# Patient Record
Sex: Male | Born: 1935
Health system: Southern US, Community
[De-identification: ages and names within clinical notes are randomized; demographics above are authoritative.]

## PROBLEM LIST (undated history)

## (undated) DIAGNOSIS — I10 Essential (primary) hypertension: Secondary | ICD-10-CM

## (undated) DIAGNOSIS — C801 Malignant (primary) neoplasm, unspecified: Secondary | ICD-10-CM

## (undated) DIAGNOSIS — I4891 Unspecified atrial fibrillation: Secondary | ICD-10-CM

## (undated) HISTORY — DX: Unspecified atrial fibrillation: I48.91

---

## 2018-03-30 HISTORY — PX: ESOPHAGOGASTRODUODENOSCOPY: SHX1529

## 2019-04-21 HISTORY — PX: TRANSURETHRAL RESECTION OF PROSTATE: SHX73

## 2019-09-01 ENCOUNTER — Other Ambulatory Visit: Payer: Self-pay

## 2019-09-01 ENCOUNTER — Emergency Department (HOSPITAL_COMMUNITY): Payer: Medicare Other

## 2019-09-01 ENCOUNTER — Encounter (HOSPITAL_COMMUNITY): Payer: Self-pay | Admitting: Emergency Medicine

## 2019-09-01 ENCOUNTER — Inpatient Hospital Stay (HOSPITAL_COMMUNITY)
Admission: EM | Admit: 2019-09-01 | Discharge: 2019-09-07 | DRG: 177 | Disposition: A | Discharge status: Home Health | Payer: Medicare Other | Attending: Internal Medicine | Admitting: Internal Medicine

## 2019-09-01 DIAGNOSIS — J189 Pneumonia, unspecified organism: Secondary | ICD-10-CM

## 2019-09-01 DIAGNOSIS — R0600 Dyspnea, unspecified: Secondary | ICD-10-CM

## 2019-09-01 DIAGNOSIS — Z79899 Other long term (current) drug therapy: Secondary | ICD-10-CM | POA: Diagnosis not present

## 2019-09-01 DIAGNOSIS — Z885 Allergy status to narcotic agent status: Secondary | ICD-10-CM | POA: Diagnosis not present

## 2019-09-01 DIAGNOSIS — Z7901 Long term (current) use of anticoagulants: Secondary | ICD-10-CM

## 2019-09-01 DIAGNOSIS — D509 Iron deficiency anemia, unspecified: Secondary | ICD-10-CM | POA: Diagnosis present

## 2019-09-01 DIAGNOSIS — Z85828 Personal history of other malignant neoplasm of skin: Secondary | ICD-10-CM

## 2019-09-01 DIAGNOSIS — R0902 Hypoxemia: Secondary | ICD-10-CM

## 2019-09-01 DIAGNOSIS — E039 Hypothyroidism, unspecified: Secondary | ICD-10-CM | POA: Diagnosis present

## 2019-09-01 DIAGNOSIS — Z87891 Personal history of nicotine dependence: Secondary | ICD-10-CM

## 2019-09-01 DIAGNOSIS — M109 Gout, unspecified: Secondary | ICD-10-CM | POA: Diagnosis present

## 2019-09-01 DIAGNOSIS — J9601 Acute respiratory failure with hypoxia: Secondary | ICD-10-CM | POA: Diagnosis present

## 2019-09-01 DIAGNOSIS — J1282 Pneumonia due to coronavirus disease 2019: Secondary | ICD-10-CM | POA: Diagnosis not present

## 2019-09-01 DIAGNOSIS — I4891 Unspecified atrial fibrillation: Secondary | ICD-10-CM | POA: Diagnosis present

## 2019-09-01 DIAGNOSIS — I10 Essential (primary) hypertension: Secondary | ICD-10-CM

## 2019-09-01 DIAGNOSIS — U071 COVID-19: Secondary | ICD-10-CM | POA: Diagnosis not present

## 2019-09-01 DIAGNOSIS — I480 Paroxysmal atrial fibrillation: Secondary | ICD-10-CM | POA: Diagnosis present

## 2019-09-01 HISTORY — DX: Essential (primary) hypertension: I10

## 2019-09-01 HISTORY — DX: Malignant (primary) neoplasm, unspecified: C80.1

## 2019-09-01 LAB — CBC WITH DIFFERENTIAL/PLATELET
Abs Immature Granulocytes: 0.07 10*3/uL (ref 0.00–0.07)
Basophils Absolute: 0 10*3/uL (ref 0.0–0.1)
Basophils Relative: 0 %
Eosinophils Absolute: 0 10*3/uL (ref 0.0–0.5)
Eosinophils Relative: 0 %
HCT: 31.4 % — ABNORMAL LOW (ref 39.0–52.0)
Hemoglobin: 9.5 g/dL — ABNORMAL LOW (ref 13.0–17.0)
Immature Granulocytes: 1 %
Lymphocytes Relative: 6 %
Lymphs Abs: 0.6 10*3/uL — ABNORMAL LOW (ref 0.7–4.0)
MCH: 24.8 pg — ABNORMAL LOW (ref 26.0–34.0)
MCHC: 30.3 g/dL (ref 30.0–36.0)
MCV: 82 fL (ref 80.0–100.0)
Monocytes Absolute: 0.2 10*3/uL (ref 0.1–1.0)
Monocytes Relative: 2 %
Neutro Abs: 9.7 10*3/uL — ABNORMAL HIGH (ref 1.7–7.7)
Neutrophils Relative %: 91 %
Platelets: 224 10*3/uL (ref 150–400)
RBC: 3.83 MIL/uL — ABNORMAL LOW (ref 4.22–5.81)
RDW: 16.3 % — ABNORMAL HIGH (ref 11.5–15.5)
WBC: 10.7 10*3/uL — ABNORMAL HIGH (ref 4.0–10.5)
nRBC: 0 % (ref 0.0–0.2)

## 2019-09-01 LAB — FIBRINOGEN: Fibrinogen: 722 mg/dL — ABNORMAL HIGH (ref 210–475)

## 2019-09-01 LAB — COMPREHENSIVE METABOLIC PANEL
ALT: 22 U/L (ref 0–44)
AST: 43 U/L — ABNORMAL HIGH (ref 15–41)
Albumin: 3.9 g/dL (ref 3.5–5.0)
Alkaline Phosphatase: 128 U/L — ABNORMAL HIGH (ref 38–126)
Anion gap: 10 (ref 5–15)
BUN: 13 mg/dL (ref 8–23)
CO2: 24 mmol/L (ref 22–32)
Calcium: 8.7 mg/dL — ABNORMAL LOW (ref 8.9–10.3)
Chloride: 97 mmol/L — ABNORMAL LOW (ref 98–111)
Creatinine, Ser: 0.89 mg/dL (ref 0.61–1.24)
GFR calc Af Amer: >60 mL/min (ref >60)
GFR calc non Af Amer: >60 mL/min (ref >60)
Glucose, Bld: 101 mg/dL — ABNORMAL HIGH (ref 70–99)
Potassium: 3.5 mmol/L (ref 3.5–5.1)
Sodium: 131 mmol/L — ABNORMAL LOW (ref 135–145)
Total Bilirubin: 0.8 mg/dL (ref 0.3–1.2)
Total Protein: 7.5 g/dL (ref 6.5–8.1)

## 2019-09-01 LAB — PROCALCITONIN: Procalcitonin: 0.28 ng/mL

## 2019-09-01 LAB — D-DIMER, QUANTITATIVE: D-Dimer, Quant: 2.92 ug/mL-FEU — ABNORMAL HIGH (ref 0.00–0.50)

## 2019-09-01 LAB — C-REACTIVE PROTEIN: CRP: 25.8 mg/dL — ABNORMAL HIGH (ref <1.0)

## 2019-09-01 LAB — RESPIRATORY PANEL BY RT PCR (FLU A&B, COVID)
Influenza A by PCR: NEGATIVE
Influenza B by PCR: NEGATIVE
SARS Coronavirus 2 by RT PCR: POSITIVE — AB

## 2019-09-01 LAB — LACTIC ACID, PLASMA
Lactic Acid, Venous: 1.4 mmol/L (ref 0.5–1.9)
Lactic Acid, Venous: 1.2 mmol/L (ref 0.5–1.9)

## 2019-09-01 LAB — FERRITIN: Ferritin: 59 ng/mL (ref 24–336)

## 2019-09-01 LAB — LACTATE DEHYDROGENASE: LDH: 226 U/L — ABNORMAL HIGH (ref 98–192)

## 2019-09-01 LAB — TRIGLYCERIDES: Triglycerides: 51 mg/dL (ref <150)

## 2019-09-01 MED ORDER — AZITHROMYCIN 250 MG PO TABS
500.0000 mg | ORAL_TABLET | Freq: Once | ORAL | Status: AC
  Administered 2019-09-01: 500 mg via ORAL
  Filled 2019-09-01: qty 2

## 2019-09-01 MED ORDER — VANCOMYCIN HCL 2000 MG/400ML IV SOLN
2000.0000 mg | Freq: Once | INTRAVENOUS | Status: AC
  Administered 2019-09-02: 2000 mg via INTRAVENOUS
  Filled 2019-09-01: qty 400

## 2019-09-01 MED ORDER — POLYETHYLENE GLYCOL 3350 17 G PO PACK: 17.0000 g | PACK | Freq: Every day | ORAL | Status: DC | PRN

## 2019-09-01 MED ORDER — LEVOTHYROXINE SODIUM 50 MCG PO TABS
50.0000 ug | ORAL_TABLET | Freq: Every day | ORAL | Status: DC
  Administered 2019-09-02 – 2019-09-07 (×6): 50 ug via ORAL
  Filled 2019-09-01 (×6): qty 1

## 2019-09-01 MED ORDER — ENSURE ENLIVE PO LIQD
237.0000 mL | Freq: Two times a day (BID) | ORAL | Status: DC
  Administered 2019-09-02 (×2): 237 mL via ORAL

## 2019-09-01 MED ORDER — PANTOPRAZOLE SODIUM 40 MG PO TBEC
40.0000 mg | DELAYED_RELEASE_TABLET | Freq: Every day | ORAL | Status: DC
  Administered 2019-09-02 – 2019-09-07 (×6): 40 mg via ORAL
  Filled 2019-09-01 (×6): qty 1

## 2019-09-01 MED ORDER — ZINC SULFATE 220 (50 ZN) MG PO CAPS
220.0000 mg | ORAL_CAPSULE | Freq: Every day | ORAL | Status: DC
  Administered 2019-09-02 – 2019-09-07 (×6): 220 mg via ORAL
  Filled 2019-09-01 (×6): qty 1

## 2019-09-01 MED ORDER — PROMETHAZINE HCL 25 MG PO TABS
12.5000 mg | ORAL_TABLET | Freq: Four times a day (QID) | ORAL | Status: DC | PRN
  Filled 2019-09-01: qty 1

## 2019-09-01 MED ORDER — RIVAROXABAN 20 MG PO TABS
20.0000 mg | ORAL_TABLET | Freq: Every day | ORAL | Status: DC
  Administered 2019-09-02 – 2019-09-06 (×5): 20 mg via ORAL
  Filled 2019-09-01 (×7): qty 1

## 2019-09-01 MED ORDER — ACETAMINOPHEN 500 MG PO TABS
1000.0000 mg | ORAL_TABLET | Freq: Once | ORAL | Status: AC
  Administered 2019-09-01: 1000 mg via ORAL
  Filled 2019-09-01: qty 2

## 2019-09-01 MED ORDER — DM-GUAIFENESIN ER 30-600 MG PO TB12
1.0000 | ORAL_TABLET | Freq: Two times a day (BID) | ORAL | Status: DC
  Administered 2019-09-01 – 2019-09-07 (×12): 1 via ORAL
  Filled 2019-09-01 (×13): qty 1

## 2019-09-01 MED ORDER — SODIUM CHLORIDE 0.9 % IV SOLN
1.0000 g | Freq: Once | INTRAVENOUS | Status: AC
  Administered 2019-09-01: 17:00:00 1 g via INTRAVENOUS
  Filled 2019-09-01: qty 10

## 2019-09-01 MED ORDER — ALLOPURINOL 100 MG PO TABS
300.0000 mg | ORAL_TABLET | Freq: Every day | ORAL | Status: DC
  Administered 2019-09-02 – 2019-09-07 (×6): 300 mg via ORAL
  Filled 2019-09-01 (×7): qty 3

## 2019-09-01 MED ORDER — SODIUM CHLORIDE 0.9 % IV SOLN
100.0000 mg | Freq: Every day | INTRAVENOUS | Status: AC
  Administered 2019-09-02 – 2019-09-05 (×4): 100 mg via INTRAVENOUS
  Filled 2019-09-01 (×5): qty 20

## 2019-09-01 MED ORDER — ASCORBIC ACID 500 MG PO TABS
500.0000 mg | ORAL_TABLET | Freq: Every day | ORAL | Status: DC
  Administered 2019-09-02 – 2019-09-07 (×6): 500 mg via ORAL
  Filled 2019-09-01 (×6): qty 1

## 2019-09-01 MED ORDER — DEXAMETHASONE SODIUM PHOSPHATE 10 MG/ML IJ SOLN
10.0000 mg | Freq: Once | INTRAMUSCULAR | Status: AC
  Administered 2019-09-01: 10 mg via INTRAVENOUS
  Filled 2019-09-01: qty 1

## 2019-09-01 MED ORDER — ALBUTEROL SULFATE HFA 108 (90 BASE) MCG/ACT IN AERS
2.0000 | INHALATION_SPRAY | Freq: Four times a day (QID) | RESPIRATORY_TRACT | Status: DC
  Administered 2019-09-02: 2 via RESPIRATORY_TRACT
  Filled 2019-09-01: qty 6.7

## 2019-09-01 MED ORDER — SODIUM CHLORIDE 0.9 % IV SOLN
2.0000 g | Freq: Three times a day (TID) | INTRAVENOUS | Status: DC
  Administered 2019-09-01 – 2019-09-02 (×2): 2 g via INTRAVENOUS
  Filled 2019-09-01 (×2): qty 2

## 2019-09-01 MED ORDER — SODIUM CHLORIDE 0.9 % IV SOLN
200.0000 mg | Freq: Once | INTRAVENOUS | Status: AC
  Administered 2019-09-01: 19:00:00 200 mg via INTRAVENOUS
  Filled 2019-09-01: qty 40

## 2019-09-01 MED ORDER — DEXAMETHASONE SODIUM PHOSPHATE 10 MG/ML IJ SOLN
6.0000 mg | INTRAMUSCULAR | Status: DC
  Administered 2019-09-02 – 2019-09-06 (×5): 6 mg via INTRAVENOUS
  Filled 2019-09-01 (×6): qty 1

## 2019-09-01 MED ORDER — TOCILIZUMAB 400 MG/20ML IV SOLN
700.0000 mg | Freq: Once | INTRAVENOUS | Status: AC
  Administered 2019-09-01: 22:00:00 700 mg via INTRAVENOUS
  Filled 2019-09-01: qty 35

## 2019-09-01 MED ORDER — AMLODIPINE BESYLATE 10 MG PO TABS
10.0000 mg | ORAL_TABLET | Freq: Every day | ORAL | Status: DC
  Administered 2019-09-02 – 2019-09-07 (×6): 10 mg via ORAL
  Filled 2019-09-01 (×6): qty 1
  Filled 2019-09-01: qty 2

## 2019-09-01 NOTE — ED Notes (Signed)
Patient O2 at 84 to 86% on 4 L via [place].  Dr. Tomi Bamberger informed.

## 2019-09-01 NOTE — Progress Notes (Signed)
Mr. Lucchese was transferred to [facility] room 167 this evening at 2140.  No acute changes en route per [facility].  Upon arrival, [name] was on NRB.  O2 titrated down to 5 L [place] and resting comfortably in bed.

## 2019-09-01 NOTE — ED Notes (Signed)
Have paged respiratory due to low O2 sats. 84-86% on 4L [place].

## 2019-09-01 NOTE — ED Provider Notes (Signed)
[facility] EMERGENCY DEPARTMENT Provider Note   CSN: [id number] Arrival date & time: 09/01/19  1356     History Chief Complaint  Patient presents with  . Fever  . Shortness of Breath    Steven Gutierrez is a 86 y.o. male.  HPI   Pt presents with worsening shortness of breath, fever, cough.  Pt states he started having symptom over a week ago.  Initially it was cough, nasal congestion.  His sx increased so he was seen at a hospital in [place].  He was diagnosed with pna and tested negative for covid.  Pt states today he felt worse.  He could only walk a few steps without getting short of breath.  He also has pain with coughing.  No vomiting or diarrhea.  No use of oxygen at home.  No hx of lung problems.  Past Medical History:  Diagnosis Date  . Cancer ([place])    skin cancer  . Hypertension     Patient Active Problem List   Diagnosis Date Noted  . Pneumonia due to COVID-19 virus 09/01/2019    History reviewed. No pertinent surgical history.     No family history on file.  Social History   Tobacco Use  . Smoking status: Former Research scientist (life sciences)  . Smokeless tobacco: Never Used  Substance Use Topics  . Alcohol use: Not Currently  . Drug use: Never    Home Medications Prior to Admission medications   Not on File    Allergies    Oxycodone  Review of Systems   Review of Systems  All other systems reviewed and are negative.   Physical Exam Updated Vital Signs BP 136/68   Pulse 88   Temp (!) 100.8 F (38.2 C) (Oral)   Resp (!) 32   Ht 1.829 m (6')   Wt 88.5 kg   SpO2 93%   BMI 26.45 kg/m   Physical Exam Vitals and nursing note reviewed.  Constitutional:      Appearance: He is well-developed. He is not toxic-appearing or diaphoretic.  HENT:     Head: Normocephalic and atraumatic.     Right Ear: External ear normal.     Left Ear: External ear normal.  Eyes:     General: No scleral icterus.       Right eye: No discharge.        Left eye: No discharge.   Conjunctiva/sclera: Conjunctivae normal.  Neck:     Trachea: No tracheal deviation.  Cardiovascular:     Rate and Rhythm: Normal rate and regular rhythm.  Pulmonary:     Effort: Pulmonary effort is normal. No respiratory distress.     Breath sounds: Normal breath sounds. No stridor. No wheezing or rales.  Abdominal:     General: Bowel sounds are normal. There is no distension.     Palpations: Abdomen is soft.     Tenderness: There is no abdominal tenderness. There is no guarding or rebound.  Musculoskeletal:        General: No tenderness.     Cervical back: Neck supple.  Skin:    General: Skin is warm and dry.     Findings: No rash.  Neurological:     Mental Status: He is alert.     Cranial Nerves: No cranial nerve deficit (no facial droop, extraocular movements intact, no slurred speech).     Sensory: No sensory deficit.     Motor: No abnormal muscle tone or seizure activity.     Coordination: Coordination  normal.     ED Results / Procedures / Treatments   Labs (all labs ordered are listed, but only abnormal results are displayed) Labs Reviewed  RESPIRATORY PANEL BY RT PCR (FLU A&B, COVID) - Abnormal; Notable for the following components:      Result Value   SARS Coronavirus 2 by RT PCR POSITIVE (*)    All other components within normal limits  CBC WITH DIFFERENTIAL/PLATELET - Abnormal; Notable for the following components:   WBC 10.7 (*)    RBC 3.83 (*)    Hemoglobin 9.5 (*)    HCT 31.4 (*)    MCH 24.8 (*)    RDW 16.3 (*)    Neutro Abs 9.7 (*)    Lymphs Abs 0.6 (*)    All other components within normal limits  COMPREHENSIVE METABOLIC PANEL - Abnormal; Notable for the following components:   Sodium 131 (*)    Chloride 97 (*)    Glucose, Bld 101 (*)    Calcium 8.7 (*)    AST 43 (*)    Alkaline Phosphatase 128 (*)    All other components within normal limits  D-DIMER, QUANTITATIVE (NOT AT [facility]) - Abnormal; Notable for the following components:   D-Dimer, Quant  2.92 (*)    All other components within normal limits  LACTATE DEHYDROGENASE - Abnormal; Notable for the following components:   LDH 226 (*)    All other components within normal limits  FIBRINOGEN - Abnormal; Notable for the following components:   Fibrinogen 722 (*)    All other components within normal limits  C-REACTIVE PROTEIN - Abnormal; Notable for the following components:   CRP 25.8 (*)    All other components within normal limits  CULTURE, BLOOD (ROUTINE X 2)  CULTURE, BLOOD (ROUTINE X 2)  LACTIC ACID, PLASMA  LACTIC ACID, PLASMA  PROCALCITONIN  FERRITIN  TRIGLYCERIDES    EKG EKG Interpretation  Date/Time:  Tuesday September 01 2019 14:11:13 EST Ventricular Rate:  79 PR Interval:    QRS Duration: 103 QT Interval:  420 QTC Calculation: 482 R Axis:   78 Text Interpretation: Undetermined rhythm RSR' in V1 or V2, probably normal variant Borderline prolonged QT interval No old tracing to compare Confirmed by Dorie Rank [phone]) on 09/01/2019 5:01:34 PM   Radiology DG Chest Port 1 View  Result Date: 09/01/2019 CLINICAL DATA:  Dyspnea. EXAM: PORTABLE CHEST 1 VIEW COMPARISON:  None. FINDINGS: Moderate severity patchy infiltrate is seen within the mid left lung with mild infiltrates seen within the mid to lower right lung and left lung base. There is no evidence of a pleural effusion or pneumothorax. The cardiac silhouette is borderline in size. There is moderate severity calcification of the aortic arch. The visualized skeletal structures are unremarkable. IMPRESSION: 1. Moderate severity bilateral infiltrates, left greater than right. Electronically Signed   By: Virgina Norfolk M.D.   On: 09/01/2019 15:56    Procedures .Critical Care Performed by: Dorie Rank, MD Authorized by: Dorie Rank, MD   Critical care provider statement:    Critical care time (minutes):  45   Critical care was time spent personally by me on the following activities:  Discussions with consultants,  evaluation of patient's response to treatment, examination of patient, ordering and performing treatments and interventions, ordering and review of laboratory studies, ordering and review of radiographic studies, pulse oximetry, re-evaluation of patient's condition, obtaining history from patient or surrogate and review of old charts   (including critical care time)  Medications Ordered  in ED Medications  dexamethasone (DECADRON) injection 10 mg (has no administration in time range)  cefTRIAXone (ROCEPHIN) 1 g in sodium chloride 0.9 % 100 mL IVPB (0 g Intravenous Stopped 09/01/19 1705)  azithromycin (ZITHROMAX) tablet 500 mg (500 mg Oral Given 09/01/19 1636)  acetaminophen (TYLENOL) tablet 1,000 mg (1,000 mg Oral Given 09/01/19 1826)    ED Course  I have reviewed the triage vital signs and the nursing notes.  Pertinent labs & imaging results that were available during my care of the patient were reviewed by me and considered in my medical decision making (see chart for details).  Clinical Course as of Aug 31 1825  Tue Sep 01, 2019  1523 Vitals reviewed.  O2 sat decreased to 88%.  Improved with [place] oxygen   [JK]  1700 Chest x-ray shows bilateral infiltrates.   [JK]  1700 Labs notable for leukocytosis and anemia.  Inflammatory markers fibrinogen, D-dimer and LDH elevated   [JK]  1826 Covid test positive.  Decadron and remdesivir ordered.  Case discussed with Dr Denton Brick   [JK]    Clinical Course User Index [JK] Dorie Rank, MD   MDM Rules/Calculators/A&P                     Patient presented to the ED for evaluation of pneumonia and persistent shortness of breath.  Patient was tested for Covid and was told he was negative.  He has bilateral pneumonia in a pattern that is concerning for Covid pneumonia.  I have repeated his test here.  Inflammatory markers are elevated consistent with his infection.  I doubt pulmonary embolism based on his presentation and his abnormal chest x-ray finding would  account for his hypoxia.  Patient was started on empiric antibiotics.  He does continue to have an oxygen requirement and is in the high 80s on 4 L of nasal cannula oxygen.  We will continue to monitor.  I will consult with the medical service for admission and further treatment. Final Clinical Impression(s) / ED Diagnoses Final diagnoses:  Pneumonia of both lungs due to infectious organism, unspecified part of lung  Hypoxia  Pneumonia due to COVID-19 virus      Dorie Rank, MD 09/01/19 1827

## 2019-09-01 NOTE — ED Triage Notes (Signed)
Patient states shortness of breath and fever x 4 days. States diagnosed with pneumonia a week ago and finished antibiotics this morning. States he can only take a few steps with becoming winded. States fever and shortness of breath have increased over the past 2 days.

## 2019-09-01 NOTE — ED Notes (Signed)
Patient give permission to share all information with his son Harbert Demichele [id number].  Family updated.

## 2019-09-01 NOTE — Progress Notes (Signed)
Pharmacy Antibiotic Note  Steven Gutierrez is a 86 y.o. male admitted on 09/01/2019 withCoVid-19 pneumonia.  Pharmacy has been consulted for vancomycin and cefepime dosing.  Plan: Start cefepime 2g IV q8h Loading dose:  vancomycin 2g  IV x1 dose Maintenance dose: vancomycin  1g IV  q12 h Calculated AUC: 457.6 mcg*h/mL Pharmacy will continue to monitor renal function, vancomycin levels as clinically appropriate,  cultures and patient progress.  Height: 6' (182.9 cm) Weight: 195 lb (88.5 kg) IBW/kg (Calculated) : 77.6  Temp (24hrs), Avg:100.8 F (38.2 C), Min:100.8 F (38.2 C), Max:100.8 F (38.2 C)  Recent Labs  Lab 09/01/19 1522 09/01/19 1523 09/01/19 1709  WBC  --  10.7*  --   CREATININE  --  0.89  --   LATICACIDVEN 1.4  --  1.2    Estimated Creatinine Clearance: 67.8 mL/min (by C-G formula based on SCr of 0.89 mg/dL).    Allergies  Allergen Reactions  . Oxycodone Nausea And Vomiting    Antimicrobials this admission: Vancomycin 2/2 >>   Cefepime 2/2>>   Ceftriaxone 2/2>>2/2 Azithromycin 2/2>>2/2 remdesivir 2/2>>2/6 Actemra 2/2>>   Microbiology results: 2/2 BC x2:  2/2 Resp PCR: SARS CoV-2 positive; Flu A/B negative 2/2 MRSA PCR:    Thank you for allowing pharmacy to be a part of this patient's care.  Despina Pole 09/01/2019 10:49 PM

## 2019-09-01 NOTE — ED Notes (Signed)
Date and time results received: 09/01/19 1811 (use smartphrase ".now" to insert current time)  Test: COVID Critical Value:  POSITIVE  Name of Provider Notified: Tomi Bamberger  Orders Received? Or Actions Taken?: Orders Received - See Orders for details

## 2019-09-01 NOTE — ED Notes (Signed)
Pt changed from HF [place] to 15L non rebreather per MD order for transport to [facility].

## 2019-09-01 NOTE — H&P (Signed)
History and Physical    Steven Gutierrez [id number] DOB: Dec 31, 1932 DOA: 09/01/2019  PCP: [name], MD   Patient coming from: Home  I have personally briefly reviewed patient's old medical records in [place]  Chief Complaint: SOB, Cough  HPI: Steven Gutierrez is a 86 y.o. male with medical history significant for atrial fibrillation, hypertension, skin cancer.  Presented to the ED with complaints of difficulty breathing, fevers with cough and nasal congestion, progressive over the past week.  He presented to a [place], was diagnosed with pneumonia and tested negative for Covid.  He was given some antibiotics he does not know the name. Patient lives alone.  I talked to patient's son Chan Boggs, patient went to [facility] ED in [place] on Monday- Jan 25th, for left side pain was diagnosed with pleurisy and told he had fluid in both lungs.  Covid test was negative. He was started on Augmentin, which patient's took twice a day last dose was today.  On Sunday- 1/31, he was back in the same ED with complaints of fevers, difficulty breathing and cough productive of yellowish phlegm.  Was told he had pneumonia and to complete his antibiotics.  ED Course: Temperature 100.8.  Tachycardic to 34, O2 sats initially 88% on room air, but with increasing hypoxia patient was placed on high flow nasal cannula 11 L.  Procalcitonin elevated at 0.28.  Elevation in inflammatory markers LDH, CRP 25.8, fibrinogen and D-dimer.  Respiratory panel positive for COVID-19 infection negative for influenza.  Chest x-ray showed moderate severity bilateral infiltrates L  > R.  IV ceftriaxone and azithromycin started in ED.  Hospitalist to admit for COVID-19 pneumonia.  Review of Systems: As per HPI all other systems reviewed and negative.  Past Medical History:  Diagnosis Date  . Cancer ([place])    skin cancer  . Hypertension     History reviewed. No pertinent surgical history.   reports that he has quit  smoking. He has never used smokeless tobacco. He reports previous alcohol use. He reports that he does not use drugs.  Allergies  Allergen Reactions  . Oxycodone Nausea And Vomiting   Family history of hypertension.  Prior to Admission medications   Not on File    Physical Exam: Vitals:   09/01/19 1915 09/01/19 1930 09/01/19 1945 09/01/19 2000  BP:  130/81  118/74  Pulse: 96 95 95   Resp: (!) 31 (!) 22 19 (!) 23  Temp:      TempSrc:      SpO2: 96% 93% 92%   Weight:      Height:        Constitutional: NAD, calm, comfortable Vitals:   09/01/19 1915 09/01/19 1930 09/01/19 1945 09/01/19 2000  BP:  130/81  118/74  Pulse: 96 95 95   Resp: (!) 31 (!) 22 19 (!) 23  Temp:      TempSrc:      SpO2: 96% 93% 92%   Weight:      Height:       Eyes: PERRL, lids and conjunctivae normal ENMT: Mucous membranes are moist. Posterior pharynx clear of any exudate or lesions. Neck: normal, supple, no masses, no thyromegaly Respiratory: Normal respiratory effort. No accessory muscle use, Considering the degree of hypoxia Cardiovascular: Regular rate and rhythm, No extremity edema. 2+ pedal pulses.  Abdomen: no tenderness, no masses palpated. No hepatosplenomegaly. Bowel sounds positive.  Musculoskeletal: no clubbing / cyanosis. No joint deformity upper and lower extremities.  Skin: no  rashes, lesions, ulcers. No induration Neurologic: No gross cranial nerve abnormality, moving all extremities spontaneously. Psychiatric: Normal judgment and insight. Alert and oriented x 3. Normal mood.   Labs on Admission: I have personally reviewed following labs and imaging studies  CBC: Recent Labs  Lab 09/01/19 1523  WBC 10.7*  NEUTROABS 9.7*  HGB 9.5*  HCT 31.4*  MCV 82.0  PLT [id number]   Basic Metabolic Panel: Recent Labs  Lab 09/01/19 1523  NA 131*  K 3.5  CL 97*  CO2 24  GLUCOSE 101*  BUN 13  CREATININE 0.89  CALCIUM 8.7*   Liver Function Tests: Recent Labs  Lab 09/01/19 1523    AST 43*  ALT 22  ALKPHOS 128*  BILITOT 0.8  PROT 7.5  ALBUMIN 3.9   Lipid Profile: Recent Labs    09/01/19 1523  TRIG 51   Anemia Panel: Recent Labs    09/01/19 1523  FERRITIN 59    Radiological Exams on Admission: DG Chest Port 1 View  Result Date: 09/01/2019 CLINICAL DATA:  Dyspnea. EXAM: PORTABLE CHEST 1 VIEW COMPARISON:  None. FINDINGS: Moderate severity patchy infiltrate is seen within the mid left lung with mild infiltrates seen within the mid to lower right lung and left lung base. There is no evidence of a pleural effusion or pneumothorax. The cardiac silhouette is borderline in size. There is moderate severity calcification of the aortic arch. The visualized skeletal structures are unremarkable. IMPRESSION: 1. Moderate severity bilateral infiltrates, left greater than right. Electronically Signed   By: Virgina Norfolk M.D.   On: 09/01/2019 15:56    EKG: Independently reviewed.  No discernible P waves, rhythm irregular.  No prior EKG to compare.  Care everywhere history of atrial fibrillation.  Assessment/Plan Active Problems:   Pneumonia due to COVID-19 virus    Acute respiratory failure 2/2 COVID-19 pneumonia- O2 sats initially  84 - 86% on 4 L nasal cannula, currently on high flow nasal cannula 11 L.  Chest x-ray showing bilateral infiltrates L > R.  Elevation in inflammatory markers-CRP 25.8.  Ferritin, LDH, D-dimer.  Procalcitonin 0.28.  Completed course of Augmentin. -IV dexamethasone 10 mg x 1, continue 6 mg daily -Remdesivir pharmacy to dose -Daily inflammatory markers, CBC, CMP -Can order rule out persistent or new bacterial coinfection, will start IV vancomycin and cefepime -Follow-up blood cultures -As needed albuterol inhaler, mucolytics -COVID-19 admission protocol -Respiratory therapy consult -Considering age, chest x-ray findings and degree of hypoxia, prognosis is poor. -Consents received from patient and from patient's son, to give  Actemra.  Atrial fibrillation- rate controlled and on anticoagulation with Xarelto.  Not on rate limiting drugs. -Resume home Xarelto  Hypertension-stable. -Resume home Norvasc 10 mg daily  Hx of Gout -Resume home allopurinol 300 mg daily  Hypothyroidism -Resume home Synthroid 38mcg daily  DVT prophylaxis: Xarelto Code Status: Full code-confirmed with patient at bedside and also with patient's son- Bark[place], Cl[place] on the phone. Family Communication: Talked to patient's older son Derico, Mccrea who is primary decision-maker, unsure if he has signed documents indicating HCPOA.  Contact Number [phone].  I have explained severity of illness, likelihood of further decompensation, poor prognosis. Disposition Plan: Pending improvement in respiratory status. Consults called: None Admission status: InPatient, stepdown I certify that at the point of admission it is my clinical judgment that the patient will require inpatient hospital care spanning beyond 2 midnights from the point of admission due to high intensity of service, high risk for further deterioration and high  frequency of surveillance required. The following factors support the patient status of inpatient: Acute respiratory failure requiring high amounts of supplemental O2.   Bethena Roys MD [facility]  09/01/2019, 8:43 PM

## 2019-09-01 NOTE — Progress Notes (Signed)
Called patient's son [name] to let him now that the patient has arrived and has been settled.  Currently sats are 94% on 5L [place] and he is comfortable in the bed.

## 2019-09-02 LAB — COMPREHENSIVE METABOLIC PANEL
ALT: 21 U/L (ref 0–44)
AST: 40 U/L (ref 15–41)
Albumin: 3.5 g/dL (ref 3.5–5.0)
Alkaline Phosphatase: 113 U/L (ref 38–126)
Anion gap: 12 (ref 5–15)
BUN: 16 mg/dL (ref 8–23)
CO2: 23 mmol/L (ref 22–32)
Calcium: 8.7 mg/dL — ABNORMAL LOW (ref 8.9–10.3)
Chloride: 102 mmol/L (ref 98–111)
Creatinine, Ser: 1.12 mg/dL (ref 0.61–1.24)
GFR calc Af Amer: >60 mL/min (ref >60)
GFR calc non Af Amer: 60 mL/min — ABNORMAL LOW (ref >60)
Glucose, Bld: 172 mg/dL — ABNORMAL HIGH (ref 70–99)
Potassium: 3.8 mmol/L (ref 3.5–5.1)
Sodium: 137 mmol/L (ref 135–145)
Total Bilirubin: 0.7 mg/dL (ref 0.3–1.2)
Total Protein: 7.3 g/dL (ref 6.5–8.1)

## 2019-09-02 LAB — CBC WITH DIFFERENTIAL/PLATELET
Abs Immature Granulocytes: 0.09 K/uL — ABNORMAL HIGH (ref 0.00–0.07)
Basophils Absolute: 0 K/uL (ref 0.0–0.1)
Basophils Relative: 0 %
Eosinophils Absolute: 0 K/uL (ref 0.0–0.5)
Eosinophils Relative: 0 %
HCT: 31.1 % — ABNORMAL LOW (ref 39.0–52.0)
Hemoglobin: 9.4 g/dL — ABNORMAL LOW (ref 13.0–17.0)
Immature Granulocytes: 1 %
Lymphocytes Relative: 4 %
Lymphs Abs: 0.4 K/uL — ABNORMAL LOW (ref 0.7–4.0)
MCH: 24.7 pg — ABNORMAL LOW (ref 26.0–34.0)
MCHC: 30.2 g/dL (ref 30.0–36.0)
MCV: 81.6 fL (ref 80.0–100.0)
Monocytes Absolute: 0.1 K/uL (ref 0.1–1.0)
Monocytes Relative: 1 %
Neutro Abs: 9 K/uL — ABNORMAL HIGH (ref 1.7–7.7)
Neutrophils Relative %: 94 %
Platelets: 234 K/uL (ref 150–400)
RBC: 3.81 MIL/uL — ABNORMAL LOW (ref 4.22–5.81)
RDW: 16.3 % — ABNORMAL HIGH (ref 11.5–15.5)
WBC: 9.7 K/uL (ref 4.0–10.5)
nRBC: 0 % (ref 0.0–0.2)

## 2019-09-02 LAB — D-DIMER, QUANTITATIVE: D-Dimer, Quant: 2.37 ug{FEU}/mL — ABNORMAL HIGH (ref 0.00–0.50)

## 2019-09-02 LAB — C-REACTIVE PROTEIN: CRP: 24.6 mg/dL — ABNORMAL HIGH

## 2019-09-02 LAB — MRSA PCR SCREENING: MRSA by PCR: NEGATIVE

## 2019-09-02 LAB — FERRITIN: Ferritin: 64 ng/mL (ref 24–336)

## 2019-09-02 LAB — ABO/RH: ABO/RH(D): A NEG

## 2019-09-02 MED ORDER — SENNOSIDES-DOCUSATE SODIUM 8.6-50 MG PO TABS: 2.0000 | ORAL_TABLET | Freq: Every evening | ORAL | Status: DC | PRN

## 2019-09-02 MED ORDER — ENSURE ENLIVE PO LIQD
237.0000 mL | Freq: Three times a day (TID) | ORAL | Status: DC
  Administered 2019-09-02 – 2019-09-07 (×12): 237 mL via ORAL

## 2019-09-02 MED ORDER — VANCOMYCIN HCL 1500 MG/300ML IV SOLN: 1500.0000 mg | INTRAVENOUS | Status: DC

## 2019-09-02 MED ORDER — NYSTATIN 100000 UNIT/ML MT SUSP
5.0000 mL | Freq: Four times a day (QID) | OROMUCOSAL | Status: DC
  Administered 2019-09-02 – 2019-09-07 (×22): 500000 [IU] via OROMUCOSAL
  Filled 2019-09-02 (×26): qty 5

## 2019-09-02 MED ORDER — VANCOMYCIN HCL IN DEXTROSE 1-5 GM/200ML-% IV SOLN
1000.0000 mg | Freq: Two times a day (BID) | INTRAVENOUS | Status: DC
  Filled 2019-09-02: qty 200

## 2019-09-02 MED ORDER — SODIUM CHLORIDE 0.9 % IV SOLN
2.0000 g | Freq: Two times a day (BID) | INTRAVENOUS | Status: DC
  Administered 2019-09-02 – 2019-09-03 (×2): 2 g via INTRAVENOUS
  Filled 2019-09-02 (×2): qty 2

## 2019-09-02 MED ORDER — MELATONIN 3 MG PO TABS
6.0000 mg | ORAL_TABLET | Freq: Every evening | ORAL | Status: DC | PRN
  Administered 2019-09-02 – 2019-09-06 (×4): 6 mg via ORAL
  Filled 2019-09-02 (×6): qty 2

## 2019-09-02 MED ORDER — POLYETHYLENE GLYCOL 3350 17 G PO PACK: 17.0000 g | PACK | Freq: Every day | ORAL | Status: DC | PRN

## 2019-09-02 MED ORDER — TIMOLOL MALEATE 0.25 % OP SOLN
1.0000 [drp] | Freq: Every day | OPHTHALMIC | Status: DC
  Administered 2019-09-02 – 2019-09-07 (×6): 1 [drp] via OPHTHALMIC
  Filled 2019-09-02: qty 5

## 2019-09-02 MED ORDER — IPRATROPIUM-ALBUTEROL 20-100 MCG/ACT IN AERS
1.0000 | INHALATION_SPRAY | Freq: Four times a day (QID) | RESPIRATORY_TRACT | Status: DC
  Administered 2019-09-02 – 2019-09-07 (×19): 1 via RESPIRATORY_TRACT
  Filled 2019-09-02: qty 4

## 2019-09-02 NOTE — Progress Notes (Signed)
1433: Patient walked from room 169 to room 154 with RN and CNA. Patient on 3L [place]. Patient dropped his saturations during the walk to 80-82%. No s/s of distress. Patient quickly recovered with rest.  1620: Son [name] called and updated. Notified patient transferred from 169 to 154. All questions answered at this time.

## 2019-09-02 NOTE — Progress Notes (Signed)
Initial Nutrition Assessment  DOCUMENTATION CODES:   Not applicable  INTERVENTION:   Increase Ensure Enlive to TID, each supplement provides 350 kcal and 20 grams of protein.  Pt receiving Hormel Shake daily with Breakfast which provides 520 kcals and 22 g of protein and Magic cup BID with lunch and dinner, each supplement provides 290 kcal and 9 grams of protein, automatically on meal trays to optimize nutritional intake.   NUTRITION DIAGNOSIS:   Increased nutrient needs related to acute illness, catabolic illness(COVID) as evidenced by estimated needs.  GOAL:   Patient will meet greater than or equal to 90% of their needs  MONITOR:   PO intake, Supplement acceptance  REASON FOR ASSESSMENT:   Malnutrition Screening Tool    ASSESSMENT:   86 yo male admitted with progressive SOB x 1 week r/t COVID PNA. PMH includes A fib, HTN, skin cancer.   Patient is currently requiring 5 L oxygen via nasal cannula. He is on a heart healthy diet and consumed 100% of his meal/snack last night. No other meal intakes recorded. He has had 2 Ensure Enlive supplements so far today.   Labs reviewed.   Medications reviewed and include vitamin C, decadron, zinc sulfate, remdesivir.  No recent weights available for review.  On admission, patient reported weight loss and poor intake r/t poor appetite PTA.  Suspect PO intake will be suboptimal, given acute illness with difficulty breathing and increased nutrition needs for COVID.  NUTRITION - FOCUSED PHYSICAL EXAM:  unable to complete  Diet Order:   Diet Order            Diet Heart Room service appropriate? Yes; Fluid consistency: Thin  Diet effective now              EDUCATION NEEDS:   No education needs have been identified at this time  Skin:  Skin Assessment: Reviewed RN Assessment  Last BM:  2/2  Height:   Ht Readings from Last 1 Encounters:  09/01/19 6' (1.829 m)    Weight:   Wt Readings from Last 1 Encounters:   09/01/19 88.5 kg    Ideal Body Weight:  80.9 kg  BMI:  Body mass index is 26.45 kg/m.  Estimated Nutritional Needs:   Kcal:  2100-2400  Protein:  110-130 gm  Fluid:  >/= 2 L    Molli Barrows, RD, LDN, [place] Pager [phone] After Hours Pager [phone]

## 2019-09-02 NOTE — Progress Notes (Signed)
Pharmacy Antibiotic Note  Steven Gutierrez is a 86 y.o. male admitted on 09/01/2019 with COVID-19 pneumonia.  Pharmacy has been consulted for vancomycin and cefepime dosing due to concern for superimposed PNA.  The patient's SCr has trended up today to 1.12, estimated CrCl~50-55 ml/min. Will adjust antibiotic doses accordingly today.   Plan: - Adjust Cefepime to 2g IV every 12 hours - Adjust Vancomycin to 1500 mg IV every 24 hours (est AUC 479, Vd 0.72, SCr 1.12) - Consider d/cing Vancomycin with MRSA PCR negative - Will continue to follow renal function, culture results, LOT, and antibiotic de-escalation plans   Height: 6' (182.9 cm) Weight: 195 lb (88.5 kg) IBW/kg (Calculated) : 77.6  Temp (24hrs), Avg:98.5 F (36.9 C), Min:97.6 F (36.4 C), Max:100.8 F (38.2 C)  Recent Labs  Lab 09/01/19 1522 09/01/19 1523 09/01/19 1709 09/02/19 0225  WBC  --  10.7*  --  9.7  CREATININE  --  0.89  --  1.12  LATICACIDVEN 1.4  --  1.2  --     Estimated Creatinine Clearance: 53.9 mL/min (by C-G formula based on SCr of 1.12 mg/dL).    Allergies  Allergen Reactions  . Oxycodone Nausea And Vomiting    Antimicrobials this admission: Vancomycin 2/2 >>   Cefepime 2/2>>   Ceftriaxone 2/2>>2/2 Azithromycin 2/2>>2/2 remdesivir 2/2>>2/6 Actemra 2/2>>   Microbiology results: 2/2 BC x2:  2/2 Resp PCR: SARS CoV-2 positive; Flu A/B negative 2/2 MRSA PCR:    Thank you for allowing pharmacy to be a part of this patient's care.  Alycia Rossetti, PharmD, BCPS Clinical Pharmacist 09/02/2019 9:27 AM   **Pharmacist phone directory can now be found on [email or web address] (PW [email or web address]).  Listed under [place].

## 2019-09-02 NOTE — Plan of Care (Signed)

## 2019-09-02 NOTE — Progress Notes (Signed)
PROGRESS NOTE    Steven Gutierrez  [id number] DOB: 1933-03-30 DOA: 09/01/2019 PCP: [name], MD   Brief Narrative:  86 year old with history of atrial fibrillation, HTN, skin cancer came to the hospital with complaints of shortness of breath which had been ongoing for a week.  Tested negative for COVID-19 in the outpatient and was treated with Augmentin but due to progression of his symptoms he came to the hospital.  He was noted to be hypoxic in the ER requiring nonrebreather.  Chest x-ray showed bilateral infiltrates.  He was started on IV Rocephin and azithromycin and admitted to the hospital.  Upon admission he was placed on vancomycin and cefepime, Decadron and remdesivir.   Assessment & Plan:   Principal Problem:   Pneumonia due to COVID-19 virus Active Problems:   Unspecified atrial fibrillation (HCC)   Essential hypertension  Acute hypoxic respiratory failure secondary to COVID-19 pneumonia -Oxygen levels-5 L nasal cannula -Remdesivir-day 2 -Decadrone-day 2 -Routine: Labs have been reviewed including ferritin, LDH, CRP, d-dimer, fibrinogen.  Will need to trend this lab daily. -Vitamin C & Zinc. Prone >16hrs/day.  -procalcitonin-0.28 -MRSA screen negative, discontinue vancomycin -Chest x-ray-bilateral infiltrates -Supportive care-antitussive, inhalers, I-S/flutter -CODE STATUS confirmed -Received Actemra  Had extensive discussion with the patient regarding  emergent use of convalescent plasma given worsening of shortness of breath, oxygen requirement from hypoxia and elevation in inflammatory markers.  Denies any active/latent tuberculosis, hepatitis infection, active immunosuppressive state including chemotherapy.  No severe sepsis.  Patient and family understood and all the questions answered.  Agreed to proceed with it.  Atrial fibrillation -Rate controlled.  On Xarelto  Essential hypertension -Norvasc daily  History of  gout -Allopurinol  Hypothyroidism -Synthroid   DVT prophylaxis: Xarelto Code Status: Full code Family Communication: Spoke with the patient's son. Disposition Plan:   Patient From= home  Patient Anticipated D/C place= Home  Barriers= maintain hospital stay until he is on oxygen.  Currently on 5 L nasal cannula.  Not on any home oxygen.  Once weaned off will be discharged   Subjective: Feels better than yesterday but still has dyspnea on exertion  Review of Systems Otherwise negative except as per HPI, including: General: Denies fever, chills, night sweats or unintended weight loss. Resp: Denies hemoptysis Cardiac: Denies chest pain, palpitations, orthopnea, paroxysmal nocturnal dyspnea. GI: Denies abdominal pain, nausea, vomiting, diarrhea or constipation GU: Denies dysuria, frequency, hesitancy or incontinence MS: Denies muscle aches, joint pain or swelling Neuro: Denies headache, neurologic deficits (focal weakness, numbness, tingling), abnormal gait Psych: Denies anxiety, depression, SI/HI/AVH Skin: Denies new rashes or lesions ID: Denies sick contacts, exotic exposures, travel  Examination:  General exam: Appears calm and comfortable, 4 L nasal cannula Respiratory system: Bilateral rhonchi Cardiovascular system: S1 & S2 heard, RRR. No JVD, murmurs, rubs, gallops or clicks. No pedal edema. Gastrointestinal system: Abdomen is nondistended, soft and nontender. No organomegaly or masses felt. Normal bowel sounds heard. Central nervous system: Alert and oriented. No focal neurological deficits. Extremities: Symmetric 5 x 5 power. Skin: No rashes, lesions or ulcers Psychiatry: Judgement and insight appear normal. Mood & affect appropriate.     Objective: Vitals:   09/02/19 0800 09/02/19 0900 09/02/19 1000 09/02/19 1135  BP: 133/64   (!) 151/77  Pulse: 85 81 88 80  Resp: (!) 27 17 (!) 25 20  Temp:    98.1 F (36.7 C)  TempSrc:    Oral  SpO2: 91% 92% 94% 90%   Weight:      Height:  Intake/Output Summary (Last 24 hours) at 09/02/2019 1142 Last data filed at 09/02/2019 0726 Gross per 24 hour  Intake 940 ml  Output 1350 ml  Net -410 ml   Filed Weights   09/01/19 1414 09/01/19 1428  Weight: 86.2 kg 88.5 kg     Data Reviewed:   CBC: Recent Labs  Lab 09/01/19 1523 09/02/19 0225  WBC 10.7* 9.7  NEUTROABS 9.7* 9.0*  HGB 9.5* 9.4*  HCT 31.4* 31.1*  MCV 82.0 81.6  PLT 224 Q000111Q   Basic Metabolic Panel: Recent Labs  Lab 09/01/19 1523 09/02/19 0225  NA 131* 137  K 3.5 3.8  CL 97* 102  CO2 24 23  GLUCOSE 101* 172*  BUN 13 16  CREATININE 0.89 1.12  CALCIUM 8.7* 8.7*   GFR: Estimated Creatinine Clearance: 53.9 mL/min (by C-G formula based on SCr of 1.12 mg/dL). Liver Function Tests: Recent Labs  Lab 09/01/19 1523 09/02/19 0225  AST 43* 40  ALT 22 21  ALKPHOS 128* 113  BILITOT 0.8 0.7  PROT 7.5 7.3  ALBUMIN 3.9 3.5   No results for input(s): LIPASE, AMYLASE in the last 168 hours. No results for input(s): AMMONIA in the last 168 hours. Coagulation Profile: No results for input(s): INR, PROTIME in the last 168 hours. Cardiac Enzymes: No results for input(s): CKTOTAL, CKMB, CKMBINDEX, TROPONINI in the last 168 hours. BNP (last 3 results) No results for input(s): PROBNP in the last 8760 hours. HbA1C: No results for input(s): HGBA1C in the last 72 hours. CBG: No results for input(s): GLUCAP in the last 168 hours. Lipid Profile: Recent Labs    09/01/19 1523  TRIG 51   Thyroid Function Tests: No results for input(s): TSH, T4TOTAL, FREET4, T3FREE, THYROIDAB in the last 72 hours. Anemia Panel: Recent Labs    09/01/19 1523 09/02/19 0225  FERRITIN 59 64   Sepsis Labs: Recent Labs  Lab 09/01/19 1522 09/01/19 1523 09/01/19 1709  PROCALCITON  --  0.28  --   LATICACIDVEN 1.4  --  1.2    Recent Results (from the past 240 hour(s))  Respiratory Panel by RT PCR (Flu A&B, Covid) - Nasopharyngeal Swab      Status: Abnormal   Collection Time: 09/01/19  3:21 PM   Specimen: Nasopharyngeal Swab  Result Value Ref Range Status   SARS Coronavirus 2 by RT PCR POSITIVE (A) NEGATIVE Final    Comment: RESULT CALLED TO, READ BACK BY AND VERIFIED WITH: PATRAW,B@1810  BY MATTHEWS, B 2.2.2021 (NOTE) SARS-CoV-2 target nucleic acids are DETECTED. SARS-CoV-2 RNA is generally detectable in upper respiratory specimens  during the acute phase of infection. Positive results are indicative of the presence of the identified virus, but do not rule out bacterial infection or co-infection with other pathogens not detected by the test. Clinical correlation with patient history and other diagnostic information is necessary to determine patient infection status. The expected result is Negative. Fact Sheet for Patients:  PinkCheek.be Fact Sheet for Healthcare Providers: GravelBags.it This test is not yet approved or cleared by the [place] FDA and  has been authorized for detection and/or diagnosis of SARS-CoV-2 by FDA under an Emergency Use Authorization (EUA).  This EUA will remain in effect (meaning this test can be used)  for the duration of  the COVID-19 declaration under Section 564(b)(1) of the Act, 21 U.S.C. section 360bbb-3(b)(1), unless the authorization is terminated or revoked sooner.    Influenza A by PCR NEGATIVE NEGATIVE Final   Influenza B by PCR NEGATIVE NEGATIVE Final  Comment: (NOTE) The Xpert Xpress SARS-CoV-2/FLU/RSV assay is intended as an aid in  the diagnosis of influenza from Nasopharyngeal swab specimens and  should not be used as a sole basis for treatment. Nasal washings and  aspirates are unacceptable for Xpert Xpress SARS-CoV-2/FLU/RSV  testing. Fact Sheet for Patients: PinkCheek.be Fact Sheet for Healthcare Providers: GravelBags.it This test is not yet approved  or cleared by the [place] FDA and  has been authorized for detection and/or diagnosis of SARS-CoV-2 by  FDA under an Emergency Use Authorization (EUA). This EUA will remain  in effect (meaning this test can be used) for the duration of the  Covid-19 declaration under Section 564(b)(1) of the Act, 21  U.S.C. section 360bbb-3(b)(1), unless the authorization is  terminated or revoked. Performed at [facility], [address] [place], [place] [address]   Blood Culture (routine x 2)     Status: None (Preliminary result)   Collection Time: 09/01/19  3:22 PM   Specimen: BLOOD RIGHT FOREARM  Result Value Ref Range Status   Specimen Description BLOOD RIGHT FOREARM  Final   Special Requests   Final    BOTTLES DRAWN AEROBIC AND ANAEROBIC Blood Culture adequate volume   Culture   Final    NO GROWTH < 24 HOURS Performed at [facility], [address] [place], [place] [address]    Report Status PENDING  Incomplete  Blood Culture (routine x 2)     Status: None (Preliminary result)   Collection Time: 09/01/19  5:09 PM   Specimen: Left Antecubital; Blood  Result Value Ref Range Status   Specimen Description LEFT ANTECUBITAL  Final   Special Requests   Final    BOTTLES DRAWN AEROBIC AND ANAEROBIC Blood Culture adequate volume   Culture   Final    NO GROWTH < 24 HOURS Performed at [facility], [address], [place], [place] [address]    Report Status PENDING  Incomplete  MRSA PCR Screening     Status: None   Collection Time: 09/02/19  1:41 AM   Specimen: Nasal Mucosa; Nasopharyngeal  Result Value Ref Range Status   MRSA by PCR NEGATIVE NEGATIVE Final    Comment:        The GeneXpert MRSA Assay (FDA approved for NASAL specimens only), is one component of a comprehensive MRSA colonization surveillance program. It is not intended to diagnose MRSA infection nor to guide or monitor treatment for MRSA infections. Performed at [facility], [place] [address]  [place], [place] [address]          Radiology Studies: [facility] Chest Port 1 View  Result Date: 09/01/2019 CLINICAL DATA:  Dyspnea. EXAM: PORTABLE CHEST 1 VIEW COMPARISON:  None. FINDINGS: Moderate severity patchy infiltrate is seen within the mid left lung with mild infiltrates seen within the mid to lower right lung and left lung base. There is no evidence of a pleural effusion or pneumothorax. The cardiac silhouette is borderline in size. There is moderate severity calcification of the aortic arch. The visualized skeletal structures are unremarkable. IMPRESSION: 1. Moderate severity bilateral infiltrates, left greater than right. Electronically Signed   By: Virgina Norfolk M.D.   On: 09/01/2019 15:56        Scheduled Meds: . allopurinol  300 mg Oral Daily  . amLODipine  10 mg Oral Daily  . vitamin C  500 mg Oral Daily  . dexamethasone (DECADRON) injection  6 mg Intravenous Q24H  . dextromethorphan-guaiFENesin  1 tablet Oral BID  .  feeding supplement (ENSURE ENLIVE)  237 mL Oral BID BM  . Ipratropium-Albuterol  1 puff Inhalation Q6H  . levothyroxine  50 mcg Oral Q0600  . nystatin  5 mL Mouth/Throat QID  . pantoprazole  40 mg Oral Daily  . rivaroxaban  20 mg Oral Q supper  . zinc sulfate  220 mg Oral Daily   Continuous Infusions: . ceFEPime (MAXIPIME) IV    . remdesivir 100 mg in NS 100 mL 100 mg (09/02/19 0959)     LOS: 1 day   Time spent= 35 mins    Kunta Hilleary Arsenio Loader, MD [facility]  If 7PM-7AM, please contact night-coverage  09/02/2019, 11:42 AM

## 2019-09-03 ENCOUNTER — Encounter (HOSPITAL_COMMUNITY): Payer: Self-pay | Admitting: Internal Medicine

## 2019-09-03 LAB — COMPREHENSIVE METABOLIC PANEL
ALT: 26 U/L (ref 0–44)
AST: 46 U/L — ABNORMAL HIGH (ref 15–41)
Albumin: 3.5 g/dL (ref 3.5–5.0)
Alkaline Phosphatase: 125 U/L (ref 38–126)
Anion gap: 11 (ref 5–15)
BUN: 30 mg/dL — ABNORMAL HIGH (ref 8–23)
CO2: 23 mmol/L (ref 22–32)
Calcium: 9 mg/dL (ref 8.9–10.3)
Chloride: 104 mmol/L (ref 98–111)
Creatinine, Ser: 0.94 mg/dL (ref 0.61–1.24)
GFR calc Af Amer: >60 mL/min (ref >60)
GFR calc non Af Amer: >60 mL/min (ref >60)
Glucose, Bld: 122 mg/dL — ABNORMAL HIGH (ref 70–99)
Potassium: 4 mmol/L (ref 3.5–5.1)
Sodium: 138 mmol/L (ref 135–145)
Total Bilirubin: 0.3 mg/dL (ref 0.3–1.2)
Total Protein: 7.3 g/dL (ref 6.5–8.1)

## 2019-09-03 LAB — CBC WITH DIFFERENTIAL/PLATELET
Abs Immature Granulocytes: 0.18 10*3/uL — ABNORMAL HIGH (ref 0.00–0.07)
Basophils Absolute: 0 10*3/uL (ref 0.0–0.1)
Basophils Relative: 0 %
Eosinophils Absolute: 0 10*3/uL (ref 0.0–0.5)
Eosinophils Relative: 0 %
HCT: 32.3 % — ABNORMAL LOW (ref 39.0–52.0)
Hemoglobin: 9.8 g/dL — ABNORMAL LOW (ref 13.0–17.0)
Immature Granulocytes: 1 %
Lymphocytes Relative: 4 %
Lymphs Abs: 0.6 10*3/uL — ABNORMAL LOW (ref 0.7–4.0)
MCH: 24.6 pg — ABNORMAL LOW (ref 26.0–34.0)
MCHC: 30.3 g/dL (ref 30.0–36.0)
MCV: 81 fL (ref 80.0–100.0)
Monocytes Absolute: 0.3 10*3/uL (ref 0.1–1.0)
Monocytes Relative: 2 %
Neutro Abs: 14.4 10*3/uL — ABNORMAL HIGH (ref 1.7–7.7)
Neutrophils Relative %: 93 %
Platelets: 272 10*3/uL (ref 150–400)
RBC: 3.99 MIL/uL — ABNORMAL LOW (ref 4.22–5.81)
RDW: 16.4 % — ABNORMAL HIGH (ref 11.5–15.5)
WBC: 15.5 10*3/uL — ABNORMAL HIGH (ref 4.0–10.5)
nRBC: 0 % (ref 0.0–0.2)

## 2019-09-03 LAB — FERRITIN: Ferritin: 103 ng/mL (ref 24–336)

## 2019-09-03 LAB — BRAIN NATRIURETIC PEPTIDE: B Natriuretic Peptide: 135.4 pg/mL — ABNORMAL HIGH (ref 0.0–100.0)

## 2019-09-03 LAB — TYPE AND SCREEN
ABO/RH(D): A NEG
Antibody Screen: NEGATIVE

## 2019-09-03 LAB — C-REACTIVE PROTEIN: CRP: 19.5 mg/dL — ABNORMAL HIGH (ref <1.0)

## 2019-09-03 LAB — MAGNESIUM: Magnesium: 2.2 mg/dL (ref 1.7–2.4)

## 2019-09-03 LAB — LACTATE DEHYDROGENASE: LDH: 288 U/L — ABNORMAL HIGH (ref 98–192)

## 2019-09-03 LAB — D-DIMER, QUANTITATIVE: D-Dimer, Quant: 1.63 ug/mL-FEU — ABNORMAL HIGH (ref 0.00–0.50)

## 2019-09-03 MED ORDER — SALINE SPRAY 0.65 % NA SOLN
1.0000 | NASAL | Status: DC | PRN
  Filled 2019-09-03: qty 44

## 2019-09-03 MED ORDER — SODIUM CHLORIDE 0.9% IV SOLUTION: Freq: Once | INTRAVENOUS | Status: AC

## 2019-09-03 MED ORDER — FUROSEMIDE 10 MG/ML IJ SOLN
40.0000 mg | Freq: Three times a day (TID) | INTRAMUSCULAR | Status: AC
  Administered 2019-09-03 (×2): 40 mg via INTRAVENOUS
  Filled 2019-09-03 (×2): qty 4

## 2019-09-03 MED ORDER — SODIUM CHLORIDE 0.9 % IV SOLN
2.0000 g | Freq: Three times a day (TID) | INTRAVENOUS | Status: DC
  Administered 2019-09-03 – 2019-09-07 (×11): 2 g via INTRAVENOUS
  Filled 2019-09-03 (×15): qty 2

## 2019-09-03 NOTE — Progress Notes (Addendum)
PROGRESS NOTE    Steven Gutierrez  [id number] DOB: Nov 29, 1932 DOA: 09/01/2019 PCP: [name], MD   Brief Narrative:  86 year old with history of atrial fibrillation, HTN, skin cancer came to the hospital with complaints of shortness of breath which had been ongoing for a week.  Tested negative for COVID-19 in the outpatient and was treated with Augmentin but due to progression of his symptoms he came to the hospital.  He was noted to be hypoxic in the ER requiring nonrebreather.  Chest x-ray showed bilateral infiltrates.  He was started on IV Rocephin and azithromycin and admitted to the hospital.  Upon admission he was placed on vancomycin and cefepime, Decadron and remdesivir. Received Actemra and Ordered Plasma    Assessment & Plan:   Principal Problem:   Pneumonia due to COVID-19 virus Active Problems:   Unspecified atrial fibrillation (HCC)   Essential hypertension  Acute hypoxic respiratory failure secondary to COVID-19 pneumonia -Oxygen levels-4 L nasal cannula. Still remains hypoxic, no on O2 at home.  -Remdesivir-day 3 -Decadrone-day 3 -Routine: Labs have been reviewed including ferritin, LDH, CRP, d-dimer, fibrinogen.  Will need to trend this lab daily. -Vitamin C & Zinc. Prone >16hrs/day.  -procalcitonin-0.28 -MRSA screen negative, discontinue vancomycin -Chest x-ray-bilateral infiltrates -Supportive care-antitussive, inhalers, I-S/flutter -CODE STATUS confirmed -Received Actemra 2/2. Order convalescent plasma  Atrial fibrillation -Rate controlled.  On Xarelto  Essential hypertension -Norvasc daily  History of gout -Allopurinol  Hypothyroidism -Synthroid   DVT prophylaxis: Xarelto Code Status: Full code Family Communication: Spoke with his Son.  Disposition Plan:   Patient From= home  Patient Anticipated D/C place= Home  Barriers= maintain hospital stay until he is on oxygen.  Currently on 4 L nasal cannula.  Not on any home oxygen.  Once weaned off  will be discharged   Subjective: Did not rest much last night, symptomatically feels slightly better but still gets hypoxia with minimal exertion.   Review of Systems Otherwise negative except as per HPI, including: General = no fevers, chills, dizziness,  fatigue HEENT/EYES = negative for loss of vision, double vision, blurred vision,  sore throa Cardiovascular= negative for chest pain, palpitation Respiratory/lungs= negative for shortness of breath, cough, wheezing; hemoptysis,  Gastrointestinal= negative for nausea, vomiting, abdominal pain Genitourinary= negative for Dysuria MSK = Negative for arthralgia, myalgias Neurology= Negative for headache, numbness, tingling  Psychiatry= Negative for suicidal and homocidal ideation Skin= Negative for Rash   Examination: Constitutional: Not in acute distress; on 4L [place] Respiratory: Bibasilar Rhonchi.  Cardiovascular: Normal sinus rhythm, no rubs Abdomen: Nontender nondistended good bowel sounds Musculoskeletal: No edema noted Skin: No rashes seen Neurologic: CN 2-12 grossly intact.  And nonfocal Psychiatric: Normal judgment and insight. Alert and oriented x 3. Normal mood.   Objective: Vitals:   09/02/19 1949 09/03/19 0000 09/03/19 0315 09/03/19 0739  BP: (!) 141/67 (!) 143/63 133/70 119/69  Pulse: 82   84  Resp: 18 18 15 17   Temp: 98.5 F (36.9 C) 97.7 F (36.5 C) 97.8 F (36.6 C) 97.6 F (36.4 C)  TempSrc: Oral Axillary Oral Oral  SpO2: 90% 92% 91% 91%  Weight:      Height:        Intake/Output Summary (Last 24 hours) at 09/03/2019 1024 Last data filed at 09/03/2019 0946 Gross per 24 hour  Intake 1550 ml  Output 1115 ml  Net 435 ml   Filed Weights   09/01/19 1414 09/01/19 1428  Weight: 86.2 kg 88.5 kg     Data Reviewed:  CBC: Recent Labs  Lab 09/01/19 1523 09/02/19 0225 09/03/19 0245  WBC 10.7* 9.7 15.5*  NEUTROABS 9.7* 9.0* 14.4*  HGB 9.5* 9.4* 9.8*  HCT 31.4* 31.1* 32.3*  MCV 82.0 81.6 81.0  PLT 224  234 Q000111Q   Basic Metabolic Panel: Recent Labs  Lab 09/01/19 1523 09/02/19 0225 09/03/19 0245  NA 131* 137 138  K 3.5 3.8 4.0  CL 97* 102 104  CO2 24 23 23   GLUCOSE 101* 172* 122*  BUN 13 16 30*  CREATININE 0.89 1.12 0.94  CALCIUM 8.7* 8.7* 9.0  MG  --   --  2.2   GFR: Estimated Creatinine Clearance: 64.2 mL/min (by C-G formula based on SCr of 0.94 mg/dL). Liver Function Tests: Recent Labs  Lab 09/01/19 1523 09/02/19 0225 09/03/19 0245  AST 43* 40 46*  ALT 22 21 26   ALKPHOS 128* 113 125  BILITOT 0.8 0.7 0.3  PROT 7.5 7.3 7.3  ALBUMIN 3.9 3.5 3.5   No results for input(s): LIPASE, AMYLASE in the last 168 hours. No results for input(s): AMMONIA in the last 168 hours. Coagulation Profile: No results for input(s): INR, PROTIME in the last 168 hours. Cardiac Enzymes: No results for input(s): CKTOTAL, CKMB, CKMBINDEX, TROPONINI in the last 168 hours. BNP (last 3 results) No results for input(s): PROBNP in the last 8760 hours. HbA1C: No results for input(s): HGBA1C in the last 72 hours. CBG: No results for input(s): GLUCAP in the last 168 hours. Lipid Profile: Recent Labs    09/01/19 1523  TRIG 51   Thyroid Function Tests: No results for input(s): TSH, T4TOTAL, FREET4, T3FREE, THYROIDAB in the last 72 hours. Anemia Panel: Recent Labs    09/02/19 0225 09/03/19 0245  FERRITIN 64 103   Sepsis Labs: Recent Labs  Lab 09/01/19 1522 09/01/19 1523 09/01/19 1709  PROCALCITON  --  0.28  --   LATICACIDVEN 1.4  --  1.2    Recent Results (from the past 240 hour(s))  Respiratory Panel by RT PCR (Flu A&B, Covid) - Nasopharyngeal Swab     Status: Abnormal   Collection Time: 09/01/19  3:21 PM   Specimen: Nasopharyngeal Swab  Result Value Ref Range Status   SARS Coronavirus 2 by RT PCR POSITIVE (A) NEGATIVE Final    Comment: RESULT CALLED TO, READ BACK BY AND VERIFIED WITH: PATRAW,B@1810  BY MATTHEWS, B 2.2.2021 (NOTE) SARS-CoV-2 target nucleic acids are  DETECTED. SARS-CoV-2 RNA is generally detectable in upper respiratory specimens  during the acute phase of infection. Positive results are indicative of the presence of the identified virus, but do not rule out bacterial infection or co-infection with other pathogens not detected by the test. Clinical correlation with patient history and other diagnostic information is necessary to determine patient infection status. The expected result is Negative. Fact Sheet for Patients:  PinkCheek.be Fact Sheet for Healthcare Providers: GravelBags.it This test is not yet approved or cleared by the [place] FDA and  has been authorized for detection and/or diagnosis of SARS-CoV-2 by FDA under an Emergency Use Authorization (EUA).  This EUA will remain in effect (meaning this test can be used)  for the duration of  the COVID-19 declaration under Section 564(b)(1) of the Act, 21 U.S.C. section 360bbb-3(b)(1), unless the authorization is terminated or revoked sooner.    Influenza A by PCR NEGATIVE NEGATIVE Final   Influenza B by PCR NEGATIVE NEGATIVE Final    Comment: (NOTE) The Xpert Xpress SARS-CoV-2/FLU/RSV assay is intended as an aid in  the  diagnosis of influenza from Nasopharyngeal swab specimens and  should not be used as a sole basis for treatment. Nasal washings and  aspirates are unacceptable for Xpert Xpress SARS-CoV-2/FLU/RSV  testing. Fact Sheet for Patients: PinkCheek.be Fact Sheet for Healthcare Providers: GravelBags.it This test is not yet approved or cleared by the [place] FDA and  has been authorized for detection and/or diagnosis of SARS-CoV-2 by  FDA under an Emergency Use Authorization (EUA). This EUA will remain  in effect (meaning this test can be used) for the duration of the  Covid-19 declaration under Section 564(b)(1) of the Act, 21  U.S.C.  section 360bbb-3(b)(1), unless the authorization is  terminated or revoked. Performed at [facility], [address] [place], [place] [address]   Blood Culture (routine x 2)     Status: None (Preliminary result)   Collection Time: 09/01/19  3:22 PM   Specimen: BLOOD RIGHT FOREARM  Result Value Ref Range Status   Specimen Description BLOOD RIGHT FOREARM  Final   Special Requests   Final    BOTTLES DRAWN AEROBIC AND ANAEROBIC Blood Culture adequate volume   Culture   Final    NO GROWTH 2 DAYS Performed at [facility], [address], [place], [place] [address]    Report Status PENDING  Incomplete  Blood Culture (routine x 2)     Status: None (Preliminary result)   Collection Time: 09/01/19  5:09 PM   Specimen: Left Antecubital; Blood  Result Value Ref Range Status   Specimen Description LEFT ANTECUBITAL  Final   Special Requests   Final    BOTTLES DRAWN AEROBIC AND ANAEROBIC Blood Culture adequate volume   Culture   Final    NO GROWTH 2 DAYS Performed at [facility], [address], [place], [place] [address]    Report Status PENDING  Incomplete  MRSA PCR Screening     Status: None   Collection Time: 09/02/19  1:41 AM   Specimen: Nasal Mucosa; Nasopharyngeal  Result Value Ref Range Status   MRSA by PCR NEGATIVE NEGATIVE Final    Comment:        The GeneXpert MRSA Assay (FDA approved for NASAL specimens only), is one component of a comprehensive MRSA colonization surveillance program. It is not intended to diagnose MRSA infection nor to guide or monitor treatment for MRSA infections. Performed at [facility], [place] [address] [place], [place] [address]          Radiology Studies: [facility] Chest Port 1 View  Result Date: 09/01/2019 CLINICAL DATA:  Dyspnea. EXAM: PORTABLE CHEST 1 VIEW COMPARISON:  None. FINDINGS: Moderate severity patchy infiltrate is seen within the mid left lung with mild infiltrates seen within the mid to lower right lung and left lung  base. There is no evidence of a pleural effusion or pneumothorax. The cardiac silhouette is borderline in size. There is moderate severity calcification of the aortic arch. The visualized skeletal structures are unremarkable. IMPRESSION: 1. Moderate severity bilateral infiltrates, left greater than right. Electronically Signed   By: Virgina Norfolk M.D.   On: 09/01/2019 15:56        Scheduled Meds: . sodium chloride   Intravenous Once  . allopurinol  300 mg Oral Daily  . amLODipine  10 mg Oral Daily  . vitamin C  500 mg Oral Daily  . dexamethasone (DECADRON) injection  6 mg Intravenous Q24H  . dextromethorphan-guaiFENesin  1 tablet Oral BID  . feeding supplement (ENSURE ENLIVE)  237 mL Oral  TID BM  . furosemide  40 mg Intravenous Q8H  . Ipratropium-Albuterol  1 puff Inhalation Q6H  . levothyroxine  50 mcg Oral Q0600  . nystatin  5 mL Mouth/Throat QID  . pantoprazole  40 mg Oral Daily  . rivaroxaban  20 mg Oral Q supper  . timolol  1 drop Left Eye Daily  . zinc sulfate  220 mg Oral Daily   Continuous Infusions: . ceFEPime (MAXIPIME) IV    . remdesivir 100 mg in NS 100 mL 100 mg (09/03/19 0946)     LOS: 2 days   Time spent= 35 mins    Dewanda Fennema Arsenio Loader, MD [facility]  If 7PM-7AM, please contact night-coverage  09/03/2019, 10:24 AM

## 2019-09-03 NOTE — Progress Notes (Signed)
Patient deferred family update. 

## 2019-09-03 NOTE — Progress Notes (Signed)
Pharmacy Antibiotic Note  Steven Gutierrez is a 86 y.o. male admitted on 09/01/2019 with COVID-19 pneumonia.  Pharmacy has been consulted for cefepime dosing due to concern for superimposed PNA.  Today, 09/03/19 -SCr 0.9, CrCL ~64 mL/min -WBC 15.5 is elevated. Of note, pt on steroids  Plan:  Increase cefepime to 2 g IV q8h based on improvement in renal function  Monitor cultures and renal function  Height: 6' (182.9 cm) Weight: 195 lb (88.5 kg) IBW/kg (Calculated) : 77.6  Temp (24hrs), Avg:98 F (36.7 C), Min:97.6 F (36.4 C), Max:98.5 F (36.9 C)  Recent Labs  Lab 09/01/19 1522 09/01/19 1523 09/01/19 1709 09/02/19 0225 09/03/19 0245  WBC  --  10.7*  --  9.7 15.5*  CREATININE  --  0.89  --  1.12 0.94  LATICACIDVEN 1.4  --  1.2  --   --     Estimated Creatinine Clearance: 64.2 mL/min (by C-G formula based on SCr of 0.94 mg/dL).    Allergies  Allergen Reactions  . Oxycodone Nausea And Vomiting    Antimicrobials this admission: Vancomycin 2/2 >>  2/3 Cefepime 2/2>>   Ceftriaxone 2/2>>2/2 Azithromycin 2/2>>2/2 remdesivir 2/2>>2/6 Actemra 2/2 x1   Microbiology results: 2/2 BC x2: ngtd 2/2 Resp PCR: SARS CoV-2 positive; Flu A/B negative 2/2 MRSA PCR:  negative  Lenis Noon, PharmD 09/03/19 10:25 AM

## 2019-09-03 NOTE — Progress Notes (Signed)
Updated pt contact (son) via phone. Pt family had no further questions.

## 2019-09-03 NOTE — Progress Notes (Signed)
   09/03/19 0800  Family/Significant Other Communication  Family/Significant Other Update Called;Updated  Called and updated son per pt request. All questions asked and all questions answered.

## 2019-09-04 ENCOUNTER — Inpatient Hospital Stay (HOSPITAL_COMMUNITY): Payer: Medicare Other

## 2019-09-04 LAB — PREPARE FRESH FROZEN PLASMA

## 2019-09-04 LAB — BPAM FFP
Blood Product Expiration Date: 202102051138
ISSUE DATE / TIME: 202102041142
Unit Type and Rh: 6200

## 2019-09-04 LAB — COMPREHENSIVE METABOLIC PANEL
ALT: 32 U/L (ref 0–44)
AST: 52 U/L — ABNORMAL HIGH (ref 15–41)
Albumin: 3.6 g/dL (ref 3.5–5.0)
Alkaline Phosphatase: 121 U/L (ref 38–126)
Anion gap: 11 (ref 5–15)
BUN: 36 mg/dL — ABNORMAL HIGH (ref 8–23)
CO2: 25 mmol/L (ref 22–32)
Calcium: 9 mg/dL (ref 8.9–10.3)
Chloride: 102 mmol/L (ref 98–111)
Creatinine, Ser: 1.16 mg/dL (ref 0.61–1.24)
GFR calc Af Amer: >60 mL/min (ref >60)
GFR calc non Af Amer: 58 mL/min — ABNORMAL LOW (ref >60)
Glucose, Bld: 120 mg/dL — ABNORMAL HIGH (ref 70–99)
Potassium: 3.4 mmol/L — ABNORMAL LOW (ref 3.5–5.1)
Sodium: 138 mmol/L (ref 135–145)
Total Bilirubin: 0.4 mg/dL (ref 0.3–1.2)
Total Protein: 7.6 g/dL (ref 6.5–8.1)

## 2019-09-04 LAB — MAGNESIUM: Magnesium: 2.1 mg/dL (ref 1.7–2.4)

## 2019-09-04 LAB — CBC WITH DIFFERENTIAL/PLATELET
Abs Immature Granulocytes: 0.3 10*3/uL — ABNORMAL HIGH (ref 0.00–0.07)
Basophils Absolute: 0 10*3/uL (ref 0.0–0.1)
Basophils Relative: 0 %
Eosinophils Absolute: 0 10*3/uL (ref 0.0–0.5)
Eosinophils Relative: 0 %
HCT: 31 % — ABNORMAL LOW (ref 39.0–52.0)
Hemoglobin: 9.5 g/dL — ABNORMAL LOW (ref 13.0–17.0)
Immature Granulocytes: 2 %
Lymphocytes Relative: 4 %
Lymphs Abs: 0.6 10*3/uL — ABNORMAL LOW (ref 0.7–4.0)
MCH: 24.4 pg — ABNORMAL LOW (ref 26.0–34.0)
MCHC: 30.6 g/dL (ref 30.0–36.0)
MCV: 79.7 fL — ABNORMAL LOW (ref 80.0–100.0)
Monocytes Absolute: 0.5 10*3/uL (ref 0.1–1.0)
Monocytes Relative: 3 %
Neutro Abs: 13.3 10*3/uL — ABNORMAL HIGH (ref 1.7–7.7)
Neutrophils Relative %: 91 %
Platelets: 291 10*3/uL (ref 150–400)
RBC: 3.89 MIL/uL — ABNORMAL LOW (ref 4.22–5.81)
RDW: 16.6 % — ABNORMAL HIGH (ref 11.5–15.5)
WBC: 14.7 10*3/uL — ABNORMAL HIGH (ref 4.0–10.5)
nRBC: 0.1 % (ref 0.0–0.2)

## 2019-09-04 LAB — C-REACTIVE PROTEIN: CRP: 11.6 mg/dL — ABNORMAL HIGH (ref <1.0)

## 2019-09-04 LAB — FERRITIN: Ferritin: 94 ng/mL (ref 24–336)

## 2019-09-04 LAB — D-DIMER, QUANTITATIVE: D-Dimer, Quant: 1.43 ug/mL-FEU — ABNORMAL HIGH (ref 0.00–0.50)

## 2019-09-04 LAB — LACTATE DEHYDROGENASE: LDH: 299 U/L — ABNORMAL HIGH (ref 98–192)

## 2019-09-04 MED ORDER — FERROUS SULFATE 325 (65 FE) MG PO TABS
325.0000 mg | ORAL_TABLET | Freq: Two times a day (BID) | ORAL | Status: DC
  Administered 2019-09-04 – 2019-09-07 (×7): 325 mg via ORAL
  Filled 2019-09-04 (×7): qty 1

## 2019-09-04 MED ORDER — POTASSIUM CHLORIDE CRYS ER 20 MEQ PO TBCR
40.0000 meq | EXTENDED_RELEASE_TABLET | Freq: Once | ORAL | Status: AC
  Administered 2019-09-04: 09:00:00 40 meq via ORAL
  Filled 2019-09-04: qty 2

## 2019-09-04 NOTE — Progress Notes (Signed)
0515 Pt arrived to room. HFNC 15 L 02 SAT 91%.

## 2019-09-04 NOTE — Progress Notes (Signed)
PROGRESS NOTE    Steven Gutierrez  [id number] DOB: 12-14-1932 DOA: 09/01/2019 PCP: [name], MD   Brief Narrative:  86 year old with history of atrial fibrillation, HTN, skin cancer came to the hospital with complaints of shortness of breath which had been ongoing for a week.  Tested negative for COVID-19 in the outpatient and was treated with Augmentin but due to progression of his symptoms he came to the hospital.  He was noted to be hypoxic in the ER requiring nonrebreather.  Chest x-ray showed bilateral infiltrates.  He was started on IV Rocephin and azithromycin and admitted to the hospital.  Upon admission he was placed on vancomycin and cefepime, Decadron and remdesivir.  Also received Actemra and convalescent plasma.   Assessment & Plan:   Principal Problem:   Pneumonia due to COVID-19 virus Active Problems:   Unspecified atrial fibrillation (HCC)   Essential hypertension  Acute hypoxic respiratory failure secondary to COVID-19 pneumonia -Oxygen levels-worsening hypoxia overnight now on 6 L high flow nasal cannula. -Remdesivir-day 3 -Decadrone-day 3 -Routine: Labs have been reviewed including ferritin, LDH, CRP, d-dimer, fibrinogen.  Will need to trend this lab daily. -Vitamin C & Zinc. Prone >16hrs/day.  -procalcitonin-0.28 -MRSA screen negative, discontinue vancomycin -Chest x-ray-diffuse bilateral infiltrates -Supportive care-antitussive, inhalers, I-S/flutter -CODE STATUS confirmed -Status post Actemra and convalescent plasma  Atrial fibrillation -Rate controlled, on Xarelto  Essential hypertension -Norvasc daily  History of gout -Allopurinol  Hypothyroidism -Synthroid   DVT prophylaxis: Xarelto Code Status: Full code Family Communication: Son called, left a voicemail.  Disposition Plan:   Patient From= home  Patient Anticipated D/C place= Home  Barriers= patient is now on high flow oxygen.  Not any home oxygen, unsafe for discharge.  Ongoing  respiratory treatment.   Subjective: Feels slightly better this morning but overnight had episode of hypoxia requiring high flow oxygen and transferred to progressive care unit.  Review of Systems Otherwise negative except as per HPI, including: General = no fevers, chills, dizziness,  fatigue HEENT/EYES = negative for loss of vision, double vision, blurred vision,  sore throa Cardiovascular= negative for chest pain, palpitation Respiratory/lungs= negative wheezing; hemoptysis,  Gastrointestinal= negative for nausea, vomiting, abdominal pain Genitourinary= negative for Dysuria MSK = Negative for arthralgia, myalgias Neurology= Negative for headache, numbness, tingling  Psychiatry= Negative for suicidal and homocidal ideation Skin= Negative for Rash  Examination: Constitutional: Not in acute distress, 6 L high flow nasal cannula Respiratory: Bilateral rhonchi Cardiovascular: Normal sinus rhythm, no rubs Abdomen: Nontender nondistended good bowel sounds Musculoskeletal: No edema noted Skin: No rashes seen Neurologic: CN 2-12 grossly intact.  And nonfocal Psychiatric: Normal judgment and insight. Alert and oriented x 3. Normal mood.     Objective: Vitals:   09/03/19 2100 09/04/19 0400 09/04/19 0445 09/04/19 0905  BP: (!) 116/56 130/67    Pulse: 82     Resp: 20 18    Temp: (!) 97.5 F (36.4 C) 97.7 F (36.5 C)  98.1 F (36.7 C)  TempSrc: Oral Oral  Oral  SpO2: 90% (!) 80% (!) 88% 96%  Weight:      Height:        Intake/Output Summary (Last 24 hours) at 09/04/2019 1100 Last data filed at 09/04/2019 0400 Gross per 24 hour  Intake 233.75 ml  Output 1650 ml  Net -1416.25 ml   Filed Weights   09/01/19 1414 09/01/19 1428  Weight: 86.2 kg 88.5 kg     Data Reviewed:   CBC: Recent Labs  Lab 09/01/19 1523  09/02/19 0225 09/03/19 0245 09/04/19 0412  WBC 10.7* 9.7 15.5* 14.7*  NEUTROABS 9.7* 9.0* 14.4* 13.3*  HGB 9.5* 9.4* 9.8* 9.5*  HCT 31.4* 31.1* 32.3* 31.0*    MCV 82.0 81.6 81.0 79.7*  PLT 224 234 272 [id number]   Basic Metabolic Panel: Recent Labs  Lab 09/01/19 1523 09/02/19 0225 09/03/19 0245 09/04/19 0412  NA 131* 137 138 138  K 3.5 3.8 4.0 3.4*  CL 97* 102 104 102  CO2 24 23 23 25   GLUCOSE 101* 172* 122* 120*  BUN 13 16 30* 36*  CREATININE 0.89 1.12 0.94 1.16  CALCIUM 8.7* 8.7* 9.0 9.0  MG  --   --  2.2 2.1   GFR: Estimated Creatinine Clearance: 52 mL/min (by C-G formula based on SCr of 1.16 mg/dL). Liver Function Tests: Recent Labs  Lab 09/01/19 1523 09/02/19 0225 09/03/19 0245 09/04/19 0412  AST 43* 40 46* 52*  ALT 22 21 26  32  ALKPHOS 128* 113 125 121  BILITOT 0.8 0.7 0.3 0.4  PROT 7.5 7.3 7.3 7.6  ALBUMIN 3.9 3.5 3.5 3.6   No results for input(s): LIPASE, AMYLASE in the last 168 hours. No results for input(s): AMMONIA in the last 168 hours. Coagulation Profile: No results for input(s): INR, PROTIME in the last 168 hours. Cardiac Enzymes: No results for input(s): CKTOTAL, CKMB, CKMBINDEX, TROPONINI in the last 168 hours. BNP (last 3 results) No results for input(s): PROBNP in the last 8760 hours. HbA1C: No results for input(s): HGBA1C in the last 72 hours. CBG: No results for input(s): GLUCAP in the last 168 hours. Lipid Profile: Recent Labs    09/01/19 1523  TRIG 51   Thyroid Function Tests: No results for input(s): TSH, T4TOTAL, FREET4, T3FREE, THYROIDAB in the last 72 hours. Anemia Panel: Recent Labs    09/03/19 0245 09/04/19 0412  FERRITIN 103 94   Sepsis Labs: Recent Labs  Lab 09/01/19 1522 09/01/19 1523 09/01/19 1709  PROCALCITON  --  0.28  --   LATICACIDVEN 1.4  --  1.2    Recent Results (from the past 240 hour(s))  Respiratory Panel by RT PCR (Flu A&B, Covid) - Nasopharyngeal Swab     Status: Abnormal   Collection Time: 09/01/19  3:21 PM   Specimen: Nasopharyngeal Swab  Result Value Ref Range Status   SARS Coronavirus 2 by RT PCR POSITIVE (A) NEGATIVE Final    Comment: RESULT CALLED  TO, READ BACK BY AND VERIFIED WITH: PATRAW,B@1810  BY MATTHEWS, B 2.2.2021 (NOTE) SARS-CoV-2 target nucleic acids are DETECTED. SARS-CoV-2 RNA is generally detectable in upper respiratory specimens  during the acute phase of infection. Positive results are indicative of the presence of the identified virus, but do not rule out bacterial infection or co-infection with other pathogens not detected by the test. Clinical correlation with patient history and other diagnostic information is necessary to determine patient infection status. The expected result is Negative. Fact Sheet for Patients:  PinkCheek.be Fact Sheet for Healthcare Providers: GravelBags.it This test is not yet approved or cleared by the [place] FDA and  has been authorized for detection and/or diagnosis of SARS-CoV-2 by FDA under an Emergency Use Authorization (EUA).  This EUA will remain in effect (meaning this test can be used)  for the duration of  the COVID-19 declaration under Section 564(b)(1) of the Act, 21 U.S.C. section 360bbb-3(b)(1), unless the authorization is terminated or revoked sooner.    Influenza A by PCR NEGATIVE NEGATIVE Final   Influenza B by PCR  NEGATIVE NEGATIVE Final    Comment: (NOTE) The Xpert Xpress SARS-CoV-2/FLU/RSV assay is intended as an aid in  the diagnosis of influenza from Nasopharyngeal swab specimens and  should not be used as a sole basis for treatment. Nasal washings and  aspirates are unacceptable for Xpert Xpress SARS-CoV-2/FLU/RSV  testing. Fact Sheet for Patients: PinkCheek.be Fact Sheet for Healthcare Providers: GravelBags.it This test is not yet approved or cleared by the [place] FDA and  has been authorized for detection and/or diagnosis of SARS-CoV-2 by  FDA under an Emergency Use Authorization (EUA). This EUA will remain  in effect (meaning this  test can be used) for the duration of the  Covid-19 declaration under Section 564(b)(1) of the Act, 21  U.S.C. section 360bbb-3(b)(1), unless the authorization is  terminated or revoked. Performed at [facility], [address], [place], [place] [address]   Blood Culture (routine x 2)     Status: None (Preliminary result)   Collection Time: 09/01/19  3:22 PM   Specimen: BLOOD RIGHT FOREARM  Result Value Ref Range Status   Specimen Description BLOOD RIGHT FOREARM  Final   Special Requests   Final    BOTTLES DRAWN AEROBIC AND ANAEROBIC Blood Culture adequate volume   Culture   Final    NO GROWTH 3 DAYS Performed at [facility], [address], [place], [place] [address]    Report Status PENDING  Incomplete  Blood Culture (routine x 2)     Status: None (Preliminary result)   Collection Time: 09/01/19  5:09 PM   Specimen: Left Antecubital; Blood  Result Value Ref Range Status   Specimen Description LEFT ANTECUBITAL  Final   Special Requests   Final    BOTTLES DRAWN AEROBIC AND ANAEROBIC Blood Culture adequate volume   Culture   Final    NO GROWTH 3 DAYS Performed at [facility], [address], [place], [place] [address]    Report Status PENDING  Incomplete  MRSA PCR Screening     Status: None   Collection Time: 09/02/19  1:41 AM   Specimen: Nasal Mucosa; Nasopharyngeal  Result Value Ref Range Status   MRSA by PCR NEGATIVE NEGATIVE Final    Comment:        The GeneXpert MRSA Assay (FDA approved for NASAL specimens only), is one component of a comprehensive MRSA colonization surveillance program. It is not intended to diagnose MRSA infection nor to guide or monitor treatment for MRSA infections. Performed at [facility], [place] [address], [place], [place] [address]          Radiology Studies: DG Chest Port 1 View  Result Date: 09/04/2019 CLINICAL DATA:  Dyspnea, history of fever EXAM: PORTABLE CHEST 1 VIEW COMPARISON:  09/01/2019 FINDINGS: Single  frontal view of the chest demonstrates a stable cardiac silhouette. The dense areas of consolidation seen within the perihilar regions previously have been replaced by diffuse interstitial and ground-glass opacities, now most pronounced in the left infrahilar region. No effusion or pneumothorax. No acute bony abnormalities. IMPRESSION: 1. Diffuse interstitial and ground-glass opacities as above. Appearance would favor atypical viral pneumonia. Electronically Signed   By: Randa Ngo M.D.   On: 09/04/2019 08:56        Scheduled Meds: . allopurinol  300 mg Oral Daily  . amLODipine  10 mg Oral Daily  . vitamin C  500 mg Oral Daily  . dexamethasone (DECADRON) injection  6 mg Intravenous Q24H  . dextromethorphan-guaiFENesin  1 tablet Oral BID  .  feeding supplement (ENSURE ENLIVE)  237 mL Oral TID BM  . ferrous sulfate  325 mg Oral BID WC  . Ipratropium-Albuterol  1 puff Inhalation Q6H  . levothyroxine  50 mcg Oral Q0600  . nystatin  5 mL Mouth/Throat QID  . pantoprazole  40 mg Oral Daily  . rivaroxaban  20 mg Oral Q supper  . timolol  1 drop Left Eye Daily  . zinc sulfate  220 mg Oral Daily   Continuous Infusions: . ceFEPime (MAXIPIME) IV 2 g (09/03/19 2109)  . remdesivir 100 mg in NS 100 mL 100 mg (09/04/19 0928)     LOS: 3 days   Time spent= 35 mins    Tandra Rosado Arsenio Loader, MD [facility]  If 7PM-7AM, please contact night-coverage  09/04/2019, 11:00 AM

## 2019-09-04 NOTE — Progress Notes (Signed)
Pt A&Ox4 OOB with SB assist to recliner, denies pain or discomfort, Afib with beats of VT, MD aware, EKG ordered, cont on Xarelto for Afib, denies chest pain or palpitations, O2@5HFNC , SOB on exertion, co-op and pleasant with care will cont to monitor.

## 2019-09-04 NOTE — Progress Notes (Signed)
Pt O2 was 78% on 4L [place] at 0400 no S/S of SOB. Pt was placed on 10L HFNC @0430  after other interventions didn't work O2 came up to 86%. Requested to transfer Pt to PCU and notified MD.

## 2019-09-04 NOTE — Progress Notes (Signed)
Occupational Therapy Evaluation Patient Details Name: Steven Gutierrez MRN: [id number] DOB: 01-02-1933 Today's Date: 09/04/2019    History of Present Illness Pt is an 86 y.o. male admitted 09/01/19 with difficulty breathing, fever and cough; (+) COVID-19, CXR with moderate bilateral infiltrates (L>R). PMH includes afib, HTN, gout.   Clinical Impression   PTA Patient was Independent with all self-care tasks, IADLs, functional mobility, caregiver to pet dog, and driving. Patient resides in a multi-level home alone, but able to reside on main level with bedroom and bathroom. Patient was cooperative during OT eval and aware of possible need for part-time caregiver assistance at home upon discharge with IADL tasks. Patient's overall level of assist with self-care tasks was set-up to Minimal A with use of RW to mobilize around room. Patient's O2 stats dropped to 82% on 6L Nasal cannula when mobilizing to bathroom and required 5 minutes to increase to 90% on 5L of HFNC while sitting in recliner. Patient will benefit from continued use of flutter valve and IS, HHOT, and portable O2 due to productive cough and dyspnea upon exertion with ADL tasks.     Follow Up Recommendations  Home health OT    Equipment Recommendations  3 in 1 bedside commode    Recommendations for Other Services       Precautions / Restrictions Precautions Precautions: Fall Precaution Comments: Airborne Restrictions Weight Bearing Restrictions: No      Mobility Bed Mobility               General bed mobility comments: (patient was sitting in recliner upon arrival for OT eval )  Transfers Overall transfer level: Needs assistance Equipment used: Rolling walker (2 wheeled) Transfers: Stand Pivot Transfers;Sit to/from Stand Sit to Stand: Min assist Stand pivot transfers: Min assist            Balance Overall balance assessment: Needs assistance   Sitting balance-Leahy Scale: Good       Standing  balance-Leahy Scale: Fair                             ADL either performed or assessed with clinical judgement   ADL Overall ADL's : Needs assistance/impaired Eating/Feeding: Set up   Grooming: Set up   Upper Body Bathing: Set up   Lower Body Bathing: Minimal assistance   Upper Body Dressing : Set up   Lower Body Dressing: Minimal assistance   Toilet Transfer: Minimal assistance   Toileting- Clothing Manipulation and Hygiene: Minimal assistance       Functional mobility during ADLs: Minimal assistance       Vision Baseline Vision/History: Wears glasses Wears Glasses: Reading only       Perception     Praxis      Pertinent Vitals/Pain Pain Assessment: No/denies pain     Hand Dominance Right   Extremity/Trunk Assessment Upper Extremity Assessment Upper Extremity Assessment: Generalized weakness;RUE deficits/detail;LUE deficits/detail RUE: (WFL for AROM, grossly 4-/5) LUE: (WFL for AROM, grossly 4-/5)   Lower Extremity Assessment Lower Extremity Assessment: Defer to PT evaluation       Communication Communication Communication: No difficulties;HOH   Cognition Arousal/Alertness: Awake/alert Behavior During Therapy: WFL for tasks assessed/performed Overall Cognitive Status: Within Functional Limits for tasks assessed                                     General Comments  Exercises Exercises: Other exercises Other Exercises Other Exercises: (flutter valve for 10 reps- 4-5 seconds) Other Exercises: incentive spirometer- 1730mL   Shoulder Instructions      Home Living Family/patient expects to be discharged to:: Private residence Living Arrangements: Alone(1 dog)   Type of Home: House Home Access: Stairs to enter [employer] of Steps: (3) Entrance Stairs-Rails: Can reach both Home Layout: Multi-level;Able to live on main level with bedroom/bathroom     Bathroom Shower/Tub: Animal nutritionist: Standard Bathroom Accessibility: Yes How Accessible: Accessible via wheelchair;Accessible via walker Home Equipment: Grab bars - tub/shower(has a cane, but was not using at PLOF )   Additional Comments: (Son lives about 1 mile from patient. 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         OT Problem List: Decreased strength;Decreased activity tolerance;Impaired balance (sitting and/or standing);Decreased knowledge of use of DME or AE;Decreased knowledge of precautions;Cardiopulmonary status limiting activity      OT Treatment/Interventions: Self-care/ADL training;Therapeutic exercise;Energy conservation;DME and/or AE instruction;Therapeutic activities;Patient/family education;Balance training    OT Goals(Current goals can be found in the care plan section) Acute Rehab OT Goals Patient Stated Goal: to be able to return home  OT Goal Formulation: With patient Time For Goal Achievement: 09/18/19 Potential to Achieve Goals: Good  OT Frequency: Min 3X/week   Barriers to D/C:            Co-evaluation              AM-PAC OT "6 Clicks" Daily Activity     Outcome Measure Help from another person eating meals?: A Little Help from another person taking care of personal grooming?: A Little Help from another person toileting, which includes using toliet, bedpan, or urinal?: A Little Help from another person bathing (including washing, rinsing, drying)?: A Little Help from another person to put on and taking off regular upper body clothing?: A Little Help from another person to put on and taking off regular lower body clothing?: A Little 6 Click Score: 18   End of Session Equipment Utilized During Treatment: Rolling walker;Gait belt Nurse Communication: Mobility status(O2 stats during mobility )  Activity Tolerance: Patient tolerated treatment well Patient left: in  chair;with call bell/phone within reach  OT Visit Diagnosis: Muscle weakness (generalized) (M62.81);Unsteadiness on feet (R26.81)                Time: [id number] OT Time Calculation (min): 47 min Charges:  OT General Charges $OT Visit: 1 Visit OT Evaluation $OT Eval Moderate Complexity: 1 Mod OT Treatments $Self Care/Home Management : 8-22 mins $Therapeutic Exercise: 8-22 mins  Bette Brienza OTR/L  Syndey Jaskolski 09/04/2019, 2:42 PM

## 2019-09-04 NOTE — Evaluation (Signed)
Physical Therapy Evaluation Patient Details Name: Steven Gutierrez MRN: [id number] DOB: 1933-04-03 Today's Date: 09/04/2019   History of Present Illness  Pt is an 86 y.o. male admitted 09/01/19 with difficulty breathing, fever and cough; (+) COVID-19, CXR with moderate bilateral infiltrates (L>R). PMH includes afib, HTN, gout.    Clinical Impression  Pt presents with an overall decrease in functional mobility secondary to above. PTA, independent, active and lives alone; son lives nearby. Today, pt moving well; supervision-level for lines and to monitor SpO2/HR. Pt limited by decreased activity tolerance; SpO2 down to 80% on 6L O2 with activity, requiring ~2-min seated rest and deep breathing to recover to 88-92%. Educ on activity recommendations, seated/standing therex, precautions, positioning and importance of mobility. Pt motivated to participate and regain PLOF. Will follow acutely to address established goals.    Follow Up Recommendations Home health PT(vs. no PT, pending progression)    Equipment Recommendations  None recommended by PT    Recommendations for Other Services       Precautions / Restrictions Precautions Precautions: Fall Precaution Comments: Watch SpO2 and HR Restrictions Weight Bearing Restrictions: No      Mobility  Bed Mobility               General bed mobility comments: Received sitting in recliner  Transfers Overall transfer level: Independent Equipment used: None Transfers: Sit to/from Stand              Ambulation/Gait Ambulation/Gait assistance: Supervision Gait Distance (Feet): 60 Feet Assistive device: None Gait Pattern/deviations: Step-through pattern;Decreased stride length   Gait velocity interpretation: 1.31 - 2.62 ft/sec, indicative of limited community ambulator General Gait Details: Slow, steady gait without DME; supervision for lines and monitoring O2/HR. SpO2 down to 80% on 6L O2 HFNC, returning to 88-92% with ~75min seated  rest break and cues for pursed lip breathing.  Stairs            Wheelchair Mobility    Modified Rankin (Stroke Patients Only)       Balance Overall balance assessment: Needs assistance   Sitting balance-Leahy Scale: Good       Standing balance-Leahy Scale: Good                               Pertinent Vitals/Pain Pain Assessment: No/denies pain    Home Living Family/patient expects to be discharged to:: Private residence Living Arrangements: Alone Available Help at Discharge: Family;Friend(s);Available PRN/intermittently Type of Home: House Home Access: Stairs to enter Entrance Stairs-Rails: Can reach both Entrance Stairs-Number of Steps: 3 Home Layout: Multi-level;Able to live on main level with bedroom/bathroom Home Equipment: Grab bars - tub/shower;Cane - single point Additional Comments: Son lives ~1 mile from patient.    Prior Function Level of Independence: Independent         Comments: enjoys walking; used to be very active, but now primarily walks in driveway. Has a dog     Hand Dominance        Extremity/Trunk Assessment   Upper Extremity Assessment Upper Extremity Assessment: Overall WFL for tasks assessed    Lower Extremity Assessment Lower Extremity Assessment: Overall WFL for tasks assessed       Communication   Communication: HOH  Cognition Arousal/Alertness: Awake/alert Behavior During Therapy: WFL for tasks assessed/performed Overall Cognitive Status: Within Functional Limits for tasks assessed  General Comments      Exercises Other Exercises Other Exercises: Great technique with IS and flutter valve, no cues Other Exercises: Educ on seated/standing therex within confines of lines - LAQ, seated hip flex, repeated sit<>stands, marching in place   Assessment/Plan    PT Assessment Patient needs continued PT services  PT Problem List Decreased activity  tolerance;Decreased balance;Decreased mobility;Cardiopulmonary status limiting activity       PT Treatment Interventions DME instruction;Gait training;Stair training;Functional mobility training;Therapeutic activities;Therapeutic exercise;Balance training;Patient/family education    PT Goals (Current goals can be found in the Care Plan section)  Acute Rehab PT Goals Patient Stated Goal: Return home, get back to walking PT Goal Formulation: With patient Time For Goal Achievement: 09/18/19 Potential to Achieve Goals: Good    Frequency Min 3X/week   Barriers to discharge        Co-evaluation               AM-PAC PT "6 Clicks" Mobility  Outcome Measure Help needed turning from your back to your side while in a flat bed without using bedrails?: None Help needed moving from lying on your back to sitting on the side of a flat bed without using bedrails?: None Help needed moving to and from a bed to a chair (including a wheelchair)?: None Help needed standing up from a chair using your arms (e.g., wheelchair or bedside chair)?: None Help needed to walk in hospital room?: A Little Help needed climbing 3-5 steps with a railing? : A Little 6 Click Score: 22    End of Session Equipment Utilized During Treatment: Oxygen Activity Tolerance: Patient tolerated treatment well Patient left: in chair;with call bell/phone within reach Nurse Communication: Mobility status PT Visit Diagnosis: Other abnormalities of gait and mobility (R26.89)    Time: [id number] PT Time Calculation (min) (ACUTE ONLY): 28 min   Charges:   PT Evaluation $PT Eval Moderate Complexity: 1 Mod PT Treatments $Therapeutic Exercise: 8-22 mins   Mabeline Caras, PT, DPT Acute Rehabilitation Services  Pager [phone] Office [place] 09/04/2019, 5:23 PM

## 2019-09-05 ENCOUNTER — Encounter (HOSPITAL_COMMUNITY): Payer: Self-pay | Admitting: Internal Medicine

## 2019-09-05 LAB — COMPREHENSIVE METABOLIC PANEL
ALT: 52 U/L — ABNORMAL HIGH (ref 0–44)
AST: 79 U/L — ABNORMAL HIGH (ref 15–41)
Albumin: 3.5 g/dL (ref 3.5–5.0)
Alkaline Phosphatase: 120 U/L (ref 38–126)
Anion gap: 10 (ref 5–15)
BUN: 38 mg/dL — ABNORMAL HIGH (ref 8–23)
CO2: 25 mmol/L (ref 22–32)
Calcium: 8.8 mg/dL — ABNORMAL LOW (ref 8.9–10.3)
Chloride: 102 mmol/L (ref 98–111)
Creatinine, Ser: 1.03 mg/dL (ref 0.61–1.24)
GFR calc Af Amer: >60 mL/min (ref >60)
GFR calc non Af Amer: >60 mL/min (ref >60)
Glucose, Bld: 134 mg/dL — ABNORMAL HIGH (ref 70–99)
Potassium: 4.1 mmol/L (ref 3.5–5.1)
Sodium: 137 mmol/L (ref 135–145)
Total Bilirubin: 0.7 mg/dL (ref 0.3–1.2)
Total Protein: 7.1 g/dL (ref 6.5–8.1)

## 2019-09-05 LAB — CBC WITH DIFFERENTIAL/PLATELET
Abs Immature Granulocytes: 0.36 10*3/uL — ABNORMAL HIGH (ref 0.00–0.07)
Basophils Absolute: 0 10*3/uL (ref 0.0–0.1)
Basophils Relative: 0 %
Eosinophils Absolute: 0.1 10*3/uL (ref 0.0–0.5)
Eosinophils Relative: 1 %
HCT: 29.7 % — ABNORMAL LOW (ref 39.0–52.0)
Hemoglobin: 8.9 g/dL — ABNORMAL LOW (ref 13.0–17.0)
Immature Granulocytes: 3 %
Lymphocytes Relative: 4 %
Lymphs Abs: 0.5 10*3/uL — ABNORMAL LOW (ref 0.7–4.0)
MCH: 24.2 pg — ABNORMAL LOW (ref 26.0–34.0)
MCHC: 30 g/dL (ref 30.0–36.0)
MCV: 80.7 fL (ref 80.0–100.0)
Monocytes Absolute: 0.4 10*3/uL (ref 0.1–1.0)
Monocytes Relative: 3 %
Neutro Abs: 9.7 10*3/uL — ABNORMAL HIGH (ref 1.7–7.7)
Neutrophils Relative %: 89 %
Platelets: 264 10*3/uL (ref 150–400)
RBC: 3.68 MIL/uL — ABNORMAL LOW (ref 4.22–5.81)
RDW: 16.6 % — ABNORMAL HIGH (ref 11.5–15.5)
WBC: 11 10*3/uL — ABNORMAL HIGH (ref 4.0–10.5)
nRBC: 0.4 % — ABNORMAL HIGH (ref 0.0–0.2)

## 2019-09-05 LAB — LACTATE DEHYDROGENASE: LDH: 363 U/L — ABNORMAL HIGH (ref 98–192)

## 2019-09-05 LAB — FERRITIN: Ferritin: 91 ng/mL (ref 24–336)

## 2019-09-05 LAB — D-DIMER, QUANTITATIVE: D-Dimer, Quant: 1.88 ug/mL-FEU — ABNORMAL HIGH (ref 0.00–0.50)

## 2019-09-05 LAB — C-REACTIVE PROTEIN: CRP: 7.3 mg/dL — ABNORMAL HIGH (ref <1.0)

## 2019-09-05 LAB — MAGNESIUM: Magnesium: 2.2 mg/dL (ref 1.7–2.4)

## 2019-09-05 MED ORDER — FUROSEMIDE 10 MG/ML IJ SOLN
40.0000 mg | Freq: Once | INTRAMUSCULAR | Status: AC
  Administered 2019-09-05: 40 mg via INTRAVENOUS
  Filled 2019-09-05: qty 4

## 2019-09-05 NOTE — Progress Notes (Signed)
PROGRESS NOTE    Steven Gutierrez  [id number] DOB: [birth year] DOA: 09/01/2019 PCP: [name], MD   Brief Narrative:  86 year old with history of atrial fibrillation, HTN, skin cancer came to the hospital with complaints of shortness of breath which had been ongoing for a week.  Tested negative for COVID-19 in the outpatient and was treated with Augmentin but due to progression of his symptoms he came to the hospital.  He was noted to be hypoxic in the ER requiring nonrebreather.  Chest x-ray showed bilateral infiltrates.  He was started on IV Rocephin and azithromycin and admitted to the hospital.  Upon admission he was placed on vancomycin and cefepime, Decadron and remdesivir.  Also received Actemra and convalescent plasma.   Assessment & Plan:   Principal Problem:   Pneumonia due to COVID-19 virus Active Problems:   Unspecified atrial fibrillation (HCC)   Essential hypertension  Acute hypoxic respiratory failure secondary to COVID-19 pneumonia; still persist.  -Oxygen levels-Still on 12L HF , Was able to wean it down to 6L HF while I was in the room.  -Remdesivir-day 4 -Decadrone-day 4 -Routine: Labs have been reviewed including ferritin, LDH, CRP, d-dimer, fibrinogen.  Will need to trend this lab daily. -Vitamin C & Zinc. Prone >16hrs/day.  -procalcitonin-0.28 -MRSA screen negative, discontinue vancomycin -Chest x-ray-diffuse bilateral infiltrates -Supportive care-antitussive, inhalers, I-S/flutter -CODE STATUS confirmed -Status post Actemra and convalescent plasma  Atrial fibrillation -Rate controlled, on Xarelto  Essential hypertension -Norvasc daily  History of gout -Allopurinol  Hypothyroidism -Synthroid   DVT prophylaxis: Xarelto  Code Status: Full Code  Family Communication: Spoke with Son.  Disposition Plan:   Patient From= home  Patient Anticipated D/C place= Home  Barriers= Still on high flow nasal cannula with fluctuating oxygen level requiring  as high as 12 L of high flow.  Unsafe for discharge given his increase in oxygen requirement.  Maintain hospital stay.   Subjective: Patient is sitting out in the chair feeling okay.  Saturating greater than 95% on 12 L high flow therefore I have weaned him down to 6 L high flow while I was in the room.  He is oxygen levels were sustaining 90%.  Patient at rest did not have any complaints but does have exertional shortness of breath.  Tells me he has mucus is more clear now.  Review of Systems Otherwise negative except as per HPI, including: General = no fevers, chills, dizziness,  fatigue HEENT/EYES = negative for loss of vision, double vision, blurred vision,  sore throa Cardiovascular= negative for chest pain, palpitation Respiratory/lungs= negative forwheezing; hemoptysis,  Gastrointestinal= negative for nausea, vomiting, abdominal pain Genitourinary= negative for Dysuria MSK = Negative for arthralgia, myalgias Neurology= Negative for headache, numbness, tingling  Psychiatry= Negative for suicidal and homocidal ideation Skin= Negative for Rash  Examination: Constitutional: Not in acute distress, initially on 12 L high flow weaned down to 6 L high flow Respiratory: Diffuse bilateral rhonchi Cardiovascular: Normal sinus rhythm, no rubs Abdomen: Nontender nondistended good bowel sounds Musculoskeletal: No edema noted Skin: No rashes seen Neurologic: CN 2-12 grossly intact.  And nonfocal Psychiatric: Normal judgment and insight. Alert and oriented x 3. Normal mood.   Objective: Vitals:   09/05/19 0552 09/05/19 0554 09/05/19 0600 09/05/19 0724  BP:    (!) 146/71  Pulse:    84  Resp:    (!) 26  Temp:    97.8 F (36.6 C)  TempSrc:    Oral  SpO2: (!) 84% (!) 87% 90% Marland Kitchen)  89%  Weight:      Height:        Intake/Output Summary (Last 24 hours) at 09/05/2019 0959 Last data filed at 09/05/2019 0600 Gross per 24 hour  Intake 677.41 ml  Output --  Net 677.41 ml   Filed Weights    09/01/19 1414 09/01/19 1428  Weight: 86.2 kg 88.5 kg     Data Reviewed:   CBC: Recent Labs  Lab 09/01/19 1523 09/02/19 0225 09/03/19 0245 09/04/19 0412 09/05/19 0133  WBC 10.7* 9.7 15.5* 14.7* 11.0*  NEUTROABS 9.7* 9.0* 14.4* 13.3* 9.7*  HGB 9.5* 9.4* 9.8* 9.5* 8.9*  HCT 31.4* 31.1* 32.3* 31.0* 29.7*  MCV 82.0 81.6 81.0 79.7* 80.7  PLT 224 234 272 291 [id number]   Basic Metabolic Panel: Recent Labs  Lab 09/01/19 1523 09/02/19 0225 09/03/19 0245 09/04/19 0412 09/05/19 0133  NA 131* 137 138 138 137  K 3.5 3.8 4.0 3.4* 4.1  CL 97* 102 104 102 102  CO2 24 23 23 25 25   GLUCOSE 101* 172* 122* 120* 134*  BUN 13 16 30* 36* 38*  CREATININE 0.89 1.12 0.94 1.16 1.03  CALCIUM 8.7* 8.7* 9.0 9.0 8.8*  MG  --   --  2.2 2.1 2.2   GFR: Estimated Creatinine Clearance: 58.6 mL/min (by C-G formula based on SCr of 1.03 mg/dL). Liver Function Tests: Recent Labs  Lab 09/01/19 1523 09/02/19 0225 09/03/19 0245 09/04/19 0412 09/05/19 0133  AST 43* 40 46* 52* 79*  ALT 22 21 26  32 52*  ALKPHOS 128* 113 125 121 120  BILITOT 0.8 0.7 0.3 0.4 0.7  PROT 7.5 7.3 7.3 7.6 7.1  ALBUMIN 3.9 3.5 3.5 3.6 3.5   No results for input(s): LIPASE, AMYLASE in the last 168 hours. No results for input(s): AMMONIA in the last 168 hours. Coagulation Profile: No results for input(s): INR, PROTIME in the last 168 hours. Cardiac Enzymes: No results for input(s): CKTOTAL, CKMB, CKMBINDEX, TROPONINI in the last 168 hours. BNP (last 3 results) No results for input(s): PROBNP in the last 8760 hours. HbA1C: No results for input(s): HGBA1C in the last 72 hours. CBG: No results for input(s): GLUCAP in the last 168 hours. Lipid Profile: No results for input(s): CHOL, HDL, LDLCALC, TRIG, CHOLHDL, LDLDIRECT in the last 72 hours. Thyroid Function Tests: No results for input(s): TSH, T4TOTAL, FREET4, T3FREE, THYROIDAB in the last 72 hours. Anemia Panel: Recent Labs    09/04/19 0412 09/05/19 0133  FERRITIN 94  91   Sepsis Labs: Recent Labs  Lab 09/01/19 1522 09/01/19 1523 09/01/19 1709  PROCALCITON  --  0.28  --   LATICACIDVEN 1.4  --  1.2    Recent Results (from the past 240 hour(s))  Respiratory Panel by RT PCR (Flu A&B, Covid) - Nasopharyngeal Swab     Status: Abnormal   Collection Time: 09/01/19  3:21 PM   Specimen: Nasopharyngeal Swab  Result Value Ref Range Status   SARS Coronavirus 2 by RT PCR POSITIVE (A) NEGATIVE Final    Comment: RESULT CALLED TO, READ BACK BY AND VERIFIED WITH: PATRAW,B@1810  BY MATTHEWS, B 2.2.2021 (NOTE) SARS-CoV-2 target nucleic acids are DETECTED. SARS-CoV-2 RNA is generally detectable in upper respiratory specimens  during the acute phase of infection. Positive results are indicative of the presence of the identified virus, but do not rule out bacterial infection or co-infection with other pathogens not detected by the test. Clinical correlation with patient history and other diagnostic information is necessary to determine patient infection status.  The expected result is Negative. Fact Sheet for Patients:  PinkCheek.be Fact Sheet for Healthcare Providers: GravelBags.it This test is not yet approved or cleared by the [place] FDA and  has been authorized for detection and/or diagnosis of SARS-CoV-2 by FDA under an Emergency Use Authorization (EUA).  This EUA will remain in effect (meaning this test can be used)  for the duration of  the COVID-19 declaration under Section 564(b)(1) of the Act, 21 U.S.C. section 360bbb-3(b)(1), unless the authorization is terminated or revoked sooner.    Influenza A by PCR NEGATIVE NEGATIVE Final   Influenza B by PCR NEGATIVE NEGATIVE Final    Comment: (NOTE) The Xpert Xpress SARS-CoV-2/FLU/RSV assay is intended as an aid in  the diagnosis of influenza from Nasopharyngeal swab specimens and  should not be used as a sole basis for treatment. Nasal  washings and  aspirates are unacceptable for Xpert Xpress SARS-CoV-2/FLU/RSV  testing. Fact Sheet for Patients: PinkCheek.be Fact Sheet for Healthcare Providers: GravelBags.it This test is not yet approved or cleared by the [place] FDA and  has been authorized for detection and/or diagnosis of SARS-CoV-2 by  FDA under an Emergency Use Authorization (EUA). This EUA will remain  in effect (meaning this test can be used) for the duration of the  Covid-19 declaration under Section 564(b)(1) of the Act, 21  U.S.C. section 360bbb-3(b)(1), unless the authorization is  terminated or revoked. Performed at [facility], [address] [place], [place] [address]   Blood Culture (routine x 2)     Status: None (Preliminary result)   Collection Time: 09/01/19  3:22 PM   Specimen: BLOOD RIGHT FOREARM  Result Value Ref Range Status   Specimen Description BLOOD RIGHT FOREARM  Final   Special Requests   Final    BOTTLES DRAWN AEROBIC AND ANAEROBIC Blood Culture adequate volume   Culture   Final    NO GROWTH 4 DAYS Performed at [facility], [address], [place], [place] [address]    Report Status PENDING  Incomplete  Blood Culture (routine x 2)     Status: None (Preliminary result)   Collection Time: 09/01/19  5:09 PM   Specimen: Left Antecubital; Blood  Result Value Ref Range Status   Specimen Description LEFT ANTECUBITAL  Final   Special Requests   Final    BOTTLES DRAWN AEROBIC AND ANAEROBIC Blood Culture adequate volume   Culture   Final    NO GROWTH 4 DAYS Performed at [facility], [address] [place], [place] [address]    Report Status PENDING  Incomplete  MRSA PCR Screening     Status: None   Collection Time: 09/02/19  1:41 AM   Specimen: Nasal Mucosa; Nasopharyngeal  Result Value Ref Range Status   MRSA by PCR NEGATIVE NEGATIVE Final    Comment:        The GeneXpert MRSA Assay (FDA approved for NASAL  specimens only), is one component of a comprehensive MRSA colonization surveillance program. It is not intended to diagnose MRSA infection nor to guide or monitor treatment for MRSA infections. Performed at [facility], [place] [address] [place], [place] [address]          Radiology Studies: DG Chest Port 1 View  Result Date: 09/04/2019 CLINICAL DATA:  Dyspnea, history of fever EXAM: PORTABLE CHEST 1 VIEW COMPARISON:  09/01/2019 FINDINGS: Single frontal view of the chest demonstrates a stable cardiac silhouette. The dense areas of consolidation seen within the perihilar regions previously  have been replaced by diffuse interstitial and ground-glass opacities, now most pronounced in the left infrahilar region. No effusion or pneumothorax. No acute bony abnormalities. IMPRESSION: 1. Diffuse interstitial and ground-glass opacities as above. Appearance would favor atypical viral pneumonia. Electronically Signed   By: Randa Ngo M.D.   On: 09/04/2019 08:56        Scheduled Meds: . allopurinol  300 mg Oral Daily  . amLODipine  10 mg Oral Daily  . vitamin C  500 mg Oral Daily  . dexamethasone (DECADRON) injection  6 mg Intravenous Q24H  . dextromethorphan-guaiFENesin  1 tablet Oral BID  . feeding supplement (ENSURE ENLIVE)  237 mL Oral TID BM  . ferrous sulfate  325 mg Oral BID WC  . Ipratropium-Albuterol  1 puff Inhalation Q6H  . levothyroxine  50 mcg Oral Q0600  . nystatin  5 mL Mouth/Throat QID  . pantoprazole  40 mg Oral Daily  . rivaroxaban  20 mg Oral Q supper  . timolol  1 drop Left Eye Daily  . zinc sulfate  220 mg Oral Daily   Continuous Infusions: . ceFEPime (MAXIPIME) IV 2 g (09/05/19 0624)  . remdesivir 100 mg in NS 100 mL 100 mg (09/05/19 0955)     LOS: 4 days   Time spent= 35 mins    Shyonna Carlin Arsenio Loader, MD [facility]  If 7PM-7AM, please contact night-coverage  09/05/2019, 9:59 AM

## 2019-09-06 LAB — CBC WITH DIFFERENTIAL/PLATELET
Abs Immature Granulocytes: 0.77 10*3/uL — ABNORMAL HIGH (ref 0.00–0.07)
Basophils Absolute: 0.1 10*3/uL (ref 0.0–0.1)
Basophils Relative: 1 %
Eosinophils Absolute: 0 10*3/uL (ref 0.0–0.5)
Eosinophils Relative: 0 %
HCT: 29.5 % — ABNORMAL LOW (ref 39.0–52.0)
Hemoglobin: 9.2 g/dL — ABNORMAL LOW (ref 13.0–17.0)
Immature Granulocytes: 5 %
Lymphocytes Relative: 5 %
Lymphs Abs: 0.7 10*3/uL (ref 0.7–4.0)
MCH: 24.8 pg — ABNORMAL LOW (ref 26.0–34.0)
MCHC: 31.2 g/dL (ref 30.0–36.0)
MCV: 79.5 fL — ABNORMAL LOW (ref 80.0–100.0)
Monocytes Absolute: 0.6 10*3/uL (ref 0.1–1.0)
Monocytes Relative: 4 %
Neutro Abs: 12.2 10*3/uL — ABNORMAL HIGH (ref 1.7–7.7)
Neutrophils Relative %: 85 %
Platelets: 308 10*3/uL (ref 150–400)
RBC: 3.71 MIL/uL — ABNORMAL LOW (ref 4.22–5.81)
RDW: 16.7 % — ABNORMAL HIGH (ref 11.5–15.5)
WBC: 14.4 10*3/uL — ABNORMAL HIGH (ref 4.0–10.5)
nRBC: 0.7 % — ABNORMAL HIGH (ref 0.0–0.2)

## 2019-09-06 LAB — COMPREHENSIVE METABOLIC PANEL
ALT: 50 U/L — ABNORMAL HIGH (ref 0–44)
AST: 61 U/L — ABNORMAL HIGH (ref 15–41)
Albumin: 3.6 g/dL (ref 3.5–5.0)
Alkaline Phosphatase: 131 U/L — ABNORMAL HIGH (ref 38–126)
Anion gap: 10 (ref 5–15)
BUN: 41 mg/dL — ABNORMAL HIGH (ref 8–23)
CO2: 26 mmol/L (ref 22–32)
Calcium: 9 mg/dL (ref 8.9–10.3)
Chloride: 101 mmol/L (ref 98–111)
Creatinine, Ser: 0.95 mg/dL (ref 0.61–1.24)
GFR calc Af Amer: >60 mL/min (ref >60)
GFR calc non Af Amer: >60 mL/min (ref >60)
Glucose, Bld: 141 mg/dL — ABNORMAL HIGH (ref 70–99)
Potassium: 4.1 mmol/L (ref 3.5–5.1)
Sodium: 137 mmol/L (ref 135–145)
Total Bilirubin: 0.8 mg/dL (ref 0.3–1.2)
Total Protein: 7.1 g/dL (ref 6.5–8.1)

## 2019-09-06 LAB — CULTURE, BLOOD (ROUTINE X 2)
Culture: NO GROWTH
Culture: NO GROWTH
Special Requests: ADEQUATE
Special Requests: ADEQUATE

## 2019-09-06 LAB — FERRITIN: Ferritin: 75 ng/mL (ref 24–336)

## 2019-09-06 LAB — D-DIMER, QUANTITATIVE: D-Dimer, Quant: 2.54 ug/mL-FEU — ABNORMAL HIGH (ref 0.00–0.50)

## 2019-09-06 LAB — C-REACTIVE PROTEIN: CRP: 4.2 mg/dL — ABNORMAL HIGH (ref <1.0)

## 2019-09-06 LAB — LACTATE DEHYDROGENASE: LDH: 376 U/L — ABNORMAL HIGH (ref 98–192)

## 2019-09-06 LAB — MAGNESIUM: Magnesium: 2.4 mg/dL (ref 1.7–2.4)

## 2019-09-06 MED ORDER — FUROSEMIDE 10 MG/ML IJ SOLN
40.0000 mg | Freq: Once | INTRAMUSCULAR | Status: AC
  Administered 2019-09-06: 40 mg via INTRAVENOUS
  Filled 2019-09-06: qty 4

## 2019-09-06 NOTE — Progress Notes (Signed)
SATURATION QUALIFICATIONS: (This note is used to comply with regulatory documentation for home oxygen)  Patient Saturations on Room Air at Rest =90 %  Patient Saturations on Room Air while Ambulating = 85%  Patient Saturations on 2 Liters of oxygen while Ambulating = 92%  Please briefly explain why patient needs home oxygen:  Patient desaturates on ambulation and requires oxygen.

## 2019-09-06 NOTE — Progress Notes (Signed)
PROGRESS NOTE    Steven Gutierrez  [id number] DOB: 06-17-1933 DOA: 09/01/2019 PCP: [name], MD   Brief Narrative:  86 year old with history of atrial fibrillation, HTN, skin cancer came to the hospital with complaints of shortness of breath which had been ongoing for a week.  Tested negative for COVID-19 in the outpatient and was treated with Augmentin but due to progression of his symptoms he came to the hospital.  He was noted to be hypoxic in the ER requiring nonrebreather.  Chest x-ray showed bilateral infiltrates.  He was started on IV Rocephin and azithromycin and admitted to the hospital.  Upon admission he was placed on vancomycin and cefepime, Decadron and remdesivir.  Also received Actemra and convalescent plasma.   Assessment & Plan:   Principal Problem:   Pneumonia due to COVID-19 virus Active Problems:   Unspecified atrial fibrillation (HCC)   Essential hypertension  Acute hypoxic respiratory failure secondary to COVID-19 pneumonia; still persist -Oxygen levels-overnight required 10 L of high flow nasal cannula.  This morning I have weaned him down to 4 L of high flow nasal cannula. -Remdesivir-day 5 -Decadrone-day 5 -Routine: Labs have been reviewed including ferritin, LDH, CRP, d-dimer, fibrinogen.  Will need to trend this lab daily. -Vitamin C & Zinc. Prone >16hrs/day.  -procalcitonin-0.28 -MRSA screen negative, discontinue vancomycin -Chest x-ray-diffuse bilateral infiltrates -Supportive care-antitussive, inhalers, I-S/flutter -CODE STATUS confirmed -Status post Actemra and convalescent plasma -Lasix 40 mg IV, 1 dose  Atrial fibrillation -Rate controlled, on Xarelto  Essential hypertension -Norvasc daily  History of gout -Allopurinol  Hypothyroidism -Synthroid  Iron deficiency anemia, microcytic -Iron supplements.   DVT prophylaxis: Xarelto  Code Status: Full Code  Family Communication: None Disposition Plan:   Patient From= home  Patient  Anticipated D/C place= Home  Barriers= oxygen level still fluctuating between 4 L - 10 L high flow nasal cannula.  Dyspneic and hypoxic especially with minimal ambulation therefore unsafe for discharge.   Subjective: While sitting in the chair does not report of any shortness of breath.  When I walked in the room he was on 10 L high flow which I weaned down to 4 L and he was saturating greater than 90%.  States he feels slightly better therefore he will walk around his room low bit more today and see how he feels.  Review of Systems Otherwise negative except as per HPI, including: General = no fevers, chills, dizziness,  fatigue HEENT/EYES = negative for loss of vision, double vision, blurred vision,  sore throa Cardiovascular= negative for chest pain, palpitation Respiratory/lungs= negative for  hemoptysis,  Gastrointestinal= negative for nausea, vomiting, abdominal pain Genitourinary= negative for Dysuria MSK = Negative for arthralgia, myalgias Neurology= Negative for headache, numbness, tingling  Psychiatry= Negative for suicidal and homocidal ideation Skin= Negative for Rash  Examination: Constitutional: Not in acute distress, 10 L high flow weaned down to 4 L. Respiratory: Bilateral diffuse rhonchi. Cardiovascular: Normal sinus rhythm, no rubs Abdomen: Nontender nondistended good bowel sounds Musculoskeletal: No edema noted Skin: No rashes seen Neurologic: CN 2-12 grossly intact.  And nonfocal Psychiatric: Normal judgment and insight. Alert and oriented x 3. Normal mood.     Objective: Vitals:   09/05/19 1957 09/06/19 0000 09/06/19 0400 09/06/19 0740  BP: 117/66 125/73 127/78 137/74  Pulse: 75 68 67   Resp: (!) 22 19 (!) 22   Temp: 97.6 F (36.4 C) 98.1 F (36.7 C) 97.8 F (36.6 C) 97.7 F (36.5 C)  TempSrc: Oral Oral Oral Oral  SpO2:  92% 93%   Weight:      Height:        Intake/Output Summary (Last 24 hours) at 09/06/2019 1032 Last data filed at 09/06/2019  0600 Gross per 24 hour  Intake 672 ml  Output 1820 ml  Net -1148 ml   Filed Weights   09/01/19 1414 09/01/19 1428  Weight: 86.2 kg 88.5 kg     Data Reviewed:   CBC: Recent Labs  Lab 09/02/19 0225 09/03/19 0245 09/04/19 0412 09/05/19 0133 09/06/19 0430  WBC 9.7 15.5* 14.7* 11.0* 14.4*  NEUTROABS 9.0* 14.4* 13.3* 9.7* 12.2*  HGB 9.4* 9.8* 9.5* 8.9* 9.2*  HCT 31.1* 32.3* 31.0* 29.7* 29.5*  MCV 81.6 81.0 79.7* 80.7 79.5*  PLT 234 272 291 264 [id number]   Basic Metabolic Panel: Recent Labs  Lab 09/02/19 0225 09/03/19 0245 09/04/19 0412 09/05/19 0133 09/06/19 0430  NA 137 138 138 137 137  K 3.8 4.0 3.4* 4.1 4.1  CL 102 104 102 102 101  CO2 23 23 25 25 26   GLUCOSE 172* 122* 120* 134* 141*  BUN 16 30* 36* 38* 41*  CREATININE 1.12 0.94 1.16 1.03 0.95  CALCIUM 8.7* 9.0 9.0 8.8* 9.0  MG  --  2.2 2.1 2.2 2.4   GFR: Estimated Creatinine Clearance: 63.5 mL/min (by C-G formula based on SCr of 0.95 mg/dL). Liver Function Tests: Recent Labs  Lab 09/02/19 0225 09/03/19 0245 09/04/19 0412 09/05/19 0133 09/06/19 0430  AST 40 46* 52* 79* 61*  ALT 21 26 32 52* 50*  ALKPHOS 113 125 121 120 131*  BILITOT 0.7 0.3 0.4 0.7 0.8  PROT 7.3 7.3 7.6 7.1 7.1  ALBUMIN 3.5 3.5 3.6 3.5 3.6   No results for input(s): LIPASE, AMYLASE in the last 168 hours. No results for input(s): AMMONIA in the last 168 hours. Coagulation Profile: No results for input(s): INR, PROTIME in the last 168 hours. Cardiac Enzymes: No results for input(s): CKTOTAL, CKMB, CKMBINDEX, TROPONINI in the last 168 hours. BNP (last 3 results) No results for input(s): PROBNP in the last 8760 hours. HbA1C: No results for input(s): HGBA1C in the last 72 hours. CBG: No results for input(s): GLUCAP in the last 168 hours. Lipid Profile: No results for input(s): CHOL, HDL, LDLCALC, TRIG, CHOLHDL, LDLDIRECT in the last 72 hours. Thyroid Function Tests: No results for input(s): TSH, T4TOTAL, FREET4, T3FREE, THYROIDAB in  the last 72 hours. Anemia Panel: Recent Labs    09/05/19 0133 09/06/19 0430  FERRITIN 91 75   Sepsis Labs: Recent Labs  Lab 09/01/19 1522 09/01/19 1523 09/01/19 1709  PROCALCITON  --  0.28  --   LATICACIDVEN 1.4  --  1.2    Recent Results (from the past 240 hour(s))  Respiratory Panel by RT PCR (Flu A&B, Covid) - Nasopharyngeal Swab     Status: Abnormal   Collection Time: 09/01/19  3:21 PM   Specimen: Nasopharyngeal Swab  Result Value Ref Range Status   SARS Coronavirus 2 by RT PCR POSITIVE (A) NEGATIVE Final    Comment: RESULT CALLED TO, READ BACK BY AND VERIFIED WITH: PATRAW,B@1810  BY MATTHEWS, B 2.2.2021 (NOTE) SARS-CoV-2 target nucleic acids are DETECTED. SARS-CoV-2 RNA is generally detectable in upper respiratory specimens  during the acute phase of infection. Positive results are indicative of the presence of the identified virus, but do not rule out bacterial infection or co-infection with other pathogens not detected by the test. Clinical correlation with patient history and other diagnostic information is necessary to determine  patient infection status. The expected result is Negative. Fact Sheet for Patients:  PinkCheek.be Fact Sheet for Healthcare Providers: GravelBags.it This test is not yet approved or cleared by the [place] FDA and  has been authorized for detection and/or diagnosis of SARS-CoV-2 by FDA under an Emergency Use Authorization (EUA).  This EUA will remain in effect (meaning this test can be used)  for the duration of  the COVID-19 declaration under Section 564(b)(1) of the Act, 21 U.S.C. section 360bbb-3(b)(1), unless the authorization is terminated or revoked sooner.    Influenza A by PCR NEGATIVE NEGATIVE Final   Influenza B by PCR NEGATIVE NEGATIVE Final    Comment: (NOTE) The Xpert Xpress SARS-CoV-2/FLU/RSV assay is intended as an aid in  the diagnosis of influenza from  Nasopharyngeal swab specimens and  should not be used as a sole basis for treatment. Nasal washings and  aspirates are unacceptable for Xpert Xpress SARS-CoV-2/FLU/RSV  testing. Fact Sheet for Patients: PinkCheek.be Fact Sheet for Healthcare Providers: GravelBags.it This test is not yet approved or cleared by the [place] FDA and  has been authorized for detection and/or diagnosis of SARS-CoV-2 by  FDA under an Emergency Use Authorization (EUA). This EUA will remain  in effect (meaning this test can be used) for the duration of the  Covid-19 declaration under Section 564(b)(1) of the Act, 21  U.S.C. section 360bbb-3(b)(1), unless the authorization is  terminated or revoked. Performed at [facility], [address] [place], [place] [address]   Blood Culture (routine x 2)     Status: None (Preliminary result)   Collection Time: 09/01/19  3:22 PM   Specimen: BLOOD RIGHT FOREARM  Result Value Ref Range Status   Specimen Description BLOOD RIGHT FOREARM  Final   Special Requests   Final    BOTTLES DRAWN AEROBIC AND ANAEROBIC Blood Culture adequate volume   Culture   Final    NO GROWTH 4 DAYS Performed at [facility], [address], [place], [place] [address]    Report Status PENDING  Incomplete  Blood Culture (routine x 2)     Status: None (Preliminary result)   Collection Time: 09/01/19  5:09 PM   Specimen: Left Antecubital; Blood  Result Value Ref Range Status   Specimen Description LEFT ANTECUBITAL  Final   Special Requests   Final    BOTTLES DRAWN AEROBIC AND ANAEROBIC Blood Culture adequate volume   Culture   Final    NO GROWTH 4 DAYS Performed at [facility], [address] [place], [place] [address]    Report Status PENDING  Incomplete  MRSA PCR Screening     Status: None   Collection Time: 09/02/19  1:41 AM   Specimen: Nasal Mucosa; Nasopharyngeal  Result Value Ref Range Status   MRSA by PCR NEGATIVE  NEGATIVE Final    Comment:        The GeneXpert MRSA Assay (FDA approved for NASAL specimens only), is one component of a comprehensive MRSA colonization surveillance program. It is not intended to diagnose MRSA infection nor to guide or monitor treatment for MRSA infections. Performed at [facility], [place] [address] [place],  [address]          Radiology Studies: No results found.      Scheduled Meds: . allopurinol  300 mg Oral Daily  . amLODipine  10 mg Oral Daily  . vitamin C  500 mg Oral Daily  . dexamethasone (DECADRON) injection  6 mg Intravenous Q24H  .  dextromethorphan-guaiFENesin  1 tablet Oral BID  . feeding supplement (ENSURE ENLIVE)  237 mL Oral TID BM  . ferrous sulfate  325 mg Oral BID WC  . Ipratropium-Albuterol  1 puff Inhalation Q6H  . levothyroxine  50 mcg Oral Q0600  . nystatin  5 mL Mouth/Throat QID  . pantoprazole  40 mg Oral Daily  . rivaroxaban  20 mg Oral Q supper  . timolol  1 drop Left Eye Daily  . zinc sulfate  220 mg Oral Daily   Continuous Infusions: . ceFEPime (MAXIPIME) IV 2 g (09/06/19 0709)     LOS: 5 days   Time spent= 25 mins    Virlee Stroschein Arsenio Loader, MD [facility]  If 7PM-7AM, please contact night-coverage  09/06/2019, 10:32 AM

## 2019-09-07 LAB — LACTATE DEHYDROGENASE: LDH: 397 U/L — ABNORMAL HIGH (ref 98–192)

## 2019-09-07 LAB — MAGNESIUM: Magnesium: 2.4 mg/dL (ref 1.7–2.4)

## 2019-09-07 MED ORDER — IPRATROPIUM-ALBUTEROL 20-100 MCG/ACT IN AERS: 1.0000 | INHALATION_SPRAY | Freq: Four times a day (QID) | RESPIRATORY_TRACT | 1 refills | Status: DC

## 2019-09-07 MED ORDER — FERROUS SULFATE 325 (65 FE) MG PO TABS: 325.0000 mg | ORAL_TABLET | Freq: Two times a day (BID) | ORAL | 1 refills | Status: DC

## 2019-09-07 MED ORDER — PREDNISONE 20 MG PO TABS: 40.0000 mg | ORAL_TABLET | Freq: Every day | ORAL | 0 refills | Status: AC

## 2019-09-07 NOTE — Progress Notes (Signed)
Occupational Therapy Treatment Patient Details Name: Steven Gutierrez MRN: [id number] DOB: 07-24-1933 Today's Date: 09/07/2019    History of present illness Pt is an 86 y.o. male admitted 09/01/19 with difficulty breathing, fever and cough; (+) COVID-19, CXR with moderate bilateral infiltrates (L>R). PMH includes afib, HTN, gout.   OT comments  Patient demonstrates progress with O2 stats while sitting in room on room air, but drops to the 80s during functional activities and self-care tasks. Patient was noted with dynamic standing balance deficits requiring the use of furniture to maintain balance in room. Recommend patient use rolling walker upon d/c to improve balance and fall prevention. Patient was able to complete grooming tasks in static standing while recalling energy conservation techniques listed on handout. Patient was able to mobilize about 230 feet with rolling walker on 1L with O2 stats at or above 90% with 1 rest break.   Follow Up Recommendations  Home health OT    Equipment Recommendations  3 in 1 bedside commode    Recommendations for Other Services      Precautions / Restrictions Precautions Precautions: Fall Restrictions Weight Bearing Restrictions: No       Mobility Bed Mobility                  Transfers Overall transfer level: Modified independent       Stand pivot transfers: Modified independent (Device/Increase time)            Balance Overall balance assessment: Needs assistance   Sitting balance-Leahy Scale: Normal       Standing balance-Leahy Scale: Good(recommend use of RW during long distances) Standing balance comment: (With the use of RW to maintain balance )                           ADL either performed or assessed with clinical judgement   ADL       Grooming: Oral care;Wash/dry hands                               Functional mobility during ADLs: Supervision/safety General ADL Comments: (Modified  Independent with grooming task while standing )     Vision       Perception     Praxis      Cognition Arousal/Alertness: Awake/alert Behavior During Therapy: WFL for tasks assessed/performed                                            Exercises Exercises: Other exercises Other Exercises Other Exercises: (Flutter Valve for 10 reps- 5-6 seconds ) Other Exercises: incentive spirometer- 2021mL   Shoulder Instructions       General Comments Education and Given Handout on Energy conservation     Pertinent Vitals/ Pain       Pain Assessment: No/denies pain  Home Living                                          Prior Functioning/Environment              Frequency           Progress Toward Goals  OT Goals(current goals can now be found in the care  plan section)  Progress towards OT goals: Progressing toward goals     Plan      Co-evaluation                 AM-PAC OT "6 Clicks" Daily Activity     Outcome Measure   Help from another person eating meals?: None Help from another person taking care of personal grooming?: None Help from another person toileting, which includes using toliet, bedpan, or urinal?: None Help from another person bathing (including washing, rinsing, drying)?: A Little Help from another person to put on and taking off regular upper body clothing?: None Help from another person to put on and taking off regular lower body clothing?: None 6 Click Score: 23    End of Session Equipment Utilized During Treatment: Rolling walker;Oxygen      Activity Tolerance Patient tolerated treatment well   Patient Left in chair;with call bell/phone within reach   Nurse Communication Mobility status(O2 stats)        Time: [id number] OT Time Calculation (min): 42 min  Charges: OT General Charges $OT Visit: 1 Visit OT Treatments $Self Care/Home Management : 8-22 mins $Therapeutic Activity: 8-22  mins $Therapeutic Exercise: 8-22 mins  Jamall Strohmeier OTR/L    Oliviya Gilkison 09/07/2019, 12:35 PM

## 2019-09-07 NOTE — TOC Transition Note (Signed)
Transition of Care [facility]) - CM/SW Discharge Note   Patient Details  Name: Steven Gutierrez MRN: [id number] Date of Birth: 1933/04/13  Transition of Care [facility] CM/SW Contact:  Atilano Median, LCSW Phone Number: 09/07/2019, 12:56 PM   Clinical Narrative:     Discharged home with home health and oxygen. Patient's son states that he works with EMS and has portable tanks and also has access to other portable tanks until the concentrator can be delivered to the home.   CSW spoke with Joellen Jersey at [facility] [phone] who states that the concentrator will be delivered today. However, she is unable to give a specific time frame.   Home health start of care scheduled for tomorrow or Wednesday.   Patient and son [name] aware and agreeable to this plan. No other needs at this time. Case closed to this CSW.   Final next level of care: Home w Home Health Services Barriers to Discharge: Barriers Resolved   Patient Goals and CMS Choice   CMS Medicare.gov Compare Post Acute Care list provided to:: Patient Represenative (must comment)(Johnny Jerelene Redden) Choice offered to / list presented to : Patient, Adult Children  Discharge Placement                  Name of family member notified: Johnny Patient and family notified of of transfer: 09/07/19  Discharge Plan and Services                DME Arranged: Oxygen DME Agency: Other - Comment([facility]) Date DME Agency Contacted: 09/07/19 Time DME Agency Contacted: [id number] Representative spoke with at DME Agency: [facility] Arranged: OT, PT [place] Agency: [place] Date [place]: 09/07/19 Time [place]: [id number] Representative spoke with at [place]: [place] ([place]) Interventions     Readmission Risk Interventions No flowsheet data found.

## 2019-09-07 NOTE — Progress Notes (Signed)
Patient and son [name] were given discharge information and they state understanding. Patient discharged [id number] and last set of vitals were stable. Patient was on room air upon discharge and no signs of distress noted.

## 2019-09-07 NOTE — Progress Notes (Signed)
SATURATION QUALIFICATIONS: (This note is used to comply with regulatory documentation for home oxygen)  Patient Saturations on Room Air at Rest = 95%  Patient Saturations on Room Air while Ambulating = 85%  Patient Saturations on 1 Liters of oxygen while Ambulating = 90%  Please briefly explain why patient needs home oxygen: Patient required frequent rest breaks when mobilizing with room air, about 1 minute recovery time to 90% on room air. The lowest that patient dropped on 1L of O2 was 90% with only 1 rest break for a distance of about 230 feet using rolling walker.   Hendrixx Severin OTR/L

## 2019-09-07 NOTE — Discharge Summary (Signed)
Physician Discharge Summary  Steven Gutierrez [id number] DOB: 06/09/1933 DOA: 09/01/2019  PCP: [name], MD  Admit date: 09/01/2019 Discharge date: 09/07/2019  Admitted From: Home Disposition: Home  Recommendations for Outpatient Follow-up:  1. Follow up with PCP in 1-2 weeks 2. Please obtain BMP/CBC in one week your next doctors visit.  3. Iron supplements prescribed.  Advised to continue using home bowel regimen as needed 4. Oral prednisone for 5 days.  Bronchodilators prescribed to be used as needed  Home Health: None Equipment/Devices: 2 L home oxygen Discharge Condition: Stable CODE STATUS: Full code Diet recommendation: 2 g salt  Brief/Interim Summary: 86 year old with history of atrial fibrillation, HTN, skin cancer came to the hospital with complaints of shortness of breath which had been ongoing for a week.  Tested negative for COVID-19 in the outpatient and was treated with Augmentin but due to progression of his symptoms he came to the hospital.  He was noted to be hypoxic in the ER requiring nonrebreather.  Chest x-ray showed bilateral infiltrates.  He was started on IV Rocephin and azithromycin and admitted to the hospital.  Upon admission he was placed on vancomycin and cefepime, Decadron and remdesivir.  Also received Actemra and convalescent plasma.  He also completed 5-day course of remdesivir in the hospital, discharged on 5 more days of oral prednisone.  Intermittently required diuresis in the hospital.  Symptomatically feeling much better.  Ambulatory pulse ox only drops down to 85% on room air requiring 2 L nasal cannula therefore arrangements made for home oxygen.  Rest of the recommendations as stated above. Patient stable to be discharged home.  Physical therapy recommended home health therefore arrangements made.  Acute hypoxic respiratory failure secondary to COVID-19 pneumonia; greatly improved -Now patient is on room air at rest, requiring 2 L nasal cannula  with ambulation.  Completed 5-day course of remdesivir.  Will require 5 more days of oral prednisone upon discharge -Bronchodilators prescribed.  Continue supportive care -Status post Actemra and convalescent plasma  Atrial fibrillation -Rate controlled, on Xarelto  Essential hypertension -Norvasc daily  History of gout -Allopurinol  Hypothyroidism -Synthroid  Iron deficiency anemia, microcytic -Iron supplements.   Discharge Diagnoses:  Principal Problem:   Pneumonia due to COVID-19 virus Active Problems:   Unspecified atrial fibrillation [facility])   Essential hypertension    Consultations:  None  Subjective: Feels great, wishes to go home.  Remains on room air.  Discharge Exam: Vitals:   09/07/19 0400 09/07/19 0816  BP:  123/71  Pulse: 63   Resp: 19 18  Temp: 98.4 F (36.9 C) (!) 97.4 F (36.3 C)  SpO2: 95% 91%   Vitals:   09/06/19 1935 09/07/19 0100 09/07/19 0400 09/07/19 0816  BP: 128/68 128/65  123/71  Pulse: 66 66 63   Resp: 20 17 19 18   Temp: 97.9 F (36.6 C) 98.2 F (36.8 C) 98.4 F (36.9 C) (!) 97.4 F (36.3 C)  TempSrc: Oral Oral Oral Oral  SpO2: 95% 94% 95% 91%  Weight:      Height:        General: Pt is alert, awake, not in acute distress Cardiovascular: RRR, S1/S2 +, no rubs, no gallops Respiratory: CTA bilaterally, no wheezing, no rhonchi Abdominal: Soft, NT, ND, bowel sounds + Extremities: no edema, no cyanosis  Discharge Instructions  Discharge Instructions    For home use only DME oxygen   Complete by: As directed    Length of Need: 6 Months   Mode or (Route):  Nasal cannula   Liters per Minute: 2   Oxygen delivery system: Gas     Allergies as of 09/07/2019      Reactions   Oxycodone Nausea And Vomiting      Medication List    TAKE these medications   acetaminophen 500 MG tablet Commonly known as: TYLENOL Take 500-1,000 mg by mouth every 6 (six) hours as needed (for pain.).   allopurinol 300 MG tablet Commonly  known as: ZYLOPRIM Take 300 mg by mouth daily.   amLODipine 10 MG tablet Commonly known as: NORVASC Take 10 mg by mouth daily.   amoxicillin-clavulanate 875-125 MG tablet Commonly known as: AUGMENTIN Take 1 tablet by mouth 2 (two) times daily.   benzonatate 100 MG capsule Commonly known as: TESSALON Take 100 mg by mouth 3 (three) times daily as needed for cough.   ferrous sulfate 325 (65 FE) MG tablet Take 1 tablet (325 mg total) by mouth 2 (two) times daily with a meal.   Ipratropium-Albuterol 20-100 MCG/ACT Aers respimat Commonly known as: COMBIVENT Inhale 1 puff into the lungs every 6 (six) hours.   latanoprost 0.005 % ophthalmic solution Commonly known as: XALATAN Place 1 drop into both eyes at bedtime.   levothyroxine 50 MCG tablet Commonly known as: SYNTHROID Take 50 mcg by mouth daily before breakfast.   multivitamin with minerals Tabs tablet Take 1 tablet by mouth daily.   naproxen 500 MG tablet Commonly known as: NAPROSYN Take 500 mg by mouth 2 (two) times daily as needed (pain.).   pantoprazole 40 MG tablet Commonly known as: PROTONIX Take 40 mg by mouth 2 (two) times daily.   polyethylene glycol 17 g packet Commonly known as: MIRALAX / GLYCOLAX Take 17 g by mouth daily as needed (constipation.).   predniSONE 20 MG tablet Commonly known as: DELTASONE Take 2 tablets (40 mg total) by mouth daily with breakfast for 5 days.   rivaroxaban 20 MG Tabs tablet Commonly known as: XARELTO Take 20 mg by mouth every evening.            Durable Medical Equipment  (From admission, onward)         Start     Ordered   09/07/19 0000  For home use only DME oxygen    Question Answer Comment  Length of Need 6 Months   Mode or (Route) Nasal cannula   Liters per Minute 2   Oxygen delivery system Gas      09/07/19 1123         Follow-up Information    [name], MD. Schedule an appointment as soon as possible for a visit in 2 week(s).   Specialty:  Family Medicine Contact information: [address] [place] [address] [phone]          Allergies  Allergen Reactions  . Oxycodone Nausea And Vomiting    You were cared for by a hospitalist during your hospital stay. If you have any questions about your discharge medications or the care you received while you were in the hospital after you are discharged, you can call the unit and asked to speak with the hospitalist on call if the hospitalist that took care of you is not available. Once you are discharged, your primary care physician will handle any further medical issues. Please note that no refills for any discharge medications will be authorized once you are discharged, as it is imperative that you return to your primary care physician (or establish a relationship with  a primary care physician if you do not have one) for your aftercare needs so that they can reassess your need for medications and monitor your lab values.   Procedures/Studies: DG Chest Port 1 View  Result Date: 09/04/2019 CLINICAL DATA:  Dyspnea, history of fever EXAM: PORTABLE CHEST 1 VIEW COMPARISON:  09/01/2019 FINDINGS: Single frontal view of the chest demonstrates a stable cardiac silhouette. The dense areas of consolidation seen within the perihilar regions previously have been replaced by diffuse interstitial and ground-glass opacities, now most pronounced in the left infrahilar region. No effusion or pneumothorax. No acute bony abnormalities. IMPRESSION: 1. Diffuse interstitial and ground-glass opacities as above. Appearance would favor atypical viral pneumonia. Electronically Signed   By: Randa Ngo M.D.   On: 09/04/2019 08:56   DG Chest Port 1 View  Result Date: 09/01/2019 CLINICAL DATA:  Dyspnea. EXAM: PORTABLE CHEST 1 VIEW COMPARISON:  None. FINDINGS: Moderate severity patchy infiltrate is seen within the mid left lung with mild infiltrates seen within the mid to lower right lung and left lung base.  There is no evidence of a pleural effusion or pneumothorax. The cardiac silhouette is borderline in size. There is moderate severity calcification of the aortic arch. The visualized skeletal structures are unremarkable. IMPRESSION: 1. Moderate severity bilateral infiltrates, left greater than right. Electronically Signed   By: Virgina Norfolk M.D.   On: 09/01/2019 15:56      The results of significant diagnostics from this hospitalization (including imaging, microbiology, ancillary and laboratory) are listed below for reference.     Microbiology: Recent Results (from the past 240 hour(s))  Respiratory Panel by RT PCR (Flu A&B, Covid) - Nasopharyngeal Swab     Status: Abnormal   Collection Time: 09/01/19  3:21 PM   Specimen: Nasopharyngeal Swab  Result Value Ref Range Status   SARS Coronavirus 2 by RT PCR POSITIVE (A) NEGATIVE Final    Comment: RESULT CALLED TO, READ BACK BY AND VERIFIED WITH: PATRAW,B@1810  BY MATTHEWS, B 2.2.2021 (NOTE) SARS-CoV-2 target nucleic acids are DETECTED. SARS-CoV-2 RNA is generally detectable in upper respiratory specimens  during the acute phase of infection. Positive results are indicative of the presence of the identified virus, but do not rule out bacterial infection or co-infection with other pathogens not detected by the test. Clinical correlation with patient history and other diagnostic information is necessary to determine patient infection status. The expected result is Negative. Fact Sheet for Patients:  PinkCheek.be Fact Sheet for Healthcare Providers: GravelBags.it This test is not yet approved or cleared by the [place] FDA and  has been authorized for detection and/or diagnosis of SARS-CoV-2 by FDA under an Emergency Use Authorization (EUA).  This EUA will remain in effect (meaning this test can be used)  for the duration of  the COVID-19 declaration under Section 564(b)(1) of  the Act, 21 U.S.C. section 360bbb-3(b)(1), unless the authorization is terminated or revoked sooner.    Influenza A by PCR NEGATIVE NEGATIVE Final   Influenza B by PCR NEGATIVE NEGATIVE Final    Comment: (NOTE) The Xpert Xpress SARS-CoV-2/FLU/RSV assay is intended as an aid in  the diagnosis of influenza from Nasopharyngeal swab specimens and  should not be used as a sole basis for treatment. Nasal washings and  aspirates are unacceptable for Xpert Xpress SARS-CoV-2/FLU/RSV  testing. Fact Sheet for Patients: PinkCheek.be Fact Sheet for Healthcare Providers: GravelBags.it This test is not yet approved or cleared by the [place] FDA and  has been authorized for detection and/or  diagnosis of SARS-CoV-2 by  FDA under an Emergency Use Authorization (EUA). This EUA will remain  in effect (meaning this test can be used) for the duration of the  Covid-19 declaration under Section 564(b)(1) of the Act, 21  U.S.C. section 360bbb-3(b)(1), unless the authorization is  terminated or revoked. Performed at [facility], [address] [place], [place] [address]   Blood Culture (routine x 2)     Status: None   Collection Time: 09/01/19  3:22 PM   Specimen: BLOOD RIGHT FOREARM  Result Value Ref Range Status   Specimen Description BLOOD RIGHT FOREARM  Final   Special Requests   Final    BOTTLES DRAWN AEROBIC AND ANAEROBIC Blood Culture adequate volume   Culture   Final    NO GROWTH 5 DAYS Performed at [facility], [address] [place], [place] [address]    Report Status 09/06/2019 FINAL  Final  Blood Culture (routine x 2)     Status: None   Collection Time: 09/01/19  5:09 PM   Specimen: Left Antecubital; Blood  Result Value Ref Range Status   Specimen Description LEFT ANTECUBITAL  Final   Special Requests   Final    BOTTLES DRAWN AEROBIC AND ANAEROBIC Blood Culture adequate volume   Culture   Final    NO GROWTH 5  DAYS Performed at [facility], [address] [place], [place] [address]    Report Status 09/06/2019 FINAL  Final  MRSA PCR Screening     Status: None   Collection Time: 09/02/19  1:41 AM   Specimen: Nasal Mucosa; Nasopharyngeal  Result Value Ref Range Status   MRSA by PCR NEGATIVE NEGATIVE Final    Comment:        The GeneXpert MRSA Assay (FDA approved for NASAL specimens only), is one component of a comprehensive MRSA colonization surveillance program. It is not intended to diagnose MRSA infection nor to guide or monitor treatment for MRSA infections. Performed at [facility], [place] [address], [place], [place] [address]      Labs: BNP (last 3 results) Recent Labs    09/03/19 0245  BNP 123XX123*   Basic Metabolic Panel: Recent Labs  Lab 09/02/19 0225 09/03/19 0245 09/04/19 0412 09/05/19 0133 09/06/19 0430 09/07/19 0422  NA 137 138 138 137 137  --   K 3.8 4.0 3.4* 4.1 4.1  --   CL 102 104 102 102 101  --   CO2 23 23 25 25 26   --   GLUCOSE 172* 122* 120* 134* 141*  --   BUN 16 30* 36* 38* 41*  --   CREATININE 1.12 0.94 1.16 1.03 0.95  --   CALCIUM 8.7* 9.0 9.0 8.8* 9.0  --   MG  --  2.2 2.1 2.2 2.4 2.4   Liver Function Tests: Recent Labs  Lab 09/02/19 0225 09/03/19 0245 09/04/19 0412 09/05/19 0133 09/06/19 0430  AST 40 46* 52* 79* 61*  ALT 21 26 32 52* 50*  ALKPHOS 113 125 121 120 131*  BILITOT 0.7 0.3 0.4 0.7 0.8  PROT 7.3 7.3 7.6 7.1 7.1  ALBUMIN 3.5 3.5 3.6 3.5 3.6   No results for input(s): LIPASE, AMYLASE in the last 168 hours. No results for input(s): AMMONIA in the last 168 hours. CBC: Recent Labs  Lab 09/02/19 0225 09/03/19 0245 09/04/19 0412 09/05/19 0133 09/06/19 0430  WBC 9.7 15.5* 14.7* 11.0* 14.4*  NEUTROABS 9.0* 14.4* 13.3* 9.7* 12.2*  HGB 9.4* 9.8* 9.5* 8.9* 9.2*  HCT 31.1* 32.3* 31.0* 29.7* 29.5*  MCV 81.6 81.0 79.7* 80.7 79.5*  PLT 234 272 291 264 308   Cardiac Enzymes: No results for input(s): CKTOTAL,  CKMB, CKMBINDEX, TROPONINI in the last 168 hours. BNP: Invalid input(s): POCBNP CBG: No results for input(s): GLUCAP in the last 168 hours. D-Dimer Recent Labs    09/05/19 0133 09/06/19 0430  DDIMER 1.88* 2.54*   Hgb A1c No results for input(s): HGBA1C in the last 72 hours. Lipid Profile No results for input(s): CHOL, HDL, LDLCALC, TRIG, CHOLHDL, LDLDIRECT in the last 72 hours. Thyroid function studies No results for input(s): TSH, T4TOTAL, T3FREE, THYROIDAB in the last 72 hours.  Invalid input(s): FREET3 Anemia work up Recent Labs    09/05/19 0133 09/06/19 0430  FERRITIN 91 75   Urinalysis No results found for: COLORURINE, APPEARANCEUR, LABSPEC, [place], GLUCOSEU, [place], [place], [place], PROTEINUR, UROBILINOGEN, NITRITE, LEUKOCYTESUR Sepsis Labs Invalid input(s): PROCALCITONIN,  WBC,  LACTICIDVEN Microbiology Recent Results (from the past 240 hour(s))  Respiratory Panel by RT PCR (Flu A&B, Covid) - Nasopharyngeal Swab     Status: Abnormal   Collection Time: 09/01/19  3:21 PM   Specimen: Nasopharyngeal Swab  Result Value Ref Range Status   SARS Coronavirus 2 by RT PCR POSITIVE (A) NEGATIVE Final    Comment: RESULT CALLED TO, READ BACK BY AND VERIFIED WITH: PATRAW,B@1810  BY MATTHEWS, B 2.2.2021 (NOTE) SARS-CoV-2 target nucleic acids are DETECTED. SARS-CoV-2 RNA is generally detectable in upper respiratory specimens  during the acute phase of infection. Positive results are indicative of the presence of the identified virus, but do not rule out bacterial infection or co-infection with other pathogens not detected by the test. Clinical correlation with patient history and other diagnostic information is necessary to determine patient infection status. The expected result is Negative. Fact Sheet for Patients:  PinkCheek.be Fact Sheet for Healthcare Providers: GravelBags.it This test is not yet approved or  cleared by the [place] FDA and  has been authorized for detection and/or diagnosis of SARS-CoV-2 by FDA under an Emergency Use Authorization (EUA).  This EUA will remain in effect (meaning this test can be used)  for the duration of  the COVID-19 declaration under Section 564(b)(1) of the Act, 21 U.S.C. section 360bbb-3(b)(1), unless the authorization is terminated or revoked sooner.    Influenza A by PCR NEGATIVE NEGATIVE Final   Influenza B by PCR NEGATIVE NEGATIVE Final    Comment: (NOTE) The Xpert Xpress SARS-CoV-2/FLU/RSV assay is intended as an aid in  the diagnosis of influenza from Nasopharyngeal swab specimens and  should not be used as a sole basis for treatment. Nasal washings and  aspirates are unacceptable for Xpert Xpress SARS-CoV-2/FLU/RSV  testing. Fact Sheet for Patients: PinkCheek.be Fact Sheet for Healthcare Providers: GravelBags.it This test is not yet approved or cleared by the [place] FDA and  has been authorized for detection and/or diagnosis of SARS-CoV-2 by  FDA under an Emergency Use Authorization (EUA). This EUA will remain  in effect (meaning this test can be used) for the duration of the  Covid-19 declaration under Section 564(b)(1) of the Act, 21  U.S.C. section 360bbb-3(b)(1), unless the authorization is  terminated or revoked. Performed at [facility], [address] [place], [place] [address]   Blood Culture (routine x 2)     Status: None   Collection Time: 09/01/19  3:22 PM   Specimen: BLOOD RIGHT FOREARM  Result Value Ref Range Status   Specimen Description BLOOD RIGHT FOREARM  Final  Special Requests   Final    BOTTLES DRAWN AEROBIC AND ANAEROBIC Blood Culture adequate volume   Culture   Final    NO GROWTH 5 DAYS Performed at [facility], [address] [place], [place] [address]    Report Status 09/06/2019 FINAL  Final  Blood Culture (routine x 2)     Status: None    Collection Time: 09/01/19  5:09 PM   Specimen: Left Antecubital; Blood  Result Value Ref Range Status   Specimen Description LEFT ANTECUBITAL  Final   Special Requests   Final    BOTTLES DRAWN AEROBIC AND ANAEROBIC Blood Culture adequate volume   Culture   Final    NO GROWTH 5 DAYS Performed at [facility], [address] [place], [place] [address]    Report Status 09/06/2019 FINAL  Final  MRSA PCR Screening     Status: None   Collection Time: 09/02/19  1:41 AM   Specimen: Nasal Mucosa; Nasopharyngeal  Result Value Ref Range Status   MRSA by PCR NEGATIVE NEGATIVE Final    Comment:        The GeneXpert MRSA Assay (FDA approved for NASAL specimens only), is one component of a comprehensive MRSA colonization surveillance program. It is not intended to diagnose MRSA infection nor to guide or monitor treatment for MRSA infections. Performed at [facility], [place] [address] [place], [place] [address]      Time coordinating discharge:  I have spent 35 minutes face to face with the patient and on the ward discussing the patients care, assessment, plan and disposition with other care givers. >50% of the time was devoted counseling the patient about the risks and benefits of treatment/Discharge disposition and coordinating care.   SIGNED:   Damita Lack, MD  [facility] 09/07/2019, 11:23 AM   If 7PM-7AM, please contact night-coverage

## 2019-10-21 ENCOUNTER — Other Ambulatory Visit: Payer: Self-pay

## 2019-10-21 ENCOUNTER — Emergency Department (HOSPITAL_COMMUNITY): Payer: Medicare Other

## 2019-10-21 ENCOUNTER — Emergency Department (HOSPITAL_COMMUNITY)
Admission: EM | Admit: 2019-10-21 | Discharge: 2019-10-21 | Disposition: A | Discharge status: Home / Self Care | Payer: Medicare Other | Attending: Emergency Medicine | Admitting: Emergency Medicine

## 2019-10-21 DIAGNOSIS — Z79899 Other long term (current) drug therapy: Secondary | ICD-10-CM | POA: Diagnosis not present

## 2019-10-21 DIAGNOSIS — I10 Essential (primary) hypertension: Secondary | ICD-10-CM | POA: Insufficient documentation

## 2019-10-21 DIAGNOSIS — C7951 Secondary malignant neoplasm of bone: Secondary | ICD-10-CM | POA: Insufficient documentation

## 2019-10-21 DIAGNOSIS — M5441 Lumbago with sciatica, right side: Secondary | ICD-10-CM | POA: Diagnosis not present

## 2019-10-21 DIAGNOSIS — Z7901 Long term (current) use of anticoagulants: Secondary | ICD-10-CM | POA: Insufficient documentation

## 2019-10-21 DIAGNOSIS — C799 Secondary malignant neoplasm of unspecified site: Secondary | ICD-10-CM

## 2019-10-21 DIAGNOSIS — Z87891 Personal history of nicotine dependence: Secondary | ICD-10-CM | POA: Insufficient documentation

## 2019-10-21 DIAGNOSIS — Z85828 Personal history of other malignant neoplasm of skin: Secondary | ICD-10-CM | POA: Insufficient documentation

## 2019-10-21 DIAGNOSIS — M545 Low back pain: Secondary | ICD-10-CM | POA: Diagnosis present

## 2019-10-21 LAB — BASIC METABOLIC PANEL
Anion gap: 12 (ref 5–15)
BUN: 19 mg/dL (ref 8–23)
CO2: 23 mmol/L (ref 22–32)
Calcium: 8.7 mg/dL — ABNORMAL LOW (ref 8.9–10.3)
Chloride: 97 mmol/L — ABNORMAL LOW (ref 98–111)
Creatinine, Ser: 1.01 mg/dL (ref 0.61–1.24)
GFR calc Af Amer: >60 mL/min (ref >60)
GFR calc non Af Amer: >60 mL/min (ref >60)
Glucose, Bld: 106 mg/dL — ABNORMAL HIGH (ref 70–99)
Potassium: 3.7 mmol/L (ref 3.5–5.1)
Sodium: 132 mmol/L — ABNORMAL LOW (ref 135–145)

## 2019-10-21 LAB — CBC WITH DIFFERENTIAL/PLATELET
Abs Immature Granulocytes: 0.11 10*3/uL — ABNORMAL HIGH (ref 0.00–0.07)
Basophils Absolute: 0 10*3/uL (ref 0.0–0.1)
Basophils Relative: 0 %
Eosinophils Absolute: 0 10*3/uL (ref 0.0–0.5)
Eosinophils Relative: 0 %
HCT: 35 % — ABNORMAL LOW (ref 39.0–52.0)
Hemoglobin: 10.5 g/dL — ABNORMAL LOW (ref 13.0–17.0)
Immature Granulocytes: 1 %
Lymphocytes Relative: 19 %
Lymphs Abs: 2.1 10*3/uL (ref 0.7–4.0)
MCH: 24.4 pg — ABNORMAL LOW (ref 26.0–34.0)
MCHC: 30 g/dL (ref 30.0–36.0)
MCV: 81.4 fL (ref 80.0–100.0)
Monocytes Absolute: 1.6 10*3/uL — ABNORMAL HIGH (ref 0.1–1.0)
Monocytes Relative: 14 %
Neutro Abs: 7.2 10*3/uL (ref 1.7–7.7)
Neutrophils Relative %: 66 %
Platelets: 381 10*3/uL (ref 150–400)
RBC: 4.3 MIL/uL (ref 4.22–5.81)
RDW: 19 % — ABNORMAL HIGH (ref 11.5–15.5)
WBC: 11 10*3/uL — ABNORMAL HIGH (ref 4.0–10.5)
nRBC: 0 % (ref 0.0–0.2)

## 2019-10-21 LAB — URINALYSIS, ROUTINE W REFLEX MICROSCOPIC
Bacteria, UA: NONE SEEN
Bilirubin Urine: NEGATIVE
Glucose, UA: NEGATIVE mg/dL
Hgb urine dipstick: NEGATIVE
Ketones, ur: NEGATIVE mg/dL
Leukocytes,Ua: NEGATIVE
Nitrite: NEGATIVE
Protein, ur: 100 mg/dL — AB
Specific Gravity, Urine: 1.026 (ref 1.005–1.030)
pH: 5 (ref 5.0–8.0)

## 2019-10-21 MED ORDER — LIDOCAINE 5 % EX PTCH: 1.0000 | MEDICATED_PATCH | CUTANEOUS | 0 refills | Status: AC

## 2019-10-21 MED ORDER — ONDANSETRON HCL 4 MG/2ML IJ SOLN
4.0000 mg | Freq: Once | INTRAMUSCULAR | Status: AC
  Administered 2019-10-21: 4 mg via INTRAVENOUS
  Filled 2019-10-21: qty 2

## 2019-10-21 MED ORDER — ONDANSETRON 4 MG PO TBDP: 4.0000 mg | ORAL_TABLET | Freq: Three times a day (TID) | ORAL | 0 refills | Status: AC | PRN

## 2019-10-21 MED ORDER — HYDROCODONE-ACETAMINOPHEN 5-325 MG PO TABS: 2.0000 | ORAL_TABLET | ORAL | 0 refills | Status: DC | PRN

## 2019-10-21 MED ORDER — SODIUM CHLORIDE 0.9 % IV BOLUS
1000.0000 mL | Freq: Once | INTRAVENOUS | Status: AC
  Administered 2019-10-21: 1000 mL via INTRAVENOUS

## 2019-10-21 MED ORDER — FENTANYL CITRATE (PF) 100 MCG/2ML IJ SOLN
50.0000 ug | Freq: Once | INTRAMUSCULAR | Status: AC
  Administered 2019-10-21: 50 ug via INTRAVENOUS
  Filled 2019-10-21: qty 2

## 2019-10-21 MED ORDER — LORAZEPAM 2 MG/ML IJ SOLN
1.0000 mg | Freq: Once | INTRAMUSCULAR | Status: AC
  Administered 2019-10-21: 21:00:00 1 mg via INTRAMUSCULAR
  Filled 2019-10-21: qty 1

## 2019-10-21 NOTE — ED Notes (Signed)
Pt transported to MRI 

## 2019-10-21 NOTE — ED Triage Notes (Addendum)
Pt c/o of lower back pain with a cough and low grade fever that began 3 days ago. Patient tested positive for Covid on Sep 01, 2019

## 2019-10-21 NOTE — Discharge Instructions (Signed)
Follow up with the Oncologist. Return for new or worsening symptoms

## 2019-10-21 NOTE — ED Notes (Signed)
Pt reports lower back pain, worse with movement and at nighttime. Pt reports difficulty sleeping past 3 days due to pain. Provider at bedside.

## 2019-10-21 NOTE — ED Notes (Signed)
Pt returned from MRI °

## 2019-10-21 NOTE — ED Provider Notes (Signed)
[facility] EMERGENCY DEPARTMENT Provider Note   CSN: [id number] Arrival date & time: 10/21/19  1519     History Chief Complaint  Patient presents with  . Back Pain    Steven Gutierrez is a 86 y.o. male with past medical history significant for atrial fibrillation (On Xarelto), hypertension prior Covid diagnosis who presents for evaluation of back pain.  Patient with midline and right lower back pain which began 3 days ago.  States he has had a fever at home.  Occasionally will radiate into his leg and his right testicle.  No recent injury or falls.  Temp max at home 103 per patient.  Has been taking Tylenol.  Pain worse at night and when he moves.  He denies any saddle paresthesias, IV drug use, bowel or bladder incontinence.  Denies headache, lightheadedness, dizziness, chest pain, shortness of breath, abdominal pain, diarrhea or dysuria.  Denies additional aggravating or alleviating factors.  Denies any difficulty urinating or hematuria.  History obtained from patient past medical record.  No interpreter is used.  HPI     Past Medical History:  Diagnosis Date  . Cancer ([place])    skin cancer  . Hypertension     Patient Active Problem List   Diagnosis Date Noted  . Pneumonia due to COVID-19 virus 09/01/2019  . Unspecified atrial fibrillation ([place]) 09/01/2019  . Essential hypertension 09/01/2019    Past Surgical History:  Procedure Laterality Date  . ESOPHAGOGASTRODUODENOSCOPY  03/2018  . TRANSURETHRAL RESECTION OF PROSTATE  04/21/2019       Family History  Problem Relation Age of Onset  . Heart attack Mother   . Stroke Father   . Heart attack Sister   . Lung cancer Brother     Social History   Tobacco Use  . Smoking status: Former Research scientist (life sciences)  . Smokeless tobacco: Never Used  Substance Use Topics  . Alcohol use: Yes    Alcohol/week: 1.0 standard drinks    Types: 1 Glasses of wine per week  . Drug use: Never    Home Medications Prior to Admission medications    Medication Sig Start Date End Date Taking? Authorizing Provider  acetaminophen (TYLENOL) 500 MG tablet Take 500-1,000 mg by mouth every 6 (six) hours as needed (for pain.).   Yes [provider]  allopurinol (ZYLOPRIM) 300 MG tablet Take 300 mg by mouth daily.   Yes [provider]  amLODipine (NORVASC) 10 MG tablet Take 10 mg by mouth daily.   Yes [provider]  ferrous sulfate 325 (65 FE) MG tablet Take 1 tablet (325 mg total) by mouth 2 (two) times daily with a meal. 09/07/19  Yes Amin, Ankit Chirag, MD  levothyroxine (SYNTHROID) 50 MCG tablet Take 50 mcg by mouth daily before breakfast.   Yes [provider]  Multiple Vitamin (MULTIVITAMIN WITH MINERALS) TABS tablet Take 1 tablet by mouth daily.   Yes [provider]  rivaroxaban (XARELTO) 20 MG TABS tablet Take 25 mg by mouth every morning. 12/23/18  Yes [provider]  timolol (TIMOPTIC) 0.5 % ophthalmic solution Place 1 drop into the left eye every morning. 07/01/19  Yes [provider]  amoxicillin-clavulanate (AUGMENTIN) 875-125 MG tablet Take 1 tablet by mouth 2 (two) times daily.    [provider]  benzonatate (TESSALON) 100 MG capsule Take 100 mg by mouth 3 (three) times daily as needed for cough.    [provider]  HYDROcodone-acetaminophen (NORCO/VICODIN) 5-325 MG tablet Take 2 tablets by mouth  every 4 (four) hours as needed. 10/21/19   Harvel Meskill A, PA-C  Ipratropium-Albuterol (COMBIVENT) 20-100 MCG/ACT AERS respimat Inhale 1 puff into the lungs every 6 (six) hours. 09/07/19   Amin, Ankit Chirag, MD  latanoprost (XALATAN) 0.005 % ophthalmic solution Place 1 drop into both eyes at bedtime.    [provider]  lidocaine (LIDODERM) 5 % Place 1 patch onto the skin daily. Remove & Discard patch within 12 hours or as directed by MD 10/21/19   Avabella Wailes A, PA-C  naproxen (NAPROSYN) 500 MG tablet Take 500 mg by mouth 2 (two) times daily as  needed (pain.).    [provider]  ondansetron (ZOFRAN ODT) 4 MG disintegrating tablet Take 1 tablet (4 mg total) by mouth every 8 (eight) hours as needed for nausea or vomiting. 10/21/19   Hashem Goynes A, PA-C  pantoprazole (PROTONIX) 40 MG tablet Take 40 mg by mouth 2 (two) times daily.    [provider]  polyethylene glycol (MIRALAX / GLYCOLAX) 17 g packet Take 17 g by mouth daily as needed (constipation.).    [provider]  rivaroxaban (XARELTO) 20 MG TABS tablet Take 20 mg by mouth every evening.    [provider]    Allergies    Oxycodone  Review of Systems   Review of Systems  Constitutional: Positive for chills and fever. Negative for activity change, appetite change, diaphoresis, fatigue and unexpected weight change.  HENT: Negative.   Respiratory: Negative.   Cardiovascular: Negative.   Gastrointestinal: Negative.   Genitourinary: Negative.   Musculoskeletal: Positive for back pain.  Skin: Negative.   Neurological: Negative.   All other systems reviewed and are negative.   Physical Exam Updated Vital Signs BP (!) 141/80   Pulse 99   Temp 100 F (37.8 C) (Oral)   Resp (!) 22   Ht 6' (1.829 m)   Wt 88.5 kg   SpO2 100%   BMI 26.45 kg/m   Physical Exam Vitals and nursing note reviewed.  Constitutional:      General: He is not in acute distress.    Appearance: He is well-developed. He is not ill-appearing, toxic-appearing or diaphoretic.  HENT:     Head: Normocephalic and atraumatic.     Nose: Nose normal.     Mouth/Throat:     Mouth: Mucous membranes are moist.     Pharynx: Oropharynx is clear.  Eyes:     Pupils: Pupils are equal, round, and reactive to light.  Cardiovascular:     Rate and Rhythm: Normal rate and regular rhythm.     Pulses: Normal pulses.     Heart sounds: Normal heart sounds.  Pulmonary:     Effort: Pulmonary effort is normal. No respiratory distress.     Breath sounds: Normal breath sounds.    Abdominal:     General: Bowel sounds are normal. There is no distension.     Palpations: Abdomen is soft.     Tenderness: There is no abdominal tenderness. There is no right CVA tenderness, left CVA tenderness, guarding or rebound.     Comments: Soft, nontender palpation.  Negative CVA tap bilaterally.  Normoactive bowel sounds.  Musculoskeletal:        General: Normal range of motion.     Cervical back: Normal, normal range of motion and neck supple.     Thoracic back: Normal.     Lumbar back: Tenderness present. No swelling, edema, deformity, signs of trauma, lacerations, spasms or bony tenderness.  Normal range of motion. No scoliosis.       Back:     Right hip: Normal.     Left hip: Normal.     Right upper leg: Normal.     Left upper leg: Normal.     Comments: Tenderness palpation to midline lumbar and sacral spine as well as right paraspinal musculature.  No overlying skin changes.  Negative straight leg raise.  Skin:    General: Skin is warm and dry.  Neurological:     Mental Status: He is alert.     Comments: Brisk cap refill    ED Results / Procedures / Treatments   Labs (all labs ordered are listed, but only abnormal results are displayed) Labs Reviewed  CBC WITH DIFFERENTIAL/PLATELET - Abnormal; Notable for the following components:      Result Value   WBC 11.0 (*)    Hemoglobin 10.5 (*)    HCT 35.0 (*)    MCH 24.4 (*)    RDW 19.0 (*)    Monocytes Absolute 1.6 (*)    Abs Immature Granulocytes 0.11 (*)    All other components within normal limits  URINALYSIS, ROUTINE W REFLEX MICROSCOPIC - Abnormal; Notable for the following components:   Color, Urine AMBER (*)    Protein, ur 100 (*)    All other components within normal limits  BASIC METABOLIC PANEL - Abnormal; Notable for the following components:   Sodium 132 (*)    Chloride 97 (*)    Glucose, Bld 106 (*)    Calcium 8.7 (*)    All other components within normal limits    EKG None  Radiology MR LUMBAR  SPINE WO CONTRAST  Result Date: 10/21/2019 CLINICAL DATA:  Initial evaluation for acute mid to lower back pain. EXAM: MRI LUMBAR SPINE WITHOUT CONTRAST TECHNIQUE: Multiplanar, multisequence MR imaging of the lumbar spine was performed. No intravenous contrast was administered. COMPARISON:  None available. FINDINGS: Segmentation:  Examination degraded by motion artifact. Standard segmentation. Lowest well-formed disc space labeled the L5-S1 level. Alignment: 3 mm anterolisthesis of L4 on L5, with trace 2 mm anterolisthesis of L5 on S1. Findings are chronic and facet mediated. Vertebrae: Abnormal T1 hypointensity the, stir hyperintense signal intensity seen replacing the L2 vertebral body, consistent with osseous metastasis. No associated pathologic fracture. Additional metastatic lesion seen involving the left posterior aspect of T12 with extension into the left pedicle (series 5, image 13). No other discrete osseous lesions. Underlying bone marrow signal intensity within normal limits. No other abnormal marrow edema. Conus medullaris and cauda equina: Conus extends to the L1 level. Conus and cauda equina appear normal. No epidural tumor or mass. Paraspinal and other soft tissues: Extensive bulky retroperitoneal adenopathy partially visualized, concerning for nodal metastases. Associated scattered soft tissue stranding with mild edema within the adjacent anteromedial aspects of both psoas muscles. Right kidney is atrophic with a few scattered cysts present, largest of which measures 3.5 cm. Disc levels: T11-12: Disc desiccation with minimal disc bulge. Mild facet hypertrophy. No significant stenosis. T12-L1: Disc desiccation with minimal disc bulge. No stenosis. L1-2: Disc desiccation without disc bulge. Mild facet hypertrophy. No stenosis. L2-3: Disc desiccation with minimal disc bulge. Mild facet hypertrophy. No stenosis. L3-4: Disc desiccation with mild disc bulge. Mild facet hypertrophy. No stenosis. L4-5:  Anterolisthesis. Mild diffuse disc bulge with disc desiccation. Superimposed endplate Schmorl's node deformity. Moderate facet and ligament flavum hypertrophy. Resultant severe spinal stenosis. Mild right greater than left L4 foraminal narrowing. L5-S1: Trace anterolisthesis.  Mild diffuse disc bulge with disc desiccation. Moderate facet hypertrophy. Resultant mild bilateral lateral recess stenosis. Mild bilateral L5 foraminal narrowing. IMPRESSION: 1. Osseous metastases involving the L2 and T12 vertebral bodies as above, of uncertain origin. No associated pathologic fracture or extra osseous extension of tumor. 2. Extensive bulky retroperitoneal adenopathy, concerning for nodal metastases. Further evaluation with dedicated cross-sectional imaging recommended. 3. Multifactorial degenerative changes at L4-5 with resultant severe spinal stenosis. Electronically Signed   By: Jeannine Boga M.D.   On: 10/21/2019 21:13    Procedures Procedures (including critical care time)  Medications Ordered in ED Medications  LORazepam (ATIVAN) injection 1 mg (1 mg Intramuscular Given 10/21/19 2038)  sodium chloride 0.9 % bolus 1,000 mL (0 mLs Intravenous Stopped 10/21/19 2144)  ondansetron (ZOFRAN) injection 4 mg (4 mg Intravenous Given 10/21/19 2111)  fentaNYL (SUBLIMAZE) injection 50 mcg (50 mcg Intravenous Given 10/21/19 2111)    ED Course  I have reviewed the triage vital signs and the nursing notes.  Pertinent labs & imaging results that were available during my care of the patient were reviewed by me and considered in my medical decision making (see chart for details).  86 year old male patient otherwise well presents for evaluation of back pain.  Subjective fevers at home.  He has no flank pain.  No urinary symptoms.  Symptoms lower lumbar and sacral.  Occasionally has numbness to his right lower extremity however none currently.  No IV drug use, bowel or bladder incontinence, saddle paresthesia.  Given he  admits to fevers at home and back pain will obtain MRI.  Pain medicine, IV fluids and labs  Patient reassessed.  Pain controlled, sleeping soundly.  CBC without leukocytosis Metabolic panel with mild hyponatremia to 132 however no additional chronic, renal abnormality Urinalysis negative for infection, specifically no blood. Low suspicion for infected stone. EKG with A. fib, he has chronic A. fib is anticoagulated.  No RVR. MR lumbar with metastatic lesions.  Discussed with patient.  States his pain is controlled.  He refers to follow-up outpatient for oncology work-up.  Will DC home with symptomatic management.  Low suspicion for acute neurosurgical emergency.  He denies any chest pain, shortness of breath or cough.  I low suspicion for ACS, PE, dissection.  She does not appear septic or ill.  He is tolerating p.o. intake without difficulty.  Ambulatory without difficulty.  Consulted with Dr. Delton Coombes with oncology.  He is aware of patient he can follow-up in office outpatient.  The patient has been appropriately medically screened and/or stabilized in the ED. I have low suspicion for any other emergent medical condition which would require further screening, evaluation or treatment in the ED or require inpatient management.  Patient is hemodynamically stable and in no acute distress.  Patient able to ambulate in department prior to ED.  Evaluation does not show acute pathology that would require ongoing or additional emergent interventions while in the emergency department or further inpatient treatment.  I have discussed the diagnosis with the patient and answered all questions.  Pain is been managed while in the emergency department and patient has no further complaints prior to discharge.  Patient is comfortable with plan discussed in room and is stable for discharge at this time.  I have discussed strict return precautions for returning to the emergency department.  Patient was encouraged to  follow-up with PCP/specialist refer to at discharge.  Patient seen evaluated by attending physician, Dr. Roderic Palau who agrees with above treatment, plan and disposition.  MDM Rules/Calculators/A&P                       Final Clinical Impression(s) / ED Diagnoses Final diagnoses:  Acute midline low back pain with right-sided sciatica  Metastatic malignant neoplasm, unspecified site [facility])    Rx / DC Orders ED Discharge Orders         Ordered    HYDROcodone-acetaminophen (NORCO/VICODIN) 5-325 MG tablet  Every 4 hours PRN     10/21/19 2208    ondansetron (ZOFRAN ODT) 4 MG disintegrating tablet  Every 8 hours PRN     10/21/19 2208    lidocaine (LIDODERM) 5 %  Every 24 hours     10/21/19 2208           Wilhemenia Camba A, PA-C 10/21/19 2226    Milton Ferguson, MD 10/26/19 [phone]

## 2019-10-26 ENCOUNTER — Encounter (HOSPITAL_COMMUNITY): Payer: Self-pay | Admitting: *Deleted

## 2019-10-26 ENCOUNTER — Other Ambulatory Visit: Payer: Self-pay

## 2019-10-26 NOTE — Progress Notes (Signed)
Oncology Navigator Note:  Patient was referred to our office from the ER.  I called patient today to introduce myself and provide information in how I will be involved with their care.  He was not available but his son who is the patient's healthcare power of attorney answered.  I provided information on his first visit and what to expect.  I made sure patient was aware of appointment time and directions to the cancer center.  My phone number was given so that he can call me with any questions or concerns.  He voices appreciation and understanding.

## 2019-10-27 ENCOUNTER — Encounter (HOSPITAL_COMMUNITY): Payer: Self-pay | Admitting: *Deleted

## 2019-10-27 ENCOUNTER — Other Ambulatory Visit: Payer: Self-pay

## 2019-10-28 ENCOUNTER — Encounter (HOSPITAL_COMMUNITY): Payer: Self-pay | Admitting: Hematology

## 2019-10-28 ENCOUNTER — Inpatient Hospital Stay (HOSPITAL_COMMUNITY): Payer: Medicare Other

## 2019-10-28 ENCOUNTER — Inpatient Hospital Stay (HOSPITAL_COMMUNITY): Payer: Medicare Other | Attending: Hematology | Admitting: Hematology

## 2019-10-28 VITALS — BP 123/56 | HR 83 | Temp 97.5°F | Resp 18 | Ht 71.0 in | Wt 187.0 lb

## 2019-10-28 DIAGNOSIS — C7951 Secondary malignant neoplasm of bone: Secondary | ICD-10-CM | POA: Insufficient documentation

## 2019-10-28 DIAGNOSIS — Z87891 Personal history of nicotine dependence: Secondary | ICD-10-CM | POA: Diagnosis not present

## 2019-10-28 DIAGNOSIS — M545 Low back pain: Secondary | ICD-10-CM

## 2019-10-28 DIAGNOSIS — C772 Secondary and unspecified malignant neoplasm of intra-abdominal lymph nodes: Secondary | ICD-10-CM

## 2019-10-28 DIAGNOSIS — R634 Abnormal weight loss: Secondary | ICD-10-CM | POA: Insufficient documentation

## 2019-10-28 DIAGNOSIS — Z8616 Personal history of COVID-19: Secondary | ICD-10-CM | POA: Diagnosis not present

## 2019-10-28 DIAGNOSIS — I10 Essential (primary) hypertension: Secondary | ICD-10-CM

## 2019-10-28 DIAGNOSIS — C801 Malignant (primary) neoplasm, unspecified: Secondary | ICD-10-CM | POA: Diagnosis present

## 2019-10-28 DIAGNOSIS — Z79899 Other long term (current) drug therapy: Secondary | ICD-10-CM | POA: Diagnosis not present

## 2019-10-28 DIAGNOSIS — Z791 Long term (current) use of non-steroidal anti-inflammatories (NSAID): Secondary | ICD-10-CM

## 2019-10-28 DIAGNOSIS — R195 Other fecal abnormalities: Secondary | ICD-10-CM | POA: Insufficient documentation

## 2019-10-28 DIAGNOSIS — I4891 Unspecified atrial fibrillation: Secondary | ICD-10-CM

## 2019-10-28 DIAGNOSIS — Z801 Family history of malignant neoplasm of trachea, bronchus and lung: Secondary | ICD-10-CM | POA: Diagnosis not present

## 2019-10-28 DIAGNOSIS — Z8546 Personal history of malignant neoplasm of prostate: Secondary | ICD-10-CM | POA: Diagnosis not present

## 2019-10-28 DIAGNOSIS — Z85828 Personal history of other malignant neoplasm of skin: Secondary | ICD-10-CM

## 2019-10-28 DIAGNOSIS — R590 Localized enlarged lymph nodes: Secondary | ICD-10-CM | POA: Diagnosis not present

## 2019-10-28 DIAGNOSIS — M48061 Spinal stenosis, lumbar region without neurogenic claudication: Secondary | ICD-10-CM | POA: Diagnosis not present

## 2019-10-28 DIAGNOSIS — Z7901 Long term (current) use of anticoagulants: Secondary | ICD-10-CM

## 2019-10-28 DIAGNOSIS — Z8249 Family history of ischemic heart disease and other diseases of the circulatory system: Secondary | ICD-10-CM

## 2019-10-28 LAB — LACTATE DEHYDROGENASE: LDH: 222 U/L — ABNORMAL HIGH (ref 98–192)

## 2019-10-28 LAB — PSA: Prostatic Specific Antigen: 390 ng/mL — ABNORMAL HIGH (ref 0.00–4.00)

## 2019-10-28 MED ORDER — HYDROCODONE-ACETAMINOPHEN 5-325 MG PO TABS: 2.0000 | ORAL_TABLET | Freq: Every evening | ORAL | 0 refills | Status: DC | PRN

## 2019-10-28 NOTE — Progress Notes (Signed)
[facility] NOTE  Patient Care Team: [name], MD as PCP - General (Family Medicine) Donetta Potts, RN as Oncology Nurse Navigator (Oncology) Derek Jack, MD as Medical Oncologist (Oncology)  CHIEF COMPLAINTS/PURPOSE OF CONSULTATION:  Bone metastasis.  HISTORY OF PRESENTING ILLNESS:  Steven Gutierrez 86 y.o. male is seen in consultation today at the request of emergency department for further work-up and management of bone meta stasis.  He developed severe lower back pain 2 to 3 days prior to presentation to the emergency room.  An MRI of the lumbar spine without contrast showed bone metastasis involving L2 and T12 vertebral bodies.  L2 was completely occupied via meta stasis.  Posterior part of T12 vertebral body along with the pedicle involved.  No pathological fracture.  No extraosseous extension of tumor.  Extensive bulky retroperitoneal adenopathy as incidental finding.  He does report having a TURP done by Dr. Titus Mould in [place].  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  He never had a colonoscopy.  He apparently had a positive Cologuard test and was scheduled for a colonoscopy which was canceled as he developed COVID-19 infection and ended up in the hospital for 6 days in February.  He reported 10 pounds of weight loss since the hospitalization.  No fevers or night sweats was reported.  Currently the pain is well controlled with lidocaine patches 12 hours on 12 hours off.  He is rarely requiring hydrocodone at bedtime.  He worked as a retired Actor.  He was never smoker.  No family history of malignancies reported.  Appetite is 50%.  Energy levels are 25%.  MEDICAL HISTORY:  Past Medical History:  Diagnosis Date  . A-fib ([place])   . Cancer ([place])    skin cancer  . Hypertension     SURGICAL HISTORY: Past Surgical History:  Procedure Laterality  Date  . ESOPHAGOGASTRODUODENOSCOPY  03/2018  . TRANSURETHRAL RESECTION OF PROSTATE  04/21/2019    SOCIAL HISTORY: Social History   Socioeconomic History  . Marital status: Widowed    Spouse name: Not on file  . Number of children: 2  . Years of education: Not on file  . Highest education level: Not on file  Occupational History  . Occupation: Retired  Tobacco Use  . Smoking status: Former Smoker    Packs/day: 0.50    Types: Cigarettes  . Smokeless tobacco: Never Used  Substance and Sexual Activity  . Alcohol use: Yes    Alcohol/week: 1.0 standard drinks    Types: 1 Glasses of wine per week  . Drug use: Never  . Sexual activity: Not Currently  Other Topics Concern  . Not on file  Social History Narrative  . Not on file   Social Determinants of Health   Financial Resource Strain: Low Risk   . Difficulty of Paying Living Expenses: Not hard at all  Food Insecurity: No Food Insecurity  . Worried About Charity fundraiser in the Last Year: Never true  . Ran Out of Food in the Last Year: Never true  Transportation Needs: No Transportation Needs  . Lack of Transportation (Medical): No  . Lack of Transportation (Non-Medical): No  Physical Activity: Insufficiently Active  . Days of Exercise per Week: 3 days  . Minutes of Exercise per Session: 30 min  Stress: No Stress Concern Present  . Feeling of Stress : Not at all  Social Connections: Somewhat Isolated  . Frequency of Communication with Friends and Family: More than three times a week  . Frequency of Social Gatherings with Friends and Family: More than three times a week  . Attends Religious Services: More than 4 times per year  . Active Member of Clubs or Organizations: No  . Attends Archivist Meetings: Never  . Marital Status: Widowed  Intimate Partner Violence: Not At Risk  . Fear of Current or Ex-Partner: No  . Emotionally Abused: No  . Physically Abused: No  . Sexually Abused: No    FAMILY  HISTORY: Family History  Problem Relation Age of Onset  . Heart attack Mother   . Stroke Father   . Heart attack Sister   . Lung cancer Brother     ALLERGIES:  is allergic to oxycodone.  MEDICATIONS:  Current Outpatient Medications  Medication Sig Dispense Refill  . acetaminophen (TYLENOL) 500 MG tablet Take 500-1,000 mg by mouth every 6 (six) hours as needed (for pain.).    Marland Kitchen amLODipine (NORVASC) 10 MG tablet Take 10 mg by mouth daily.    . ferrous sulfate 325 (65 FE) MG tablet Take 1 tablet (325 mg total) by mouth 2 (two) times daily with a meal. 60 tablet 1  . HYDROcodone-acetaminophen (NORCO/VICODIN) 5-325 MG tablet Take 2 tablets by mouth at bedtime as needed. 10 tablet 0  . latanoprost (XALATAN) 0.005 % ophthalmic solution Place 1 drop into both eyes at bedtime.    Marland Kitchen levothyroxine (SYNTHROID) 50 MCG tablet Take 50 mcg by mouth daily before breakfast.    . lidocaine (LIDODERM) 5 % Place 1 patch onto the skin daily. Remove & Discard patch within 12 hours or as directed by MD 30 patch 0  . Multiple Vitamin (MULTIVITAMIN WITH MINERALS) TABS tablet Take 1 tablet by mouth daily.    . naproxen (NAPROSYN) 500 MG tablet Take 500 mg by mouth 2 (two) times daily as needed (pain.).    Marland Kitchen ondansetron (ZOFRAN ODT) 4 MG disintegrating tablet Take 1 tablet (4 mg total) by mouth every 8 (eight) hours as needed for nausea or vomiting. (Patient not taking: Reported on 10/27/2019) 20 tablet 0  . pantoprazole (PROTONIX) 40 MG tablet Take 40 mg by mouth 2 (two) times daily.    . polyethylene glycol (MIRALAX / GLYCOLAX) 17 g packet Take 17 g by mouth daily as needed (constipation.).    Marland Kitchen rivaroxaban (XARELTO) 20 MG TABS tablet Take 20 mg by mouth every evening.     No current facility-administered medications for this visit.    REVIEW OF SYSTEMS:   Constitutional: Denies fevers, chills or abnormal night sweats Eyes: Denies blurriness of vision, double vision or watery eyes Ears, nose, mouth, throat,  and face: Denies mucositis or sore throat Respiratory: Denies cough, dyspnea or wheezes Cardiovascular: Denies palpitation, chest discomfort or lower extremity swelling Gastrointestinal: Positive for constipation alternating with diarrhea. Skin: Denies abnormal skin rashes Lymphatics: Denies new lymphadenopathy or easy bruising Neurological:Denies numbness, tingling or new weaknesses Behavioral/Psych: Mood is stable, no new changes Positive for low back pain, 5 out of 10. All other systems were reviewed with the patient and are negative.  PHYSICAL EXAMINATION: ECOG PERFORMANCE STATUS: 1 - Symptomatic but completely ambulatory  Vitals:   10/28/19 1341  BP: (!) 123/56  Pulse: 83  Resp: 18  Temp: (!) 97.5 F (36.4 C)  SpO2: 99%   Filed Weights   10/28/19 1341  Weight: 187 lb (84.8 kg)  GENERAL:alert, no distress and comfortable SKIN: skin color, texture, turgor are normal, no rashes or significant lesions EYES: normal, conjunctiva are pink and non-injected, sclera clear OROPHARYNX:no exudate, no erythema and lips, buccal mucosa, and tongue normal  NECK: supple, thyroid normal size, non-tender, without nodularity LYMPH:  no palpable lymphadenopathy in the cervical, axillary or inguinal LUNGS: clear to auscultation and percussion with normal breathing effort HEART: regular rate & rhythm and no murmurs.  Trace edema bilaterally. ABDOMEN:abdomen soft, non-tender and normal bowel sounds Musculoskeletal:no cyanosis of digits and no clubbing  PSYCH: alert & oriented x 3 with fluent speech NEURO: no focal motor/sensory deficits  LABORATORY DATA:  I have reviewed the data as listed Lab Results  Component Value Date   WBC 11.0 (H) 10/21/2019   HGB 10.5 (L) 10/21/2019   HCT 35.0 (L) 10/21/2019   MCV 81.4 10/21/2019   PLT 381 10/21/2019     Chemistry      Component Value Date/Time   NA 132 (L) 10/21/2019 2034   K 3.7 10/21/2019 2034   CL 97 (L) 10/21/2019 2034   CO2 23  10/21/2019 2034   BUN 19 10/21/2019 2034   CREATININE 1.01 10/21/2019 2034      Component Value Date/Time   CALCIUM 8.7 (L) 10/21/2019 2034   ALKPHOS 131 (H) 09/06/2019 0430   AST 61 (H) 09/06/2019 0430   ALT 50 (H) 09/06/2019 0430   BILITOT 0.8 09/06/2019 0430       RADIOGRAPHIC STUDIES: I have personally reviewed the radiological images as listed and agreed with the findings in the report. MR LUMBAR SPINE WO CONTRAST  Result Date: 10/21/2019 CLINICAL DATA:  Initial evaluation for acute mid to lower back pain. EXAM: MRI LUMBAR SPINE WITHOUT CONTRAST TECHNIQUE: Multiplanar, multisequence MR imaging of the lumbar spine was performed. No intravenous contrast was administered. COMPARISON:  None available. FINDINGS: Segmentation:  Examination degraded by motion artifact. Standard segmentation. Lowest well-formed disc space labeled the L5-S1 level. Alignment: 3 mm anterolisthesis of L4 on L5, with trace 2 mm anterolisthesis of L5 on S1. Findings are chronic and facet mediated. Vertebrae: Abnormal T1 hypointensity the, stir hyperintense signal intensity seen replacing the L2 vertebral body, consistent with osseous metastasis. No associated pathologic fracture. Additional metastatic lesion seen involving the left posterior aspect of T12 with extension into the left pedicle (series 5, image 13). No other discrete osseous lesions. Underlying bone marrow signal intensity within normal limits. No other abnormal marrow edema. Conus medullaris and cauda equina: Conus extends to the L1 level. Conus and cauda equina appear normal. No epidural tumor or mass. Paraspinal and other soft tissues: Extensive bulky retroperitoneal adenopathy partially visualized, concerning for nodal metastases. Associated scattered soft tissue stranding with mild edema within the adjacent anteromedial aspects of both psoas muscles. Right kidney is atrophic with a few scattered cysts present, largest of which measures 3.5 cm. Disc  levels: T11-12: Disc desiccation with minimal disc bulge. Mild facet hypertrophy. No significant stenosis. T12-L1: Disc desiccation with minimal disc bulge. No stenosis. L1-2: Disc desiccation without disc bulge. Mild facet hypertrophy. No stenosis. L2-3: Disc desiccation with minimal disc bulge. Mild facet hypertrophy. No stenosis. L3-4: Disc desiccation with mild disc bulge. Mild facet hypertrophy. No stenosis. L4-5: Anterolisthesis. Mild diffuse disc bulge with disc desiccation. Superimposed endplate Schmorl's node deformity. Moderate facet and ligament flavum hypertrophy. Resultant severe spinal stenosis. Mild right greater than left L4 foraminal narrowing. L5-S1: Trace anterolisthesis. Mild diffuse disc bulge with disc desiccation. Moderate facet hypertrophy. Resultant mild bilateral  lateral recess stenosis. Mild bilateral L5 foraminal narrowing. IMPRESSION: 1. Osseous metastases involving the L2 and T12 vertebral bodies as above, of uncertain origin. No associated pathologic fracture or extra osseous extension of tumor. 2. Extensive bulky retroperitoneal adenopathy, concerning for nodal metastases. Further evaluation with dedicated cross-sectional imaging recommended. 3. Multifactorial degenerative changes at L4-5 with resultant severe spinal stenosis. Electronically Signed   By: Jeannine Boga M.D.   On: 10/21/2019 21:13    ASSESSMENT & PLAN:  Bone metastasis ([place]) 1.  Bone metastasis: -Presentation with the low back pain 2 to 3 days prior to the ER visit on 10/21/2019. -MRI of the lumbar spine without contrast showed bone metastasis involving L2 and T12 vertebral bodies with no evidence of pathological fracture.  No extraosseous extension of tumor.  Extensive bulky retroperitoneal adenopathy concerning for nodal metastasis. -He reported that he was diagnosed with low-grade prostate cancer when he underwent TURP by Dr. Titus Mould in [place].  He was told to have very low-grade cancer and no  treatment was recommended. -He never had a colonoscopy.  He apparently had Cologuard stool test positive.  Colonoscopy which was scheduled was canceled due to his Covid infection in February.  He does report bleeding hemorrhoids. -Today I have recommended a PSA level and a CEA level.  We will also check an LDH which was previously elevated most likely from COVID-19 infection. -He does not have any fevers or night sweats but lost about 10 pounds since COVID-19 infection. -Differential diagnosis includes prostate primary versus colon cancer versus lymphoma. -I will see him back after the PET CT scan which we have ordered.  2.  Low back pain: -He is using lidocaine patches 12 hours on 12 hours off which is fairly helping with his pain. -He is taking hydrocodone 5/325 at bedtime as needed.  He is not requiring on a daily basis. -I will send a prescription refill for hydrocodone as he ran out of them.  3.  Atrial fibrillation: -He is taking Xarelto without any bleeding issues.  Orders Placed This Encounter  Procedures  . NM PET Image Initial (PI) Skull Base To Thigh    Standing Status:   Future    Standing Expiration Date:   10/27/2020    Order Specific Question:   ** REASON FOR EXAM (FREE TEXT)    Answer:   retroperitoneal lymph nodes    Order Specific Question:   If indicated for the ordered procedure, I authorize the administration of a radiopharmaceutical per Radiology protocol    Answer:   Yes    Order Specific Question:   Preferred imaging location?    Answer:   [facility]    Order Specific Question:   Radiology Contrast Protocol - do NOT remove file path    Answer:   [email or web address]  . CEA    Standing Status:   Future    Number of Occurrences:   1    Standing Expiration Date:   10/27/2020  . Lactate dehydrogenase    Standing Status:   Future    Number of Occurrences:   1    Standing Expiration Date:   10/27/2020  . PSA    Standing Status:   Future     Number of Occurrences:   1    Standing Expiration Date:   10/27/2020    All questions were answered. The patient knows to call the clinic with any problems, questions or concerns.     Derek Jack, MD 10/28/2019 6:01 PM

## 2019-10-28 NOTE — Assessment & Plan Note (Addendum)
1.  Bone metastasis: -Presentation with the low back pain 2 to 3 days prior to the ER visit on 10/21/2019. -MRI of the lumbar spine without contrast showed bone metastasis involving L2 and T12 vertebral bodies with no evidence of pathological fracture.  No extraosseous extension of tumor.  Extensive bulky retroperitoneal adenopathy concerning for nodal metastasis. -He reported that he was diagnosed with low-grade prostate cancer when he underwent TURP by Dr. Titus Mould in [place].  He was told to have very low-grade cancer and no treatment was recommended. -He never had a colonoscopy.  He apparently had Cologuard stool test positive.  Colonoscopy which was scheduled was canceled due to his Covid infection in February.  He does report bleeding hemorrhoids. -Today I have recommended a PSA level and a CEA level.  We will also check an LDH which was previously elevated most likely from COVID-19 infection. -He does not have any fevers or night sweats but lost about 10 pounds since COVID-19 infection. -Differential diagnosis includes prostate primary versus colon cancer versus lymphoma. -I will see him back after the PET CT scan which we have ordered.  2.  Low back pain: -He is using lidocaine patches 12 hours on 12 hours off which is fairly helping with his pain. -He is taking hydrocodone 5/325 at bedtime as needed.  He is not requiring on a daily basis. -I will send a prescription refill for hydrocodone as he ran out of them.  3.  Atrial fibrillation: -He is taking Xarelto without any bleeding issues.

## 2019-10-28 NOTE — Patient Instructions (Signed)
[place] at [facility] Discharge Instructions  You were seen today by Dr. Delton Coombes. He went over your history, family history and how you have been feeling since your recent ER visit. You will have blood drawn today prior to you leaving the hospital. He will also schedule you for a PET scan to further evaluate the lymph nodes that were seen on your scan in the ER. He will see you back after your scan for follow up.   Thank you for choosing [place] at [facility] to provide your oncology and hematology care.  To afford each patient quality time with our provider, please arrive at least 15 minutes before your scheduled appointment time.   If you have a lab appointment with the [facility] please come in thru the  Main Entrance and check in at the main information desk  You need to re-schedule your appointment should you arrive 10 or more minutes late.  We strive to give you quality time with our providers, and arriving late affects you and other patients whose appointments are after yours.  Also, if you no show three or more times for appointments you may be dismissed from the clinic at the providers discretion.     Again, thank you for choosing [facility].  Our hope is that these requests will decrease the amount of time that you wait before being seen by our physicians.       _____________________________________________________________  Should you have questions after your visit to [facility], please contact our office at [phone] between the hours of 8:00 a.m. and 4:30 p.m.  Voicemails left after 4:00 p.m. will not be returned until the following business day.  For prescription refill requests, have your pharmacy contact our office and allow 72 hours.    Cancer Center Support Programs:   > Cancer Support Group  2nd Tuesday of the month 1pm-2pm, Journey Room

## 2019-10-29 ENCOUNTER — Encounter (HOSPITAL_COMMUNITY): Payer: Self-pay | Admitting: *Deleted

## 2019-10-29 LAB — CEA: CEA: 4.7 ng/mL (ref 0.0–4.7)

## 2019-10-29 NOTE — Progress Notes (Signed)
I spoke with son, [name], today via telephone. He advised of the address for the urologist:   Dr. Edwin Dada [facility] [place] [address] [phone]  We will request records from them.

## 2019-11-05 ENCOUNTER — Other Ambulatory Visit (HOSPITAL_COMMUNITY): Payer: Self-pay | Admitting: *Deleted

## 2019-11-05 MED ORDER — HYDROCODONE-ACETAMINOPHEN 5-325 MG PO TABS: 2.0000 | ORAL_TABLET | Freq: Every evening | ORAL | 0 refills | Status: DC | PRN

## 2019-11-09 ENCOUNTER — Other Ambulatory Visit (HOSPITAL_COMMUNITY): Payer: Medicare Other

## 2019-11-10 ENCOUNTER — Other Ambulatory Visit: Payer: Self-pay

## 2019-11-10 ENCOUNTER — Ambulatory Visit
Admission: RE | Admit: 2019-11-10 | Discharge: 2019-11-10 | Disposition: A | Discharge status: Home / Self Care | Payer: Medicare Other | Source: Ambulatory Visit | Attending: Hematology | Admitting: Hematology

## 2019-11-10 DIAGNOSIS — C7951 Secondary malignant neoplasm of bone: Secondary | ICD-10-CM | POA: Insufficient documentation

## 2019-11-10 DIAGNOSIS — Z8616 Personal history of COVID-19: Secondary | ICD-10-CM | POA: Insufficient documentation

## 2019-11-10 DIAGNOSIS — C772 Secondary and unspecified malignant neoplasm of intra-abdominal lymph nodes: Secondary | ICD-10-CM

## 2019-11-10 DIAGNOSIS — R590 Localized enlarged lymph nodes: Secondary | ICD-10-CM | POA: Insufficient documentation

## 2019-11-10 DIAGNOSIS — C61 Malignant neoplasm of prostate: Secondary | ICD-10-CM | POA: Diagnosis not present

## 2019-11-10 DIAGNOSIS — Z8546 Personal history of malignant neoplasm of prostate: Secondary | ICD-10-CM | POA: Insufficient documentation

## 2019-11-10 LAB — GLUCOSE, CAPILLARY: Glucose-Capillary: 90 mg/dL (ref 70–99)

## 2019-11-10 MED ORDER — FLUDEOXYGLUCOSE F - 18 (FDG) INJECTION
9.7000 | Freq: Once | INTRAVENOUS | Status: AC | PRN
  Administered 2019-11-10: 10.24 via INTRAVENOUS

## 2019-11-12 ENCOUNTER — Encounter (HOSPITAL_COMMUNITY): Payer: Self-pay | Admitting: Hematology

## 2019-11-12 ENCOUNTER — Inpatient Hospital Stay (HOSPITAL_COMMUNITY): Payer: Medicare Other | Attending: Hematology | Admitting: Hematology

## 2019-11-12 ENCOUNTER — Other Ambulatory Visit: Payer: Self-pay

## 2019-11-12 ENCOUNTER — Encounter (HOSPITAL_COMMUNITY): Payer: Self-pay | Admitting: Radiology

## 2019-11-12 VITALS — BP 124/67 | HR 74 | Temp 96.9°F | Resp 18 | Wt 186.4 lb

## 2019-11-12 DIAGNOSIS — Z79899 Other long term (current) drug therapy: Secondary | ICD-10-CM | POA: Insufficient documentation

## 2019-11-12 DIAGNOSIS — Z85828 Personal history of other malignant neoplasm of skin: Secondary | ICD-10-CM | POA: Diagnosis not present

## 2019-11-12 DIAGNOSIS — M545 Low back pain: Secondary | ICD-10-CM | POA: Diagnosis not present

## 2019-11-12 DIAGNOSIS — Z7901 Long term (current) use of anticoagulants: Secondary | ICD-10-CM | POA: Diagnosis not present

## 2019-11-12 DIAGNOSIS — C61 Malignant neoplasm of prostate: Secondary | ICD-10-CM | POA: Diagnosis not present

## 2019-11-12 DIAGNOSIS — Z8249 Family history of ischemic heart disease and other diseases of the circulatory system: Secondary | ICD-10-CM | POA: Diagnosis not present

## 2019-11-12 DIAGNOSIS — Z87891 Personal history of nicotine dependence: Secondary | ICD-10-CM | POA: Insufficient documentation

## 2019-11-12 DIAGNOSIS — C772 Secondary and unspecified malignant neoplasm of intra-abdominal lymph nodes: Secondary | ICD-10-CM | POA: Insufficient documentation

## 2019-11-12 DIAGNOSIS — I4891 Unspecified atrial fibrillation: Secondary | ICD-10-CM | POA: Diagnosis not present

## 2019-11-12 DIAGNOSIS — Z791 Long term (current) use of non-steroidal anti-inflammatories (NSAID): Secondary | ICD-10-CM | POA: Diagnosis not present

## 2019-11-12 DIAGNOSIS — Z801 Family history of malignant neoplasm of trachea, bronchus and lung: Secondary | ICD-10-CM | POA: Diagnosis not present

## 2019-11-12 DIAGNOSIS — I10 Essential (primary) hypertension: Secondary | ICD-10-CM | POA: Diagnosis not present

## 2019-11-12 DIAGNOSIS — C7951 Secondary malignant neoplasm of bone: Secondary | ICD-10-CM | POA: Insufficient documentation

## 2019-11-12 DIAGNOSIS — C801 Malignant (primary) neoplasm, unspecified: Secondary | ICD-10-CM | POA: Diagnosis present

## 2019-11-12 NOTE — Progress Notes (Signed)
[place], [place] [address]   CLINIC:  Medical Oncology/Hematology  PCP:  [name], MD [address] [place] [address] [phone]   REASON FOR VISIT:  Follow-up for metastatic cancer to the bones and lymph nodes.  CURRENT THERAPY: Under work-up.   INTERVAL HISTORY:  Steven Gutierrez 86 y.o. male seen for follow-up of metastatic cancer to the bones and lymph nodes.  At last visit we have ordered a PET scan.  Appetite is 100%.  Energy levels are 75%.  He is accompanied by his son today.  He has occasional constipation.  He reports that he is not requiring pain medication hydrocodone on a regular basis.  He was actually mowing his lawn on a riding mower.  Had lost about 1 pound since last visit.    REVIEW OF SYSTEMS:  Review of Systems  Gastrointestinal: Positive for constipation.  All other systems reviewed and are negative.    PAST MEDICAL/SURGICAL HISTORY:  Past Medical History:  Diagnosis Date  . A-fib ([place])   . Cancer ([place])    skin cancer  . Hypertension    Past Surgical History:  Procedure Laterality Date  . ESOPHAGOGASTRODUODENOSCOPY  03/2018  . TRANSURETHRAL RESECTION OF PROSTATE  04/21/2019     SOCIAL HISTORY:  Social History   Socioeconomic History  . Marital status: Widowed    Spouse name: Not on file  . Number of children: 2  . Years of education: Not on file  . Highest education level: Not on file  Occupational History  . Occupation: Retired  Tobacco Use  . Smoking status: Former Smoker    Packs/day: 0.50    Types: Cigarettes  . Smokeless tobacco: Never Used  Substance and Sexual Activity  . Alcohol use: Yes    Alcohol/week: 1.0 standard drinks    Types: 1 Glasses of wine per week  . Drug use: Never  . Sexual activity: Not Currently  Other Topics Concern  . Not on file  Social History Narrative  . Not on file   Social Determinants of Health   Financial Resource Strain: Low Risk   .  Difficulty of Paying Living Expenses: Not hard at all  Food Insecurity: No Food Insecurity  . Worried About Charity fundraiser in the Last Year: Never true  . Ran Out of Food in the Last Year: Never true  Transportation Needs: No Transportation Needs  . Lack of Transportation (Medical): No  . Lack of Transportation (Non-Medical): No  Physical Activity: Insufficiently Active  . Days of Exercise per Week: 3 days  . Minutes of Exercise per Session: 30 min  Stress: No Stress Concern Present  . Feeling of Stress : Not at all  Social Connections: Somewhat Isolated  . Frequency of Communication with Friends and Family: More than three times a week  . Frequency of Social Gatherings with Friends and Family: More than three times a week  . Attends Religious Services: More than 4 times per year  . Active Member of Clubs or Organizations: No  . Attends Archivist Meetings: Never  . Marital Status: Widowed  Intimate Partner Violence: Not At Risk  . Fear of Current or Ex-Partner: No  . Emotionally Abused: No  . Physically Abused: No  . Sexually Abused: No    FAMILY HISTORY:  Family History  Problem Relation Age of Onset  . Heart attack Mother   . Stroke Father   . Heart attack Sister   .  Lung cancer Brother     CURRENT MEDICATIONS:  Outpatient Encounter Medications as of 11/12/2019  Medication Sig  . acetaminophen (TYLENOL) 500 MG tablet Take 500-1,000 mg by mouth every 6 (six) hours as needed (for pain.).  Marland Kitchen amLODipine (NORVASC) 10 MG tablet Take 10 mg by mouth daily.  . ferrous sulfate 325 (65 FE) MG tablet Take 1 tablet (325 mg total) by mouth 2 (two) times daily with a meal.  . HYDROcodone-acetaminophen (NORCO/VICODIN) 5-325 MG tablet Take 2 tablets by mouth at bedtime as needed.  . latanoprost (XALATAN) 0.005 % ophthalmic solution Place 1 drop into both eyes at bedtime.  Marland Kitchen levothyroxine (SYNTHROID) 50 MCG tablet Take 50 mcg by mouth daily before breakfast.  . lidocaine  (LIDODERM) 5 % Place 1 patch onto the skin daily. Remove & Discard patch within 12 hours or as directed by MD  . Multiple Vitamin (MULTIVITAMIN WITH MINERALS) TABS tablet Take 1 tablet by mouth daily.  . naproxen (NAPROSYN) 500 MG tablet Take 500 mg by mouth 2 (two) times daily as needed (pain.).  Marland Kitchen ondansetron (ZOFRAN ODT) 4 MG disintegrating tablet Take 1 tablet (4 mg total) by mouth every 8 (eight) hours as needed for nausea or vomiting.  . pantoprazole (PROTONIX) 40 MG tablet Take 40 mg by mouth 2 (two) times daily.  . polyethylene glycol (MIRALAX / GLYCOLAX) 17 g packet Take 17 g by mouth daily as needed (constipation.).  Marland Kitchen rivaroxaban (XARELTO) 20 MG TABS tablet Take 20 mg by mouth every evening.   No facility-administered encounter medications on file as of 11/12/2019.    ALLERGIES:  Allergies  Allergen Reactions  . Oxycodone Nausea And Vomiting     PHYSICAL EXAM:  ECOG Performance status: 1  Vitals:   11/12/19 0802  BP: 124/67  Pulse: 74  Resp: 18  Temp: (!) 96.9 F (36.1 C)  SpO2: 99%   Filed Weights   11/12/19 0802  Weight: 186 lb 6.4 oz (84.6 kg)    Physical Exam Vitals reviewed.  Constitutional:      Appearance: Normal appearance.  Cardiovascular:     Rate and Rhythm: Normal rate and regular rhythm.     Heart sounds: Normal heart sounds.  Pulmonary:     Effort: Pulmonary effort is normal.     Breath sounds: Normal breath sounds.  Abdominal:     General: There is no distension.     Palpations: Abdomen is soft. There is no mass.  Skin:    General: Skin is warm.  Neurological:     General: No focal deficit present.     Mental Status: He is alert and oriented to person, place, and time.  Psychiatric:        Mood and Affect: Mood normal.        Behavior: Behavior normal.      LABORATORY DATA:  I have reviewed the labs as listed.  CBC    Component Value Date/Time   WBC 11.0 (H) 10/21/2019 2034   RBC 4.30 10/21/2019 2034   HGB 10.5 (L) 10/21/2019  2034   HCT 35.0 (L) 10/21/2019 2034   PLT 381 10/21/2019 2034   MCV 81.4 10/21/2019 2034   MCH 24.4 (L) 10/21/2019 2034   MCHC 30.0 10/21/2019 2034   RDW 19.0 (H) 10/21/2019 2034   LYMPHSABS 2.1 10/21/2019 2034   MONOABS 1.6 (H) 10/21/2019 2034   EOSABS 0.0 10/21/2019 2034   BASOSABS 0.0 10/21/2019 2034   CMP Latest Ref Rng & Units 10/21/2019 09/06/2019 09/05/2019  Glucose 70 - 99 mg/dL 106(H) 141(H) 134(H)  BUN 8 - 23 mg/dL 19 41(H) 38(H)  Creatinine 0.61 - 1.24 mg/dL 1.01 0.95 1.03  Sodium 135 - 145 mmol/L 132(L) 137 137  Potassium 3.5 - 5.1 mmol/L 3.7 4.1 4.1  Chloride 98 - 111 mmol/L 97(L) 101 102  CO2 22 - 32 mmol/L 23 26 25   Calcium 8.9 - 10.3 mg/dL 8.7(L) 9.0 8.8(L)  Total Protein 6.5 - 8.1 g/dL - 7.1 7.1  Total Bilirubin 0.3 - 1.2 mg/dL - 0.8 0.7  Alkaline Phos 38 - 126 U/L - 131(H) 120  AST 15 - 41 U/L - 61(H) 79(H)  ALT 0 - 44 U/L - 50(H) 52(H)       DIAGNOSTIC IMAGING:  I have independently reviewed the scans and discussed with the patient.    ASSESSMENT & PLAN:   Bone metastasis ([place]) 1.  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  -MRI of the lumbar spine showed bone metastasis involving L2, T12 vertebral bodies.  No extraosseous tumor.  Extensive bulky retroperitoneal adenopathy. -Reportedly had TURP done by Dr. Titus Mould in [place] around September 2020 and was told to have low-grade prostate cancer. -PSA was 390.  CEA was 4.7.  LDH was 222. -We reviewed results of PET scan dated 11/10/2019 which showed nodal metastasis involving chest, abdomen and pelvis.  Bone metastasis including a destructive soft tissue mass involving the right posterior column of the acetabulum. -I have discussed and reviewed the images with the patient and his son.  Most likely diagnosis is prostate cancer. -I have recommended biopsy of lymph node.  We will refer to IR. -I have also recommended genetic  testing for germline mutations. -We will follow up after the biopsy.  2.  Low back pain: -He is using lidocaine patches.  He was taking hydrocodone 5/325 on a regular basis. -For the last few days, he is not requiring hydrocodone.  3.  Atrial fibrillation: -He is taking Xarelto without any bleeding problems.  Xarelto can be held the day before the biopsy.  4.  Bone metastasis: -We also talked about starting him on bone protecting agents like denosumab.  He goes to the dentist every 6 months.  He does not have any dental problems.      Orders placed this encounter:  Orders Placed This Encounter  Procedures  . CT Biopsy   Total time spent is 30 minutes with more than 50% of the time spent face-to-face discussing and reviewing scan images, results, further plan, counseling and coordination of care.   Derek Jack, MD [place] [phone]

## 2019-11-12 NOTE — Progress Notes (Signed)
Steven Gutierrez Male, 86 y.o., Dec 07, 1932 MRN:  [id number] Phone:  [phone] (H) PCP:  [name], MD Primary Cvg:  Medicare/Medicare Part A And B Next Appt With Radiology ([facility]-CT 3) 11/18/2019 at 11:00 AM  RE: CT Biopsy Received: Today Message Contents  Derek Jack, MD  Garth Bigness D  OK to hold prior to biopsy.       Previous Messages   ----- Message -----  From: Garth Bigness D  Sent: 11/12/2019 10:39 AM EDT  To: Derek Jack, MD  Subject: FW: CT Biopsy                   Patient is on Xarelto, need an okay to hold for 1 day prior to biopsy before getting patient scheduled.  Thanks Aniceto Boss  ----- Message -----  From: Arne Cleveland, MD  Sent: 11/12/2019  9:58 AM EDT  To: Jillyn Hidden  Subject: RE: CT Biopsy                   Ok   CT core pelvic ST mass   DDH    ----- Message -----  From: Garth Bigness D  Sent: 11/12/2019  8:59 AM EDT  To: Ir Procedure Requests  Subject: CT Biopsy                     Procedure:  CT Biopsy   Reason: Prostate cancer metastatic to bone, metastatic prostate cancer, Please send for [facility]   History:  NM PET, MR in computer   Provider:  Derek Jack   Provider Contact: [phone]

## 2019-11-12 NOTE — Progress Notes (Signed)
Steven Gutierrez Male, 86 y.o., [birth year] MRN:  [id number] Phone:  [phone] (H) PCP:  [name], MD Primary Cvg:  Medicare/Medicare Part A And B Next Appt With Oncology 12/10/2019 at 2:00 PM  RE: CT Biopsy Received: Today Message Contents  Arne Cleveland, MD  Arlyn Leak   CT core pelvic ST mass   DDH       Previous Messages   ----- Message -----  From: Garth Bigness D  Sent: 11/12/2019  8:59 AM EDT  To: Ir Procedure Requests  Subject: CT Biopsy                     Procedure:  CT Biopsy   Reason: Prostate cancer metastatic to bone, metastatic prostate cancer, Please send for [facility]   History:  NM PET, MR in computer   Provider:  Derek Jack   Provider Contact: [phone]

## 2019-11-12 NOTE — Assessment & Plan Note (Addendum)
1.  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  -MRI of the lumbar spine showed bone metastasis involving L2, T12 vertebral bodies.  No extraosseous tumor.  Extensive bulky retroperitoneal adenopathy. -Reportedly had TURP done by Dr. Titus Mould in [place] around September 2020 and was told to have low-grade prostate cancer. -PSA was 390.  CEA was 4.7.  LDH was 222. -We reviewed results of PET scan dated 11/10/2019 which showed nodal metastasis involving chest, abdomen and pelvis.  Bone metastasis including a destructive soft tissue mass involving the right posterior column of the acetabulum. -I have discussed and reviewed the images with the patient and his son.  Most likely diagnosis is prostate cancer. -I have recommended biopsy of lymph node.  We will refer to IR. -I have also recommended genetic testing for germline mutations. -We will follow up after the biopsy.  2.  Low back pain: -He is using lidocaine patches.  He was taking hydrocodone 5/325 on a regular basis. -For the last few days, he is not requiring hydrocodone.  3.  Atrial fibrillation: -He is taking Xarelto without any bleeding problems.  Xarelto can be held the day before the biopsy.  4.  Bone metastasis: -We also talked about starting him on bone protecting agents like denosumab.  He goes to the dentist every 6 months.  He does not have any dental problems.

## 2019-11-12 NOTE — Patient Instructions (Signed)
[place] at [facility] Discharge Instructions  You were seen today by Dr. Delton Coombes. He went over your recent lab and scan results. He will schedule you for a biopsy of the lymph node in your left neck area. Your prostate count is very elevated and Dr. Delton Coombes suspects that you have prostate cancer that has spread to your bones and lymph nodes. He discussed your prognosis and possible treatments you may receive. He will also schedule you for genetic testing as well. He will see you back after your biopsy for follow up.   Thank you for choosing [place] at [facility] to provide your oncology and hematology care.  To afford each patient quality time with our provider, please arrive at least 15 minutes before your scheduled appointment time.   If you have a lab appointment with the [facility] please come in thru the  Main Entrance and check in at the main information desk  You need to re-schedule your appointment should you arrive 10 or more minutes late.  We strive to give you quality time with our providers, and arriving late affects you and other patients whose appointments are after yours.  Also, if you no show three or more times for appointments you may be dismissed from the clinic at the providers discretion.     Again, thank you for choosing [facility]  Our hope is that these requests will decrease the amount of time that you wait before being seen by our physicians.       _____________________________________________________________  Should you have questions after your visit to [facility], please contact our office at [phone] between the hours of 8:00 a.m. and 4:30 p.m.  Voicemails left after 4:00 p.m. will not be returned until the following business day.  For prescription refill requests, have your pharmacy contact our office and allow 72 hours.    Cancer Center Support Programs:   > Cancer  Support Group  2nd Tuesday of the month 1pm-2pm, Journey Room

## 2019-11-17 ENCOUNTER — Other Ambulatory Visit: Payer: Self-pay | Admitting: Radiology

## 2019-11-17 ENCOUNTER — Emergency Department (HOSPITAL_COMMUNITY): Payer: Medicare Other

## 2019-11-17 ENCOUNTER — Observation Stay (HOSPITAL_COMMUNITY)
Admission: EM | Admit: 2019-11-17 | Discharge: 2019-11-18 | Disposition: A | Discharge status: Home / Self Care | Payer: Medicare Other | Attending: Internal Medicine | Admitting: Internal Medicine

## 2019-11-17 ENCOUNTER — Other Ambulatory Visit: Payer: Self-pay

## 2019-11-17 ENCOUNTER — Encounter (HOSPITAL_COMMUNITY): Payer: Self-pay

## 2019-11-17 DIAGNOSIS — I4891 Unspecified atrial fibrillation: Secondary | ICD-10-CM | POA: Diagnosis not present

## 2019-11-17 DIAGNOSIS — C7951 Secondary malignant neoplasm of bone: Secondary | ICD-10-CM | POA: Diagnosis present

## 2019-11-17 DIAGNOSIS — A419 Sepsis, unspecified organism: Secondary | ICD-10-CM

## 2019-11-17 DIAGNOSIS — H409 Unspecified glaucoma: Secondary | ICD-10-CM | POA: Diagnosis not present

## 2019-11-17 DIAGNOSIS — R6510 Systemic inflammatory response syndrome (SIRS) of non-infectious origin without acute organ dysfunction: Secondary | ICD-10-CM

## 2019-11-17 DIAGNOSIS — M109 Gout, unspecified: Secondary | ICD-10-CM | POA: Diagnosis present

## 2019-11-17 DIAGNOSIS — E039 Hypothyroidism, unspecified: Secondary | ICD-10-CM | POA: Diagnosis not present

## 2019-11-17 DIAGNOSIS — R5383 Other fatigue: Secondary | ICD-10-CM | POA: Diagnosis not present

## 2019-11-17 DIAGNOSIS — I10 Essential (primary) hypertension: Secondary | ICD-10-CM | POA: Insufficient documentation

## 2019-11-17 DIAGNOSIS — Z20822 Contact with and (suspected) exposure to covid-19: Secondary | ICD-10-CM | POA: Insufficient documentation

## 2019-11-17 DIAGNOSIS — Z8616 Personal history of COVID-19: Secondary | ICD-10-CM | POA: Diagnosis not present

## 2019-11-17 DIAGNOSIS — Z85828 Personal history of other malignant neoplasm of skin: Secondary | ICD-10-CM | POA: Diagnosis not present

## 2019-11-17 DIAGNOSIS — Z87891 Personal history of nicotine dependence: Secondary | ICD-10-CM | POA: Diagnosis not present

## 2019-11-17 HISTORY — DX: Unspecified glaucoma: H40.9

## 2019-11-17 HISTORY — DX: Gout, unspecified: M10.9

## 2019-11-17 HISTORY — DX: Hypothyroidism, unspecified: E03.9

## 2019-11-17 LAB — CBC WITH DIFFERENTIAL/PLATELET
Abs Immature Granulocytes: 0.13 10*3/uL — ABNORMAL HIGH (ref 0.00–0.07)
Basophils Absolute: 0 10*3/uL (ref 0.0–0.1)
Basophils Relative: 0 %
Eosinophils Absolute: 0.1 10*3/uL (ref 0.0–0.5)
Eosinophils Relative: 1 %
HCT: 34.3 % — ABNORMAL LOW (ref 39.0–52.0)
Hemoglobin: 10.3 g/dL — ABNORMAL LOW (ref 13.0–17.0)
Immature Granulocytes: 1 %
Lymphocytes Relative: 13 %
Lymphs Abs: 1.8 10*3/uL (ref 0.7–4.0)
MCH: 24.3 pg — ABNORMAL LOW (ref 26.0–34.0)
MCHC: 30 g/dL (ref 30.0–36.0)
MCV: 80.9 fL (ref 80.0–100.0)
Monocytes Absolute: 1.2 10*3/uL — ABNORMAL HIGH (ref 0.1–1.0)
Monocytes Relative: 9 %
Neutro Abs: 10.5 10*3/uL — ABNORMAL HIGH (ref 1.7–7.7)
Neutrophils Relative %: 76 %
Platelets: 331 10*3/uL (ref 150–400)
RBC: 4.24 MIL/uL (ref 4.22–5.81)
RDW: 18.6 % — ABNORMAL HIGH (ref 11.5–15.5)
WBC: 13.7 10*3/uL — ABNORMAL HIGH (ref 4.0–10.5)
nRBC: 0 % (ref 0.0–0.2)

## 2019-11-17 LAB — LACTIC ACID, PLASMA: Lactic Acid, Venous: 1.8 mmol/L (ref 0.5–1.9)

## 2019-11-17 LAB — URINALYSIS, ROUTINE W REFLEX MICROSCOPIC
Bacteria, UA: NONE SEEN
Bilirubin Urine: NEGATIVE
Glucose, UA: NEGATIVE mg/dL
Hgb urine dipstick: NEGATIVE
Ketones, ur: NEGATIVE mg/dL
Leukocytes,Ua: NEGATIVE
Nitrite: NEGATIVE
Protein, ur: 30 mg/dL — AB
Specific Gravity, Urine: 1.024 (ref 1.005–1.030)
pH: 5 (ref 5.0–8.0)

## 2019-11-17 LAB — COMPREHENSIVE METABOLIC PANEL
ALT: 20 U/L (ref 0–44)
AST: 31 U/L (ref 15–41)
Albumin: 3.7 g/dL (ref 3.5–5.0)
Alkaline Phosphatase: 174 U/L — ABNORMAL HIGH (ref 38–126)
Anion gap: 12 (ref 5–15)
BUN: 19 mg/dL (ref 8–23)
CO2: 23 mmol/L (ref 22–32)
Calcium: 8.7 mg/dL — ABNORMAL LOW (ref 8.9–10.3)
Chloride: 97 mmol/L — ABNORMAL LOW (ref 98–111)
Creatinine, Ser: 0.88 mg/dL (ref 0.61–1.24)
GFR calc Af Amer: >60 mL/min (ref >60)
GFR calc non Af Amer: >60 mL/min (ref >60)
Glucose, Bld: 119 mg/dL — ABNORMAL HIGH (ref 70–99)
Potassium: 3.9 mmol/L (ref 3.5–5.1)
Sodium: 132 mmol/L — ABNORMAL LOW (ref 135–145)
Total Bilirubin: 0.6 mg/dL (ref 0.3–1.2)
Total Protein: 7.7 g/dL (ref 6.5–8.1)

## 2019-11-17 LAB — PROTIME-INR
INR: 1.5 — ABNORMAL HIGH (ref 0.8–1.2)
Prothrombin Time: 18 seconds — ABNORMAL HIGH (ref 11.4–15.2)

## 2019-11-17 LAB — APTT: aPTT: 55 seconds — ABNORMAL HIGH (ref 24–36)

## 2019-11-17 LAB — TSH: TSH: 5.136 u[IU]/mL — ABNORMAL HIGH (ref 0.350–4.500)

## 2019-11-17 MED ORDER — ACETAMINOPHEN 650 MG RE SUPP: 650.0000 mg | Freq: Four times a day (QID) | RECTAL | Status: DC | PRN

## 2019-11-17 MED ORDER — SODIUM CHLORIDE 0.9 % IV SOLN
2.0000 g | Freq: Once | INTRAVENOUS | Status: AC
  Administered 2019-11-17: 2 g via INTRAVENOUS
  Filled 2019-11-17: qty 20

## 2019-11-17 MED ORDER — ONDANSETRON HCL 4 MG/2ML IJ SOLN: 4.0000 mg | Freq: Four times a day (QID) | INTRAMUSCULAR | Status: DC | PRN

## 2019-11-17 MED ORDER — ACETAMINOPHEN 325 MG PO TABS: 650.0000 mg | ORAL_TABLET | Freq: Four times a day (QID) | ORAL | Status: DC | PRN

## 2019-11-17 MED ORDER — ONDANSETRON HCL 4 MG PO TABS: 4.0000 mg | ORAL_TABLET | Freq: Four times a day (QID) | ORAL | Status: DC | PRN

## 2019-11-17 MED ORDER — IOHEXOL 300 MG/ML  SOLN
100.0000 mL | Freq: Once | INTRAMUSCULAR | Status: AC | PRN
  Administered 2019-11-17: 100 mL via INTRAVENOUS

## 2019-11-17 MED ORDER — SODIUM CHLORIDE 0.9 % IV SOLN: 1.0000 g | Freq: Once | INTRAVENOUS | Status: DC

## 2019-11-17 MED ORDER — ACETAMINOPHEN 325 MG PO TABS
650.0000 mg | ORAL_TABLET | Freq: Once | ORAL | Status: AC
  Administered 2019-11-17: 650 mg via ORAL
  Filled 2019-11-17: qty 2

## 2019-11-17 NOTE — H&P (Signed)
History and Physical    Steven Gutierrez XBJ:[id number] DOB: 09-Oct-1932 DOA: 11/17/2019  PCP: [name], MD   Patient coming from: Home.   I have personally briefly reviewed patient's old medical records in [place]  Chief Complaint: Fever and fatigue.  HPI: Steven Gutierrez is a 86 y.o. male with medical history significant of atrial fibrillation, skin cancer, hypertension, gout, glaucoma, hypothyroidism, Covid pneumonia in early February of this year who is coming to the emergency department due to fever for the past few days associated with severe fatigue.    He also mentioned he was discharged from [facility] and was getting PT at home for a month.  He says he was doing better.  However a few weeks ago he started having back pain, saw Dr. Delton Coombes in the cancer center.  A PET scan was done a week ago showing osseous metastasis, nodal metastasis involving the chest, abdomen and pelvis.  His fatigue has gotten to the point, that he stated that going from his room to the living room is a days work for him.  He denies headache, rhinorrhea, sore throat, but states sometimes he has a dry cough.  No wheezing or hemoptysis.  Denies chest pain, palpitations, dizziness, diaphoresis, PND, orthopnea or pitting edema of the lower extremities.  No abdominal pain, nausea, emesis, diarrhea, constipation, melena or hematochezia.  No dysuria, frequency or hematuria.  Denies polyuria, polydipsia, polyphagia or blurred vision.  ED Course: Initial vital signs were 98.1 F, but subsequently rectal temperature was 100.7 F, pulse 84, respirations 20, blood pressure 140/63 mmHg and O2 sat 99% on room air.  The patient was given a gram of Rocephin and oral acetaminophen 650 mg.  Urinalysis shows proteinuria 30 mg/dL, but is otherwise normal.  His CBC shows a white count of 13.7 with 76% neutrophils, hemoglobin 10.3 g/dL and platelets of 331.  PT is 18.0, INR 1.5 and APTT 55.  Lactic acid was normal.  CMP  shows a sodium 132 and chloride 97 mmol/L.  The rest of the electrolytes are within normal range when calcium is corrected to albumin.  Nonfasting glucose level was 119 mg/dL.  Renal function was normal.  LFTs are normal, except for an alk phosphatase of 174 units/L.  TSH was 5.136.  Imaging: CT abdomen and pelvis shows cardiomegaly without acute airspace disease at the lung bases.  Trace left pleural effusion.  Bulky metastatic adenopathy within the abdomen and pelvis.  Multiple nodes which appear to have increased in size and now are suspicious for necrosis.  Increase inflammatory changes about enlarged lymph nodes.  New mild left hydronephrosis and hydroureter.  No stones seen.  Changes appear to be due to soft tissue inflammatory changes in the mid ureter in the region of left retroperitoneal adenopathy.  There is thick-walled urinary bladder with inflammatory change, possible cystitis.  His PET scan from a week ago showed nodal metastasis involving the chest, abdomen and pelvis.  There were also osseous metastasis, including a destructive soft tissue mass involving the right posterior column of the acetabulum.  Please see images and full radiology report for further details.  Review of Systems: As per HPI otherwise all other systems reviewed and are negative.  Past Medical History:  Diagnosis Date  . A-fib ([place])   . Cancer ([place])    skin cancer  . Hypertension     Past Surgical History:  Procedure Laterality Date  . ESOPHAGOGASTRODUODENOSCOPY  03/2018  . TRANSURETHRAL RESECTION OF PROSTATE  04/21/2019  Social History  reports that he has quit smoking. His smoking use included cigarettes. He smoked 0.50 packs per day. He has never used smokeless tobacco. He reports current alcohol use of about 1.0 standard drinks of alcohol per week. He reports that he does not use drugs.  Allergies  Allergen Reactions  . Oxycodone Nausea And Vomiting    Family History  Problem Relation Age of Onset   . Heart attack Mother   . Stroke Father   . Heart attack Sister   . Lung cancer Brother    Prior to Admission medications   Medication Sig Start Date End Date Taking? Authorizing Provider  acetaminophen (TYLENOL) 500 MG tablet Take 1,000 mg by mouth daily as needed for fever (for pain.).    Yes [provider]  allopurinol (ZYLOPRIM) 300 MG tablet Take 300 mg by mouth daily.   Yes [provider]  amLODipine (NORVASC) 10 MG tablet Take 10 mg by mouth daily.   Yes [provider]  docusate sodium (PHILLIPS STOOL SOFTENER) 100 MG capsule Take 100 mg by mouth daily as needed for mild constipation.   Yes [provider]  HYDROcodone-acetaminophen (NORCO/VICODIN) 5-325 MG tablet Take 2 tablets by mouth at bedtime as needed. Patient taking differently: Take 2 tablets by mouth at bedtime as needed for moderate pain.  11/05/19  Yes Derek Jack, MD  levothyroxine (SYNTHROID) 50 MCG tablet Take 50 mcg by mouth daily before breakfast.   Yes [provider]  lidocaine (LIDODERM) 5 % Place 1 patch onto the skin daily. Remove & Discard patch within 12 hours or as directed by MD Patient taking differently: Place 1 patch onto the skin daily as needed (pain). Remove & Discard patch within 12 hours or as directed by MD 10/21/19  Yes Henderly, Britni A, PA-C  Multiple Vitamin (MULTIVITAMIN WITH MINERALS) TABS tablet Take 1 tablet by mouth daily.   Yes [provider]  ondansetron (ZOFRAN ODT) 4 MG disintegrating tablet Take 1 tablet (4 mg total) by mouth every 8 (eight) hours as needed for nausea or vomiting. 10/21/19  Yes Henderly, Britni A, PA-C  pantoprazole (PROTONIX) 40 MG tablet Take 40 mg by mouth daily.    Yes [provider]  rivaroxaban (XARELTO) 20 MG TABS tablet Take 20 mg by mouth in the morning.    Yes [provider]  timolol (BETIMOL) 0.5 % ophthalmic solution Place 1 drop into the left eye in the morning.   Yes [provider]  ferrous sulfate 325 (65 FE) MG tablet Take 1 tablet (325 mg total) by mouth 2 (two) times daily with a meal. Patient not taking: Reported on 11/17/2019 09/07/19   Damita Lack, MD    Physical Exam: Vitals:   11/17/19 1640 11/17/19 1830 11/17/19 1900 11/17/19 1930  BP:  108/61 117/71 112/65  Pulse:  72 76 74  Resp:  18 (!) 23 (!) 24  Temp: (!) 100.7 F (38.2 C)     TempSrc: Rectal     SpO2:  96% 97% 95%  Weight:      Height:        Constitutional: Mildly febrile, but in NAD.  Looks chronically ill. Eyes: PERRL, lids and conjunctivae are mildly injected. ENMT: Mucous membranes are moist. Posterior pharynx clear of any exudate or lesions. Neck: normal, supple, no masses, no thyromegaly Respiratory: clear to auscultation bilaterally, no wheezing, no crackles. Normal respiratory effort. No accessory muscle use.  Cardiovascular: Irregularly irregular, no murmurs / rubs /  gallops. No extremity edema. 2+ pedal pulses. No carotid bruits.  Abdomen: Nondistended.  BS positive.  Soft, no tenderness, no masses palpated. No hepatosplenomegaly. Musculoskeletal: no clubbing / cyanosis. Good ROM, no contractures. Normal muscle tone.  Skin: Some small areas of ecchymosis on extremities. Neurologic: CN 2-12 grossly intact. Sensation intact, DTR normal. Strength 5/5 in all 4.  Psychiatric: Normal judgment and insight. Alert and oriented x 3. Normal mood.   Labs on Admission: I have personally reviewed following labs and imaging studies  CBC: Recent Labs  Lab 11/17/19 1604  WBC 13.7*  NEUTROABS 10.5*  HGB 10.3*  HCT 34.3*  MCV 80.9  PLT 353    Basic Metabolic Panel: Recent Labs  Lab 11/17/19 1604  NA 132*  K 3.9  CL 97*  CO2 23  GLUCOSE 119*  BUN 19  CREATININE 0.88  CALCIUM 8.7*    GFR: Estimated Creatinine Clearance: 68.6 mL/min (by C-G formula based on SCr of 0.88 mg/dL).  Liver Function Tests: Recent Labs  Lab 11/17/19 1604  AST 31  ALT 20    ALKPHOS 174*  BILITOT 0.6  PROT 7.7  ALBUMIN 3.7    Urine analysis:    Component Value Date/Time   COLORURINE YELLOW 11/17/2019 1730   APPEARANCEUR CLEAR 11/17/2019 1730   LABSPEC 1.024 11/17/2019 1730   PHURINE 5.0 11/17/2019 1730   GLUCOSEU NEGATIVE 11/17/2019 1730   HGBUR NEGATIVE 11/17/2019 1730   BILIRUBINUR NEGATIVE 11/17/2019 1730   KETONESUR NEGATIVE 11/17/2019 1730   PROTEINUR 30 (A) 11/17/2019 1730   NITRITE NEGATIVE 11/17/2019 1730   LEUKOCYTESUR NEGATIVE 11/17/2019 1730    Radiological Exams on Admission: CT ABDOMEN PELVIS W CONTRAST  Result Date: 11/17/2019 CLINICAL DATA:  Low-grade fever and fatigue EXAM: CT ABDOMEN AND PELVIS WITH CONTRAST TECHNIQUE: Multidetector CT imaging of the abdomen and pelvis was performed using the standard protocol following bolus administration of intravenous contrast. CONTRAST:  121m OMNIPAQUE IOHEXOL 300 MG/ML  SOLN COMPARISON:  PET CT 11/10/2019, MRI 10/21/2019 FINDINGS: Lower chest: Lung bases demonstrate scattered areas of scarring at the lingula and left base. No acute consolidation. Trace left pleural effusion. Cardiomegaly. Coronary vascular calcification. Hepatobiliary: No calcified gallstone. No focal hepatic abnormality or biliary dilatation Pancreas: Unremarkable. No pancreatic ductal dilatation or surrounding inflammatory changes. Spleen: Accessory splenule. Spleen otherwise normal. Adrenals/Urinary Tract: Adrenal glands are normal. Probable exophytic cyst off the upper pole of the right kidney. Atrophic right kidney with cortical scarring at the mid to upper pole. New mild left hydronephrosis and hydroureter without definitive obstructing stone. Soft tissue inflammatory changes about the mid ureter anterior to left retroperitoneal adenopathy. Thick-walled urinary bladder with soft tissue stranding. Stomach/Bowel: The stomach is nonenlarged. No dilated small bowel. No colon wall thickening. Diverticular disease of the colon without  acute inflammatory change. Vascular/Lymphatic: Extensive aortic atherosclerosis. No aneurysmal dilatation. Bulky retroperitoneal adenopathy which appears increased. 3.4 cm right para-aortic node, series 2, image number 32, compared with 2.3 cm previously. 4.4 cm enlarged left common iliac node, series 2, image number 51, previously 3.4 cm. Now demonstrates internal low attenuation consistent with necrosis. Surrounding inflammatory process. Left pelvic sidewall node measures 3.5 cm, series 2, image number 68, previously 3 cm. This also demonstrates internal low attenuation consistent with necrosis. Increased soft tissue inflammatory changes. Enlarged left inguinal lymph nodes, also increased. Reproductive: Postprocedure changes of the prostate Other: No free air. Mild presacral soft tissue stranding and edema. Slight increased fluid and soft tissue thickening at the pararenal spaces. Musculoskeletal: 3.4 cm soft  tissue mass with involvement of the right posterior acetabulum, previously 3.3 cm. Diffuse sclerosis of L2. Known T12 metastatic pedicle lesion is better seen on MRI. IMPRESSION: 1. Cardiomegaly without acute airspace disease at the lung bases. Trace left pleural effusion. 2. Bulky metastatic adenopathy within the abdomen and pelvis as described above. Multiple nodes appear increased in size and now demonstrate internal low density suggesting necrosis. Increased inflammatory changes about the enlarged lymph nodes. 3. New mild left hydronephrosis and hydroureter, suspect that source of obstruction is related to the soft tissue inflammatory changes at the mid ureter in the region of left retroperitoneal adenopathy. 4. Thick-walled urinary bladder with inflammatory change, possible cystitis 5. Slight increased fluid and soft tissue thickening in the pararenal spaces and presacral space. 6. Grossly stable skeletal metastatic disease. Aortic Atherosclerosis (ICD10-I70.0). Electronically Signed   By: Donavan Foil  M.D.   On: 11/17/2019 18:51   EKG: Independently reviewed. Vent. rate 83 BPM PR interval * ms QRS duration 105 ms QT/QTc 381/448 ms P-R-T axes * 54 27 Atrial fibrillation  Assessment/Plan Principal Problem:   SIRS (systemic inflammatory response syndrome) (HCC) Suspect fever might be neoplastic in nature. Observation/telemetry. Acetaminophen as needed. Gentle/time-limited IV hydration. Will defer further antibiotics for now. Follow-up blood cultures. Follow-up CBC and CMP.  Active Problems:   Bone metastasis (HCC) Analgesics as needed. He is scheduled for lymph node biopsy tomorrow. Follow-up with Dr. Delton Coombes scheduled.    Unspecified atrial fibrillation (HCC) CHA?DS?-VASc Score of at least 4 Xarelto has been held for lymph node biopsy.    Essential hypertension Continue amlodipine 10 mg p.o. daily. Monitor blood pressure.    Glaucoma Continue timolol drops.    Gout Continue allopurinol 300 mg p.o. daily.    Hypothyroidism Continue levothyroxine Check TSH.   DVT prophylaxis: On Xarelto.  Code Status:   Full code. Family Communication: Disposition Plan:   Patient is from:  Home.  Anticipated DC to:  Home.  Anticipated DC date:  11/18/2019 or 11/19/2019.  Anticipated DC barriers: Clinical improvement.  Consults called:   Admission status:  Observation/telemetry.  Severity of Illness:  Moderate severity.   Reubin Milan MD [facility]  How to contact the [facility] Attending or Consulting provider [place] or covering provider during after hours [place], for this patient?   1. Check the care team in [facility] and look for a) attending/consulting [facility] provider listed and b) the [facility] team listed 2. Log into [email or web address] and use [place]'s universal password to access. If you do not have the password, please contact the hospital operator. 3. Locate the [facility] provider you are looking for under [facility] and page to a number that you can be directly  reached. 4. If you still have difficulty reaching the provider, please page the [facility] (Director on Call) for the Hospitalists listed on amion for assistance.  11/17/2019, 8:13 PM   This document was prepared using Dragon voice recognition software and may contain some unintended transcription errors.

## 2019-11-17 NOTE — ED Provider Notes (Signed)
[address] [facility] EMERGENCY DEPARTMENT Provider Note   CSN: [id number] Arrival date & time: 11/17/19  1532     History Chief Complaint  Patient presents with  . Fever    Steven Gutierrez is a 86 y.o. male.  HPI   This patient is an 86 year old male, history of recent diagnosis of likely prostate cancer with possible metastatic disease to the spine, history of hypertension atrial fibrillation and a recent diagnosis of coronavirus 2019 pneumonia for which she was admitted to the hospital for approximately 1 week.  Since that time he has been out of the hospital and doing well, continues to have persistent shortness of breath but is very well tolerated however within the last couple of weeks he has developed some progressive fatigue significant lethargy as the day goes on and is now having fevers as high as 101.8 yesterday.  He feels chills when this occurs, he does not have any increasing cough nor does he have any change in his urinary symptoms though he has noticed increased urinary frequency ever since a month ago.  He does have a history of a transurethral resection of the prostate in the past.  He is scheduled to have a biopsy of his prostate tomorrow to further classify the prostate cancer prior to beginning potential treatment.  He has already been seen by Dr. Delton Coombes of the oncology service.  I have personally reviewed the medical record to obtain the above information.  The PET scan that was performed on November 10, 2019 showed that the patient had nodal metastases involving the chest abdomen and pelvis and osseous metastases including a right posterior column of the acetabulum.  The patient had an MRI to evaluate for his mid to lower back pain on October 21, 2019.  This showed osseous metastases involving L2 and T12 vertebral bodies of uncertain origin.  Past Medical History:  Diagnosis Date  . A-fib ([place])   . Cancer ([place])    skin cancer  . Hypertension     Patient Active Problem List     Diagnosis Date Noted  . Bone metastasis ([place]) 10/28/2019  . Pneumonia due to COVID-19 virus 09/01/2019  . Unspecified atrial fibrillation ([place]) 09/01/2019  . Essential hypertension 09/01/2019    Past Surgical History:  Procedure Laterality Date  . ESOPHAGOGASTRODUODENOSCOPY  03/2018  . TRANSURETHRAL RESECTION OF PROSTATE  04/21/2019       Family History  Problem Relation Age of Onset  . Heart attack Mother   . Stroke Father   . Heart attack Sister   . Lung cancer Brother     Social History   Tobacco Use  . Smoking status: Former Smoker    Packs/day: 0.50    Types: Cigarettes  . Smokeless tobacco: Never Used  Substance Use Topics  . Alcohol use: Yes    Alcohol/week: 1.0 standard drinks    Types: 1 Glasses of wine per week    Comment: occ  . Drug use: Never    Home Medications Prior to Admission medications   Medication Sig Start Date End Date Taking? Authorizing Provider  acetaminophen (TYLENOL) 500 MG tablet Take 1,000 mg by mouth daily as needed for fever (for pain.).    Yes [provider]  allopurinol (ZYLOPRIM) 300 MG tablet Take 300 mg by mouth daily.   Yes [provider]  amLODipine (NORVASC) 10 MG tablet Take 10 mg by mouth daily.   Yes [provider]  docusate sodium (PHILLIPS STOOL SOFTENER) 100 MG capsule Take 100  mg by mouth daily as needed for mild constipation.   Yes [provider]  HYDROcodone-acetaminophen (NORCO/VICODIN) 5-325 MG tablet Take 2 tablets by mouth at bedtime as needed. Patient taking differently: Take 2 tablets by mouth at bedtime as needed for moderate pain.  11/05/19  Yes Derek [place], MD  levothyroxine (SYNTHROID) 50 MCG tablet Take 50 mcg by mouth daily before breakfast.   Yes [provider]  lidocaine (LIDODERM) 5 % Place 1 patch onto the skin daily. Remove & Discard patch within 12 hours or as directed by MD Patient taking differently: Place 1 patch onto the skin daily as  needed (pain). Remove & Discard patch within 12 hours or as directed by MD 10/21/19  Yes Henderly, Britni A, PA-C  Multiple Vitamin (MULTIVITAMIN WITH MINERALS) TABS tablet Take 1 tablet by mouth daily.   Yes [provider]  ondansetron (ZOFRAN ODT) 4 MG disintegrating tablet Take 1 tablet (4 mg total) by mouth every 8 (eight) hours as needed for nausea or vomiting. 10/21/19  Yes Henderly, Britni A, PA-C  pantoprazole (PROTONIX) 40 MG tablet Take 40 mg by mouth daily.    Yes [provider]  rivaroxaban (XARELTO) 20 MG TABS tablet Take 20 mg by mouth in the morning.    Yes [provider]  timolol (BETIMOL) 0.5 % ophthalmic solution Place 1 drop into the left eye in the morning.   Yes [provider]  ferrous sulfate 325 (65 FE) MG tablet Take 1 tablet (325 mg total) by mouth 2 (two) times daily with a meal. Patient not taking: Reported on 11/17/2019 09/07/19   Damita Lack, MD    Allergies    Oxycodone  Review of Systems   Review of Systems  All other systems reviewed and are negative.   Physical Exam Updated Vital Signs BP 117/71   Pulse 76   Temp (!) 100.7 F (38.2 C) (Rectal)   Resp (!) 23   Ht 1.829 m (6')   Wt 84.8 kg   SpO2 97%   BMI 25.36 kg/m   Physical Exam Vitals and nursing note reviewed.  Constitutional:      General: He is not in acute distress.    Appearance: He is well-developed.  HENT:     Head: Normocephalic and atraumatic.     Mouth/Throat:     Pharynx: No oropharyngeal exudate.  Eyes:     General: No scleral icterus.       Right eye: No discharge.        Left eye: No discharge.     Conjunctiva/sclera: Conjunctivae normal.     Pupils: Pupils are equal, round, and reactive to light.  Neck:     Thyroid: No thyromegaly.     Vascular: No JVD.  Cardiovascular:     Rate and Rhythm: Normal rate and regular rhythm.     Heart sounds: Normal heart sounds. No murmur. No friction rub. No gallop.   Pulmonary:     Effort:  Pulmonary effort is normal. No respiratory distress.     Breath sounds: Rales present. No wheezing.     Comments: The patient is in no respiratory distress and speaks in full sentences.  He does have rales at the bases, these do not clear with deep breathing Abdominal:     General: Bowel sounds are normal. There is no distension.     Palpations: Abdomen is soft. There is no mass.     Tenderness: There is abdominal tenderness.  Comments: There is focal tenderness to palpation the left lower quadrant without guarding or peritoneal signs  Musculoskeletal:        General: No tenderness. Normal range of motion.     Cervical back: Normal range of motion and neck supple.  Lymphadenopathy:     Cervical: No cervical adenopathy.  Skin:    General: Skin is warm and dry.     Findings: No erythema or rash.  Neurological:     Mental Status: He is alert.     Coordination: Coordination normal.     Comments: Normal strength in all 4 extremities, normal level of alertness, no facial droop, clear speech  Psychiatric:        Behavior: Behavior normal.     ED Results / Procedures / Treatments   Labs (all labs ordered are listed, but only abnormal results are displayed) Labs Reviewed  COMPREHENSIVE METABOLIC PANEL - Abnormal; Notable for the following components:      Result Value   Sodium 132 (*)    Chloride 97 (*)    Glucose, Bld 119 (*)    Calcium 8.7 (*)    Alkaline Phosphatase 174 (*)    All other components within normal limits  CBC WITH DIFFERENTIAL/PLATELET - Abnormal; Notable for the following components:   WBC 13.7 (*)    Hemoglobin 10.3 (*)    HCT 34.3 (*)    MCH 24.3 (*)    RDW 18.6 (*)    Neutro Abs 10.5 (*)    Monocytes Absolute 1.2 (*)    Abs Immature Granulocytes 0.13 (*)    All other components within normal limits  PROTIME-INR - Abnormal; Notable for the following components:   Prothrombin Time 18.0 (*)    INR 1.5 (*)    All other components within normal limits    URINALYSIS, ROUTINE W REFLEX MICROSCOPIC - Abnormal; Notable for the following components:   Protein, ur 30 (*)    All other components within normal limits  APTT - Abnormal; Notable for the following components:   aPTT 55 (*)    All other components within normal limits  CULTURE, BLOOD (ROUTINE X 2)  CULTURE, BLOOD (ROUTINE X 2)  URINE CULTURE  SARS CORONAVIRUS 2 (TAT 6-24 HRS)  LACTIC ACID, PLASMA  TSH    EKG EKG Interpretation  Date/Time:  Tuesday November 17 2019 15:53:08 EDT Ventricular Rate:  83 PR Interval:    QRS Duration: 105 QT Interval:  381 QTC Calculation: 448 R Axis:   54 Text Interpretation: Atrial fibrillation Since last tracing rate slower Confirmed by Noemi Chapel [phone]) on 11/17/2019 3:56:20 PM   Radiology CT ABDOMEN PELVIS W CONTRAST  Result Date: 11/17/2019 CLINICAL DATA:  Low-grade fever and fatigue EXAM: CT ABDOMEN AND PELVIS WITH CONTRAST TECHNIQUE: Multidetector CT imaging of the abdomen and pelvis was performed using the standard protocol following bolus administration of intravenous contrast. CONTRAST:  185mL OMNIPAQUE IOHEXOL 300 MG/ML  SOLN COMPARISON:  PET CT 11/10/2019, MRI 10/21/2019 FINDINGS: Lower chest: Lung bases demonstrate scattered areas of scarring at the lingula and left base. No acute consolidation. Trace left pleural effusion. Cardiomegaly. Coronary vascular calcification. Hepatobiliary: No calcified gallstone. No focal hepatic abnormality or biliary dilatation Pancreas: Unremarkable. No pancreatic ductal dilatation or surrounding inflammatory changes. Spleen: Accessory splenule. Spleen otherwise normal. Adrenals/Urinary Tract: Adrenal glands are normal. Probable exophytic cyst off the upper pole of the right kidney. Atrophic right kidney with cortical scarring at the mid to upper pole. New mild left hydronephrosis and hydroureter  without definitive obstructing stone. Soft tissue inflammatory changes about the mid ureter anterior to left  retroperitoneal adenopathy. Thick-walled urinary bladder with soft tissue stranding. Stomach/Bowel: The stomach is nonenlarged. No dilated small bowel. No colon wall thickening. Diverticular disease of the colon without acute inflammatory change. Vascular/Lymphatic: Extensive aortic atherosclerosis. No aneurysmal dilatation. Bulky retroperitoneal adenopathy which appears increased. 3.4 cm right para-aortic node, series 2, image number 32, compared with 2.3 cm previously. 4.4 cm enlarged left common iliac node, series 2, image number 51, previously 3.4 cm. Now demonstrates internal low attenuation consistent with necrosis. Surrounding inflammatory process. Left pelvic sidewall node measures 3.5 cm, series 2, image number 68, previously 3 cm. This also demonstrates internal low attenuation consistent with necrosis. Increased soft tissue inflammatory changes. Enlarged left inguinal lymph nodes, also increased. Reproductive: Postprocedure changes of the prostate Other: No free air. Mild presacral soft tissue stranding and edema. Slight increased fluid and soft tissue thickening at the pararenal spaces. Musculoskeletal: 3.4 cm soft tissue mass with involvement of the right posterior acetabulum, previously 3.3 cm. Diffuse sclerosis of L2. Known T12 metastatic pedicle lesion is better seen on MRI. IMPRESSION: 1. Cardiomegaly without acute airspace disease at the lung bases. Trace left pleural effusion. 2. Bulky metastatic adenopathy within the abdomen and pelvis as described above. Multiple nodes appear increased in size and now demonstrate internal low density suggesting necrosis. Increased inflammatory changes about the enlarged lymph nodes. 3. New mild left hydronephrosis and hydroureter, suspect that source of obstruction is related to the soft tissue inflammatory changes at the mid ureter in the region of left retroperitoneal adenopathy. 4. Thick-walled urinary bladder with inflammatory change, possible cystitis 5.  Slight increased fluid and soft tissue thickening in the pararenal spaces and presacral space. 6. Grossly stable skeletal metastatic disease. Aortic Atherosclerosis (ICD10-I70.0). Electronically Signed   By: Donavan Foil M.D.   On: 11/17/2019 18:51    Procedures Procedures (including critical care time)  Medications Ordered in ED Medications  acetaminophen (TYLENOL) tablet 650 mg (650 mg Oral Given 11/17/19 1706)  cefTRIAXone (ROCEPHIN) 2 g in sodium chloride 0.9 % 100 mL IVPB (0 g Intravenous Stopped 11/17/19 1748)  iohexol (OMNIPAQUE) 300 MG/ML solution 100 mL (100 mLs Intravenous Contrast Given 11/17/19 1754)    ED Course  I have reviewed the triage vital signs and the nursing notes.  Pertinent labs & imaging results that were available during my care of the patient were reviewed by me and considered in my medical decision making (see chart for details).    MDM Rules/Calculators/A&P                      The patient does have a tactile fever, he will get a rectal temperature, he is normotensive and does not have a tachycardia.  His white blood cell count is elevated at 13,700 thus with fevers that he is having at home I activated a code sepsis.  We will obtain a lactic acid, chest x-ray, blood cultures, urinalysis, lab work and a 24-hour Covid swab though I would anticipate this is not the source given the patient's diagnosis and treatment within the last 60 days.  Tylenol will be given for the fever, Rocephin, CT scan to rule out intra-abdominal infection.   Lab Tests:   I Ordered, reviewed, and interpreted labs, which included CBC, metabolic panel, cultures, lactic acid   Medicines ordered:   I ordered medication Tylenol and Rocephin for fever and infection  Imaging Studies ordered:  I ordered imaging studies which included chest x-ray two-view and CT scan of the abdomen pelvis and  I independently visualized and interpreted imaging which showed significant increase in  the size of lymphadenopathy, there was some presacral swelling, some hydronephrosis from obstructive pathology  Additional history obtained:   Additional history obtained from medical record  Previous records obtained and reviewed   Consultations Obtained:   I consulted with the hospitalist and discussed lab and imaging findings  Reevaluation:  After the interventions stated above, I reevaluated the patient and found to have fever, still no tachycardia, no obvious source of the infection though he does have some inflammatory appearing changes in the abdomen.  Critical Interventions:  . Rocephin given for possible infection, fever treated, lactic acid negative, discussed with hospitalist for admission.  Cultures pending.  No obvious source at this time.  There is hydronephrosis but no significant renal dysfunction.  Creatinine was 0.88   Franc Levasseur was evaluated in Emergency Department on 11/17/2019 for the symptoms described in the history of present illness. He was evaluated in the context of the global COVID-19 pandemic, which necessitated consideration that the patient might be at risk for infection with the SARS-CoV-2 virus that causes COVID-19. Institutional protocols and algorithms that pertain to the evaluation of patients at risk for COVID-19 are in a state of rapid change based on information released by regulatory bodies including the CDC and federal and state organizations. These policies and algorithms were followed during the patient's care in the ED.    Final Clinical Impression(s) / ED Diagnoses Final diagnoses:  Sepsis, due to unspecified organism, unspecified whether acute organ dysfunction present [facility])    Rx / DC Orders ED Discharge Orders    None       Noemi Chapel, MD 11/17/19 [phone]

## 2019-11-17 NOTE — ED Triage Notes (Signed)
Pt reports had covid in Feb and was admitted to [facility].  Reports had PT once he got home for approx 1 month. Says was feeling better.  Reports started having back pain a few weeks ago and saw Dr. Raliegh Ip in the cancer center.  Reports had a PET scan and was told he had prostate cancer and saw some other places but pt unsure where.  Pt says has had low grade fever for the past few days and extreme fatigue.  Pt says is supposed to have biopsies tomorrow but says unsure what they are going to biopsy.

## 2019-11-18 ENCOUNTER — Ambulatory Visit (HOSPITAL_COMMUNITY)
Admission: RE | Admit: 2019-11-18 | Discharge: 2019-11-18 | Disposition: A | Discharge status: Home / Self Care | Payer: Medicare Other | Source: Ambulatory Visit | Attending: Hematology | Admitting: Hematology

## 2019-11-18 ENCOUNTER — Encounter (HOSPITAL_COMMUNITY): Payer: Self-pay

## 2019-11-18 ENCOUNTER — Other Ambulatory Visit: Payer: Self-pay

## 2019-11-18 DIAGNOSIS — Z823 Family history of stroke: Secondary | ICD-10-CM | POA: Insufficient documentation

## 2019-11-18 DIAGNOSIS — Z885 Allergy status to narcotic agent status: Secondary | ICD-10-CM | POA: Insufficient documentation

## 2019-11-18 DIAGNOSIS — Z87891 Personal history of nicotine dependence: Secondary | ICD-10-CM | POA: Diagnosis not present

## 2019-11-18 DIAGNOSIS — C7951 Secondary malignant neoplasm of bone: Secondary | ICD-10-CM

## 2019-11-18 DIAGNOSIS — E039 Hypothyroidism, unspecified: Secondary | ICD-10-CM | POA: Diagnosis not present

## 2019-11-18 DIAGNOSIS — R590 Localized enlarged lymph nodes: Secondary | ICD-10-CM | POA: Insufficient documentation

## 2019-11-18 DIAGNOSIS — Z79899 Other long term (current) drug therapy: Secondary | ICD-10-CM | POA: Insufficient documentation

## 2019-11-18 DIAGNOSIS — R6510 Systemic inflammatory response syndrome (SIRS) of non-infectious origin without acute organ dysfunction: Secondary | ICD-10-CM | POA: Diagnosis not present

## 2019-11-18 DIAGNOSIS — R599 Enlarged lymph nodes, unspecified: Secondary | ICD-10-CM

## 2019-11-18 DIAGNOSIS — Z79890 Hormone replacement therapy (postmenopausal): Secondary | ICD-10-CM | POA: Insufficient documentation

## 2019-11-18 DIAGNOSIS — C61 Malignant neoplasm of prostate: Secondary | ICD-10-CM

## 2019-11-18 DIAGNOSIS — I10 Essential (primary) hypertension: Secondary | ICD-10-CM | POA: Insufficient documentation

## 2019-11-18 DIAGNOSIS — H409 Unspecified glaucoma: Secondary | ICD-10-CM | POA: Diagnosis not present

## 2019-11-18 DIAGNOSIS — Z8249 Family history of ischemic heart disease and other diseases of the circulatory system: Secondary | ICD-10-CM | POA: Insufficient documentation

## 2019-11-18 DIAGNOSIS — R591 Generalized enlarged lymph nodes: Secondary | ICD-10-CM

## 2019-11-18 DIAGNOSIS — Z7901 Long term (current) use of anticoagulants: Secondary | ICD-10-CM | POA: Insufficient documentation

## 2019-11-18 LAB — CBC
HCT: 29.8 % — ABNORMAL LOW (ref 39.0–52.0)
Hemoglobin: 8.8 g/dL — ABNORMAL LOW (ref 13.0–17.0)
MCH: 23.7 pg — ABNORMAL LOW (ref 26.0–34.0)
MCHC: 29.5 g/dL — ABNORMAL LOW (ref 30.0–36.0)
MCV: 80.3 fL (ref 80.0–100.0)
Platelets: 328 10*3/uL (ref 150–400)
RBC: 3.71 MIL/uL — ABNORMAL LOW (ref 4.22–5.81)
RDW: 18.3 % — ABNORMAL HIGH (ref 11.5–15.5)
WBC: 9.8 10*3/uL (ref 4.0–10.5)
nRBC: 0 % (ref 0.0–0.2)

## 2019-11-18 LAB — BASIC METABOLIC PANEL
Anion gap: 9 (ref 5–15)
BUN: 17 mg/dL (ref 8–23)
CO2: 26 mmol/L (ref 22–32)
Calcium: 8.7 mg/dL — ABNORMAL LOW (ref 8.9–10.3)
Chloride: 101 mmol/L (ref 98–111)
Creatinine, Ser: 0.85 mg/dL (ref 0.61–1.24)
GFR calc Af Amer: >60 mL/min (ref >60)
GFR calc non Af Amer: >60 mL/min (ref >60)
Glucose, Bld: 99 mg/dL (ref 70–99)
Potassium: 4.1 mmol/L (ref 3.5–5.1)
Sodium: 136 mmol/L (ref 135–145)

## 2019-11-18 LAB — T4, FREE: Free T4: 0.98 ng/dL (ref 0.61–1.12)

## 2019-11-18 LAB — PROTIME-INR
INR: 1.4 — ABNORMAL HIGH (ref 0.8–1.2)
Prothrombin Time: 17.2 seconds — ABNORMAL HIGH (ref 11.4–15.2)

## 2019-11-18 LAB — PROCALCITONIN: Procalcitonin: <0.1 ng/mL

## 2019-11-18 LAB — SARS CORONAVIRUS 2 (TAT 6-24 HRS): SARS Coronavirus 2: NEGATIVE

## 2019-11-18 MED ORDER — AMLODIPINE BESYLATE 5 MG PO TABS
10.0000 mg | ORAL_TABLET | Freq: Every day | ORAL | Status: DC
  Filled 2019-11-18: qty 2

## 2019-11-18 MED ORDER — FENTANYL CITRATE (PF) 100 MCG/2ML IJ SOLN
INTRAMUSCULAR | Status: DC | PRN
  Administered 2019-11-18 (×2): 25 ug via INTRAVENOUS

## 2019-11-18 MED ORDER — MIDAZOLAM HCL 2 MG/2ML IJ SOLN
INTRAMUSCULAR | Status: DC | PRN
  Administered 2019-11-18: 1 mg via INTRAVENOUS

## 2019-11-18 MED ORDER — ALLOPURINOL 300 MG PO TABS
300.0000 mg | ORAL_TABLET | Freq: Every day | ORAL | Status: DC
  Filled 2019-11-18: qty 1

## 2019-11-18 MED ORDER — AMLODIPINE BESYLATE 5 MG PO TABS: 5.0000 mg | ORAL_TABLET | Freq: Every day | ORAL | 3 refills | Status: DC

## 2019-11-18 MED ORDER — DOCUSATE SODIUM 100 MG PO CAPS: 100.0000 mg | ORAL_CAPSULE | Freq: Every day | ORAL | Status: DC | PRN

## 2019-11-18 MED ORDER — HYDROCODONE-ACETAMINOPHEN 5-325 MG PO TABS: 2.0000 | ORAL_TABLET | Freq: Every evening | ORAL | Status: DC | PRN

## 2019-11-18 MED ORDER — PANTOPRAZOLE SODIUM 40 MG PO TBEC
40.0000 mg | DELAYED_RELEASE_TABLET | Freq: Every day | ORAL | Status: DC
  Filled 2019-11-18: qty 1

## 2019-11-18 MED ORDER — LEVOTHYROXINE SODIUM 50 MCG PO TABS: 50.0000 ug | ORAL_TABLET | Freq: Every day | ORAL | Status: DC

## 2019-11-18 MED ORDER — FENTANYL CITRATE (PF) 100 MCG/2ML IJ SOLN
INTRAMUSCULAR | Status: AC
  Filled 2019-11-18: qty 4

## 2019-11-18 MED ORDER — TIMOLOL MALEATE 0.5 % OP SOLN: 1.0000 [drp] | Freq: Every morning | OPHTHALMIC | Status: DC

## 2019-11-18 MED ORDER — SODIUM CHLORIDE 0.9 % IV SOLN: INTRAVENOUS | Status: DC

## 2019-11-18 MED ORDER — MIDAZOLAM HCL 2 MG/2ML IJ SOLN
INTRAMUSCULAR | Status: AC
  Filled 2019-11-18: qty 4

## 2019-11-18 NOTE — Sedation Documentation (Signed)
Pt arrived via [facility] from [facility] for biopsy. Pt is awake and alert, oriented x4. NPO, consent signed. Vitals stable

## 2019-11-18 NOTE — Procedures (Signed)
  Procedure: CT core bx L ext iliac LAN   EBL:   minimal Complications:  none immediate  See full dictation in [facility].  Dillard Cannon MD Main # [phone] Pager  [phone]

## 2019-11-18 NOTE — Discharge Summary (Signed)
Physician Discharge Summary  Steven Gutierrez [id number] DOB: June 03, 1933 DOA: 11/17/2019  PCP: [name], MD  Admit date: 11/17/2019  Discharge date: 11/18/2019  Admitted From:Home  Disposition:  Home  Recommendations for Outpatient Follow-up:  1. Follow up with PCP in 1-2 weeks and follow-up CBC and BMP 2. Decrease amlodipine to 5mg  daily instead of 10mg  daily and follow BP in outpatient setting 3. Follow-up with urology Dr. Diona Fanti as requested in 2 weeks, please call office to schedule appointment regarding new findings of left-sided hydronephrosis and hydroureter without noted obstruction or creatinine elevation 4. Follow-up with Dr. Delton Coombes as recommended to obtain review and results on lymph node biopsy 5. Continue other home medications as prior  Home Health: None  Equipment/Devices: None  Discharge Condition: Stable  CODE STATUS: Full  Diet recommendation: Heart Healthy  Brief/Interim Summary: Per HPI: [name] is a 86 y.o. male with medical history significant of atrial fibrillation, skin cancer, hypertension, gout, glaucoma, hypothyroidism, Covid pneumonia in early February of this year who is coming to the emergency department due to fever for the past few days associated with severe fatigue.    He also mentioned he was discharged from [facility] and was getting PT at home for a month.  He says he was doing better.  However a few weeks ago he started having back pain, saw Dr. Delton Coombes in the cancer center.  A PET scan was done a week ago showing osseous metastasis, nodal metastasis involving the chest, abdomen and pelvis.  His fatigue has gotten to the point, that he stated that going from his room to the living room is a days work for him.  He denies headache, rhinorrhea, sore throat, but states sometimes he has a dry cough.  No wheezing or hemoptysis.  Denies chest pain, palpitations, dizziness, diaphoresis, PND, orthopnea or pitting edema of the lower  extremities.  No abdominal pain, nausea, emesis, diarrhea, constipation, melena or hematochezia.  No dysuria, frequency or hematuria.  Denies polyuria, polydipsia, polyphagia or blurred vision.  Patient was admitted with concern for SIRS criteria, but is noted to have extensive tumor burden.  He was complaining of fatigue and is noted to have concern for metastatic prostate cancer with bony metastatic lesions.  He appears to have had some softer blood pressure readings during this inpatient stay and therefore, I have decreased his amlodipine with the hopes that with subsequent blood pressure elevation his fatigue should resolve.  He has had overall poor oral intake and this may likely be contributing as well.  His thyroid studies have been evaluated and T4 is within normal limits.  During the stay, he was transferred to [facility] for CT-guided lymph node biopsy which he tolerated well with no complications.  He will follow up with Dr. Delton Coombes as scheduled.  CT of the abdomen and pelvis also demonstrates some findings of left-sided hydronephrosis and hydroureter for which I recommended follow-up with urology and they have elected to follow-up with Dr. Diona Fanti.  I have instructed him to call the office for follow-up.  No other acute events noted throughout this inpatient stay.  He is stable for discharge.  Discharge Diagnoses:  Principal Problem:   SIRS (systemic inflammatory response syndrome) (HCC) Active Problems:   Unspecified atrial fibrillation (HCC)   Essential hypertension   Bone metastasis (HCC)   Hypothyroidism   Gout   Glaucoma    Discharge Instructions  Discharge Instructions    Diet - low sodium heart healthy   Complete by:  As directed    Increase activity slowly   Complete by: As directed      Allergies as of 11/18/2019      Reactions   Oxycodone Nausea And Vomiting      Medication List    TAKE these medications   acetaminophen 500 MG tablet Commonly known as:  TYLENOL Take 1,000 mg by mouth daily as needed for fever (for pain.).   allopurinol 300 MG tablet Commonly known as: ZYLOPRIM Take 300 mg by mouth daily.   amLODipine 5 MG tablet Commonly known as: NORVASC Take 1 tablet (5 mg total) by mouth daily. What changed:   medication strength  how much to take   ferrous sulfate 325 (65 FE) MG tablet Take 1 tablet (325 mg total) by mouth 2 (two) times daily with a meal.   HYDROcodone-acetaminophen 5-325 MG tablet Commonly known as: NORCO/VICODIN Take 2 tablets by mouth at bedtime as needed. What changed: reasons to take this   levothyroxine 50 MCG tablet Commonly known as: SYNTHROID Take 50 mcg by mouth daily before breakfast.   lidocaine 5 % Commonly known as: Lidoderm Place 1 patch onto the skin daily. Remove & Discard patch within 12 hours or as directed by MD What changed:   when to take this  reasons to take this   multivitamin with minerals Tabs tablet Take 1 tablet by mouth daily.   ondansetron 4 MG disintegrating tablet Commonly known as: Zofran ODT Take 1 tablet (4 mg total) by mouth every 8 (eight) hours as needed for nausea or vomiting.   pantoprazole 40 MG tablet Commonly known as: PROTONIX Take 40 mg by mouth daily.   Phillips Stool Softener 100 MG capsule Generic drug: docusate sodium Take 100 mg by mouth daily as needed for mild constipation.   rivaroxaban 20 MG Tabs tablet Commonly known as: XARELTO Take 20 mg by mouth in the morning.   timolol 0.5 % ophthalmic solution Commonly known as: BETIMOL Place 1 drop into the left eye in the morning.      Follow-up Information    [name], MD Follow up in 1 week(s).   Specialty: Family Medicine Contact information: [address] [place] [address] [phone]        Franchot Gallo, MD. Schedule an appointment as soon as possible for a visit in 2 week(s).   Specialty: Urology Contact information: [address] [place]  [address] [phone]        Derek Jack, MD Follow up in 2 week(s).   Specialty: Hematology Contact information: [place] [address] [phone]          Allergies  Allergen Reactions  . Oxycodone Nausea And Vomiting    Consultations:  IR   Procedures/Studies: DG Chest 2 View  Result Date: 11/17/2019 CLINICAL DATA:  Possible sepsis EXAM: CHEST - 2 VIEW COMPARISON:  09/04/2019 FINDINGS: Diffuse bilateral interstitial and somewhat nodular appearing opacities. No pleural effusion. Cardiomegaly with aortic atherosclerosis. No pneumothorax. IMPRESSION: Probable underlying chronic interstitial disease. Diffuse bilateral interstitial and nodular opacities suspect for acute superimposed infectious or inflammatory process, possible atypical or viral pneumonia. Electronically Signed   By: Donavan Foil M.D.   On: 11/17/2019 18:53   MR LUMBAR SPINE WO CONTRAST  Result Date: 10/21/2019 CLINICAL DATA:  Initial evaluation for acute mid to lower back pain. EXAM: MRI LUMBAR SPINE WITHOUT CONTRAST TECHNIQUE: Multiplanar, multisequence MR imaging of the lumbar spine was performed. No intravenous contrast was administered.  COMPARISON:  None available. FINDINGS: Segmentation:  Examination degraded by motion artifact. Standard segmentation. Lowest well-formed disc space labeled the L5-S1 level. Alignment: 3 mm anterolisthesis of L4 on L5, with trace 2 mm anterolisthesis of L5 on S1. Findings are chronic and facet mediated. Vertebrae: Abnormal T1 hypointensity the, stir hyperintense signal intensity seen replacing the L2 vertebral body, consistent with osseous metastasis. No associated pathologic fracture. Additional metastatic lesion seen involving the left posterior aspect of T12 with extension into the left pedicle (series 5, image 13). No other discrete osseous lesions. Underlying bone marrow signal intensity within normal limits. No other abnormal marrow edema. Conus  medullaris and cauda equina: Conus extends to the L1 level. Conus and cauda equina appear normal. No epidural tumor or mass. Paraspinal and other soft tissues: Extensive bulky retroperitoneal adenopathy partially visualized, concerning for nodal metastases. Associated scattered soft tissue stranding with mild edema within the adjacent anteromedial aspects of both psoas muscles. Right kidney is atrophic with a few scattered cysts present, largest of which measures 3.5 cm. Disc levels: T11-12: Disc desiccation with minimal disc bulge. Mild facet hypertrophy. No significant stenosis. T12-L1: Disc desiccation with minimal disc bulge. No stenosis. L1-2: Disc desiccation without disc bulge. Mild facet hypertrophy. No stenosis. L2-3: Disc desiccation with minimal disc bulge. Mild facet hypertrophy. No stenosis. L3-4: Disc desiccation with mild disc bulge. Mild facet hypertrophy. No stenosis. L4-5: Anterolisthesis. Mild diffuse disc bulge with disc desiccation. Superimposed endplate Schmorl's node deformity. Moderate facet and ligament flavum hypertrophy. Resultant severe spinal stenosis. Mild right greater than left L4 foraminal narrowing. L5-S1: Trace anterolisthesis. Mild diffuse disc bulge with disc desiccation. Moderate facet hypertrophy. Resultant mild bilateral lateral recess stenosis. Mild bilateral L5 foraminal narrowing. IMPRESSION: 1. Osseous metastases involving the L2 and T12 vertebral bodies as above, of uncertain origin. No associated pathologic fracture or extra osseous extension of tumor. 2. Extensive bulky retroperitoneal adenopathy, concerning for nodal metastases. Further evaluation with dedicated cross-sectional imaging recommended. 3. Multifactorial degenerative changes at L4-5 with resultant severe spinal stenosis. Electronically Signed   By: Jeannine Boga M.D.   On: 10/21/2019 21:13   CT ABDOMEN PELVIS W CONTRAST  Result Date: 11/17/2019 CLINICAL DATA:  Low-grade fever and fatigue EXAM: CT  ABDOMEN AND PELVIS WITH CONTRAST TECHNIQUE: Multidetector CT imaging of the abdomen and pelvis was performed using the standard protocol following bolus administration of intravenous contrast. CONTRAST:  131mL OMNIPAQUE IOHEXOL 300 MG/ML  SOLN COMPARISON:  PET CT 11/10/2019, MRI 10/21/2019 FINDINGS: Lower chest: Lung bases demonstrate scattered areas of scarring at the lingula and left base. No acute consolidation. Trace left pleural effusion. Cardiomegaly. Coronary vascular calcification. Hepatobiliary: No calcified gallstone. No focal hepatic abnormality or biliary dilatation Pancreas: Unremarkable. No pancreatic ductal dilatation or surrounding inflammatory changes. Spleen: Accessory splenule. Spleen otherwise normal. Adrenals/Urinary Tract: Adrenal glands are normal. Probable exophytic cyst off the upper pole of the right kidney. Atrophic right kidney with cortical scarring at the mid to upper pole. New mild left hydronephrosis and hydroureter without definitive obstructing stone. Soft tissue inflammatory changes about the mid ureter anterior to left retroperitoneal adenopathy. Thick-walled urinary bladder with soft tissue stranding. Stomach/Bowel: The stomach is nonenlarged. No dilated small bowel. No colon wall thickening. Diverticular disease of the colon without acute inflammatory change. Vascular/Lymphatic: Extensive aortic atherosclerosis. No aneurysmal dilatation. Bulky retroperitoneal adenopathy which appears increased. 3.4 cm right para-aortic node, series 2, image number 32, compared with 2.3 cm previously. 4.4 cm enlarged left common iliac node, series 2, image number 51, previously 3.4  cm. Now demonstrates internal low attenuation consistent with necrosis. Surrounding inflammatory process. Left pelvic sidewall node measures 3.5 cm, series 2, image number 68, previously 3 cm. This also demonstrates internal low attenuation consistent with necrosis. Increased soft tissue inflammatory changes. Enlarged  left inguinal lymph nodes, also increased. Reproductive: Postprocedure changes of the prostate Other: No free air. Mild presacral soft tissue stranding and edema. Slight increased fluid and soft tissue thickening at the pararenal spaces. Musculoskeletal: 3.4 cm soft tissue mass with involvement of the right posterior acetabulum, previously 3.3 cm. Diffuse sclerosis of L2. Known T12 metastatic pedicle lesion is better seen on MRI. IMPRESSION: 1. Cardiomegaly without acute airspace disease at the lung bases. Trace left pleural effusion. 2. Bulky metastatic adenopathy within the abdomen and pelvis as described above. Multiple nodes appear increased in size and now demonstrate internal low density suggesting necrosis. Increased inflammatory changes about the enlarged lymph nodes. 3. New mild left hydronephrosis and hydroureter, suspect that source of obstruction is related to the soft tissue inflammatory changes at the mid ureter in the region of left retroperitoneal adenopathy. 4. Thick-walled urinary bladder with inflammatory change, possible cystitis 5. Slight increased fluid and soft tissue thickening in the pararenal spaces and presacral space. 6. Grossly stable skeletal metastatic disease. Aortic Atherosclerosis (ICD10-I70.0). Electronically Signed   By: Donavan Foil M.D.   On: 11/17/2019 18:51   NM PET Image Initial (PI) Skull Base To Thigh  Result Date: 11/11/2019 CLINICAL DATA:  Initial treatment strategy for prostate cancer with retroperitoneal lymphadenopathy. EXAM: NUCLEAR MEDICINE PET SKULL BASE TO THIGH TECHNIQUE: 10.2 mCi F-18 FDG was injected intravenously. Full-ring PET imaging was performed from the skull base to thigh after the radiotracer. CT data was obtained and used for attenuation correction and anatomic localization. Fasting blood glucose: 90 mg/dl COMPARISON:  MRI lumbar spine dated 10/21/2019 FINDINGS: Mediastinal blood pool activity: SUV max 2.4 Liver activity: SUV max NA NECK: No  hypermetabolic cervical lymphadenopathy. Incidental CT findings: none CHEST: Multifocal patchy pulmonary opacities, favoring chronic interstitial lung disease or post infectious/inflammatory scarring, without associated hypermetabolism. Thoracic lymphadenopathy, including: --2.1 cm short axis node at the left thoracic inlet (series 3/image 69), max SUV 7.3 --1.9 cm short axis lower paraesophageal lymph node adjacent to the subcarinal region (series 3/image 101), max SUV 6.1 ---1.8 cm short axis right retrocrural node (series 3/image 145), max SUV 6.8 Incidental CT findings: Mild cardiomegaly. Atherosclerotic calcifications of the aortic arch. 3 vessel coronary atherosclerosis. ABDOMEN/PELVIS: No abnormal hypermetabolism in the liver, spleen, pancreas, or adrenal glands. Widespread retroperitoneal and pelvic lymphadenopathy, including: --2.3 cm short axis right para-aortic node (series 3/image 168), max SUV 6.2 --3.4 cm short axis left common iliac node (series 3/image 199), max SUV 12.1 --1.7 cm short axis right common iliac node (series 3/image 213), max SUV 10.5 --3.0 cm short axis left pelvic sidewall node (series 3/image 231), max SUV 12.9 --1.2 cm short axis left inferior inguinal node (series 3/image 262), without appreciable hypermetabolism, max SUV 1.4 Postprocedural changes/volume loss of the right prostate (series 3/image 253). Incidental CT findings: Atherosclerotic calcifications the abdominal aorta and branch vessels. Left colonic diverticulosis, without evidence of diverticulitis. Thick-walled bladder. SKELETON: 3.3 cm destructive soft tissue mass involving the right posterior column of the acetabulum (series 3/image 246), max SUV 10.9. Additional multifocal osseous metastases, including --T10 vertebral body, max SUV 3.7 --Left posterior elements at T12, max SUV 3.9 --L2 vertebral body with sclerosis on CT (series 3/image 169), max SUV 6.6 --Left inferior pubic ramus with sclerosis  on CT (series  3/image 263), max SUV 5.2 Incidental CT findings: Degenerative changes of the visualized thoracolumbar spine. IMPRESSION: Nodal metastases involving the chest, abdomen, and pelvis, as described above. Osseous metastases, including a destructive soft tissue mass involving the right posterior column of the acetabulum, as above. Postprocedural changes/volume loss of the right prostate. Additional ancillary findings as above. Electronically Signed   By: Julian Hy M.D.   On: 11/11/2019 07:54    Discharge Exam: Vitals:   11/17/19 2131 11/18/19 0604  BP: (!) 101/59 (!) 99/57  Pulse: 76 83  Resp: 20 16  Temp: 98.9 F (37.2 C) 99.4 F (37.4 C)  SpO2: 98% 99%   Vitals:   11/17/19 1900 11/17/19 1930 11/17/19 2131 11/18/19 0604  BP: 117/71 112/65 (!) 101/59 (!) 99/57  Pulse: 76 74 76 83  Resp: (!) 23 (!) 24 20 16   Temp:   98.9 F (37.2 C) 99.4 F (37.4 C)  TempSrc:   Oral Oral  SpO2: 97% 95% 98% 99%  Weight:      Height:        General: Pt is alert, awake, not in acute distress Cardiovascular: RRR, S1/S2 +, no rubs, no gallops Respiratory: CTA bilaterally, no wheezing, no rhonchi Abdominal: Soft, NT, ND, bowel sounds + Extremities: no edema, no cyanosis    The results of significant diagnostics from this hospitalization (including imaging, microbiology, ancillary and laboratory) are listed below for reference.     Microbiology: Recent Results (from the past 240 hour(s))  Culture, blood (Routine x 2)     Status: None (Preliminary result)   Collection Time: 11/17/19  4:04 PM   Specimen: Left Antecubital; Blood  Result Value Ref Range Status   Specimen Description LEFT ANTECUBITAL  Final   Special Requests   Final    BOTTLES DRAWN AEROBIC AND ANAEROBIC Blood Culture adequate volume   Culture   Final    NO GROWTH < 24 HOURS Performed at [facility], [address], [place], [place] [address]    Report Status PENDING  Incomplete  Culture, blood (Routine x 2)     Status:  None (Preliminary result)   Collection Time: 11/17/19  4:27 PM   Specimen: BLOOD RIGHT FOREARM  Result Value Ref Range Status   Specimen Description BLOOD RIGHT FOREARM  Final   Special Requests   Final    BOTTLES DRAWN AEROBIC AND ANAEROBIC Blood Culture adequate volume   Culture   Final    NO GROWTH < 24 HOURS Performed at [facility], [address], [place], [place] [address]    Report Status PENDING  Incomplete  SARS CORONAVIRUS 2 (TAT 6-24 HRS) Nasopharyngeal Nasopharyngeal Swab     Status: None   Collection Time: 11/17/19  5:40 PM   Specimen: Nasopharyngeal Swab  Result Value Ref Range Status   SARS Coronavirus 2 NEGATIVE NEGATIVE Final    Comment: (NOTE) SARS-CoV-2 target nucleic acids are NOT DETECTED. The SARS-CoV-2 RNA is generally detectable in upper and lower respiratory specimens during the acute phase of infection. Negative results do not preclude SARS-CoV-2 infection, do not rule out co-infections with other pathogens, and should not be used as the sole basis for treatment or other patient management decisions. Negative results must be combined with clinical observations, patient history, and epidemiological information. The expected result is Negative. Fact Sheet for Patients: SugarRoll.be Fact Sheet for Healthcare Providers: https://www.woods-mathews.com/ This test is not yet approved or cleared by the [place] and  has been authorized  for detection and/or diagnosis of SARS-CoV-2 by FDA under an Emergency Use Authorization (EUA). This EUA will remain  in effect (meaning this test can be used) for the duration of the COVID-19 declaration under Section 56 4(b)(1) of the Act, 21 U.S.C. section 360bbb-3(b)(1), unless the authorization is terminated or revoked sooner. Performed at [facility], [place] [address], [place], [place] [address]      Labs: BNP (last 3 results) Recent Labs    09/03/19 0245  BNP  [id number]*   Basic Metabolic Panel: Recent Labs  Lab 11/17/19 1604 11/18/19 0444  NA 132* 136  K 3.9 4.1  CL 97* 101  CO2 23 26  GLUCOSE 119* 99  BUN 19 17  CREATININE 0.88 0.85  CALCIUM 8.7* 8.7*   Liver Function Tests: Recent Labs  Lab 11/17/19 1604  AST 31  ALT 20  ALKPHOS 174*  BILITOT 0.6  PROT 7.7  ALBUMIN 3.7   No results for input(s): LIPASE, AMYLASE in the last 168 hours. No results for input(s): AMMONIA in the last 168 hours. CBC: Recent Labs  Lab 11/17/19 1604 11/18/19 0444  WBC 13.7* 9.8  NEUTROABS 10.5*  --   HGB 10.3* 8.8*  HCT 34.3* 29.8*  MCV 80.9 80.3  PLT 331 328   Cardiac Enzymes: No results for input(s): CKTOTAL, CKMB, CKMBINDEX, TROPONINI in the last 168 hours. BNP: Invalid input(s): POCBNP CBG: No results for input(s): GLUCAP in the last 168 hours. D-Dimer No results for input(s): DDIMER in the last 72 hours. Hgb A1c No results for input(s): HGBA1C in the last 72 hours. Lipid Profile No results for input(s): CHOL, HDL, LDLCALC, TRIG, CHOLHDL, LDLDIRECT in the last 72 hours. Thyroid function studies Recent Labs    11/17/19 1604  TSH 5.136*   Anemia work up No results for input(s): VITAMINB12, FOLATE, FERRITIN, TIBC, IRON, RETICCTPCT in the last 72 hours. Urinalysis    Component Value Date/Time   COLORURINE YELLOW 11/17/2019 1730   APPEARANCEUR CLEAR 11/17/2019 1730   LABSPEC 1.024 11/17/2019 1730   PHURINE 5.0 11/17/2019 1730   GLUCOSEU NEGATIVE 11/17/2019 1730   HGBUR NEGATIVE 11/17/2019 1730   BILIRUBINUR NEGATIVE 11/17/2019 1730   KETONESUR NEGATIVE 11/17/2019 1730   PROTEINUR 30 (A) 11/17/2019 1730   NITRITE NEGATIVE 11/17/2019 1730   LEUKOCYTESUR NEGATIVE 11/17/2019 1730   Sepsis Labs Invalid input(s): PROCALCITONIN,  WBC,  LACTICIDVEN Microbiology Recent Results (from the past 240 hour(s))  Culture, blood (Routine x 2)     Status: None (Preliminary result)   Collection Time: 11/17/19  4:04 PM   Specimen: Left  Antecubital; Blood  Result Value Ref Range Status   Specimen Description LEFT ANTECUBITAL  Final   Special Requests   Final    BOTTLES DRAWN AEROBIC AND ANAEROBIC Blood Culture adequate volume   Culture   Final    NO GROWTH < 24 HOURS Performed at [facility], [address], [place], [place] [address]    Report Status PENDING  Incomplete  Culture, blood (Routine x 2)     Status: None (Preliminary result)   Collection Time: 11/17/19  4:27 PM   Specimen: BLOOD RIGHT FOREARM  Result Value Ref Range Status   Specimen Description BLOOD RIGHT FOREARM  Final   Special Requests   Final    BOTTLES DRAWN AEROBIC AND ANAEROBIC Blood Culture adequate volume   Culture   Final    NO GROWTH < 24 HOURS Performed at [facility], [address] [place], [place] [address]  Report Status PENDING  Incomplete  SARS CORONAVIRUS 2 (TAT 6-24 HRS) Nasopharyngeal Nasopharyngeal Swab     Status: None   Collection Time: 11/17/19  5:40 PM   Specimen: Nasopharyngeal Swab  Result Value Ref Range Status   SARS Coronavirus 2 NEGATIVE NEGATIVE Final    Comment: (NOTE) SARS-CoV-2 target nucleic acids are NOT DETECTED. The SARS-CoV-2 RNA is generally detectable in upper and lower respiratory specimens during the acute phase of infection. Negative results do not preclude SARS-CoV-2 infection, do not rule out co-infections with other pathogens, and should not be used as the sole basis for treatment or other patient management decisions. Negative results must be combined with clinical observations, patient history, and epidemiological information. The expected result is Negative. Fact Sheet for Patients: SugarRoll.be Fact Sheet for Healthcare Providers: https://www.woods-mathews.com/ This test is not yet approved or cleared by the [place] FDA and  has been authorized for detection and/or diagnosis of SARS-CoV-2 by FDA under an Emergency Use Authorization (EUA).  This EUA will remain  in effect (meaning this test can be used) for the duration of the COVID-19 declaration under Section 56 4(b)(1) of the Act, 21 U.S.C. section 360bbb-3(b)(1), unless the authorization is terminated or revoked sooner. Performed at [facility], [place] [address] [place], [place] [address]      Time coordinating discharge: Over 35 minutes  SIGNED:   Rodena Goldmann, DO [facility] 11/18/2019, 3:38 PM  If 7PM-7AM, please contact night-coverage [email or web address]

## 2019-11-18 NOTE — Progress Notes (Signed)
Spoke with [facility] about transportation to patient's biopsy at [facility] tomorrow; they said they would be here around 0800 to pick him up.

## 2019-11-18 NOTE — Progress Notes (Signed)
Nsg Discharge Note  Admit Date:  11/17/2019 Discharge date: 11/18/2019   Kery Corts to be D/C'd Home per MD order.  AVS completed.   Patient/caregiver able to verbalize understanding.  Discharge Medication: Allergies as of 11/18/2019      Reactions   Oxycodone Nausea And Vomiting      Medication List    TAKE these medications   acetaminophen 500 MG tablet Commonly known as: TYLENOL Take 1,000 mg by mouth daily as needed for fever (for pain.).   allopurinol 300 MG tablet Commonly known as: ZYLOPRIM Take 300 mg by mouth daily.   amLODipine 5 MG tablet Commonly known as: NORVASC Take 1 tablet (5 mg total) by mouth daily. What changed:   medication strength  how much to take   ferrous sulfate 325 (65 FE) MG tablet Take 1 tablet (325 mg total) by mouth 2 (two) times daily with a meal.   HYDROcodone-acetaminophen 5-325 MG tablet Commonly known as: NORCO/VICODIN Take 2 tablets by mouth at bedtime as needed. What changed: reasons to take this   levothyroxine 50 MCG tablet Commonly known as: SYNTHROID Take 50 mcg by mouth daily before breakfast.   lidocaine 5 % Commonly known as: Lidoderm Place 1 patch onto the skin daily. Remove & Discard patch within 12 hours or as directed by MD What changed:   when to take this  reasons to take this   multivitamin with minerals Tabs tablet Take 1 tablet by mouth daily.   ondansetron 4 MG disintegrating tablet Commonly known as: Zofran ODT Take 1 tablet (4 mg total) by mouth every 8 (eight) hours as needed for nausea or vomiting.   pantoprazole 40 MG tablet Commonly known as: PROTONIX Take 40 mg by mouth daily.   Phillips Stool Softener 100 MG capsule Generic drug: docusate sodium Take 100 mg by mouth daily as needed for mild constipation.   rivaroxaban 20 MG Tabs tablet Commonly known as: XARELTO Take 20 mg by mouth in the morning.   timolol 0.5 % ophthalmic solution Commonly known as: BETIMOL Place 1 drop into  the left eye in the morning.       Discharge Assessment: Vitals:   11/17/19 2131 11/18/19 0604  BP: (!) 101/59 (!) 99/57  Pulse: 76 83  Resp: 20 16  Temp: 98.9 F (37.2 C) 99.4 F (37.4 C)  SpO2: 98% 99%   Skin clean, dry and intact without evidence of skin break down, no evidence of skin tears noted. IV catheter discontinued intact. Site without signs and symptoms of complications - no redness or edema noted at insertion site, patient denies c/o pain - only slight tenderness at site.  Dressing with slight pressure applied.  D/c Instructions-Education: Discharge instructions given to Mr. Chaim Swaringen son and Jabrell Raczkowski patient with verbalized understanding. D/c education completed with patient/family including follow up instructions, medication list, d/c activities limitations if indicated, with other d/c instructions as indicated by MD - patient able to verbalize understanding, all questions fully answered. Patient instructed to return to ED, call 911, or call MD for any changes in condition.  Patient escorted via [place], and D/C home via private auto.  Berton Bon, RN 11/18/2019 4:25 PM

## 2019-11-18 NOTE — H&P (Signed)
Chief Complaint: Patient was seen in consultation today for pelvic adenopathy bx at the request of Katragadda,Sreedhar  Referring Physician(s): Katragadda,Sreedhar  Supervising Physician: Arne Cleveland  Patient Status: [facility] - Out-pt  History of Present Illness: Steven Gutierrez is a 86 y.o. male being worked up newly diagnosed prostate cancer. He is found to have pelvic adenopathy on imaging. IR is asked to perform bx of pelvic LN for staging. PMHx, meds, labs, imaging, allergies reviewed. Had to be admitted to [facility] for overall weakness and some nausea yesterday, but feeling better today. Xarelto has been held Has been NPO today as directed.    Past Medical History:  Diagnosis Date  . A-fib ([employer])   . Cancer ([place])    skin cancer  . Glaucoma 11/17/2019  . Gout 11/17/2019  . Hypertension   . Hypothyroidism 11/17/2019    Past Surgical History:  Procedure Laterality Date  . ESOPHAGOGASTRODUODENOSCOPY  03/2018  . TRANSURETHRAL RESECTION OF PROSTATE  04/21/2019    Allergies: Oxycodone  Medications: Prior to Admission medications   Medication Sig Start Date End Date Taking? Authorizing Provider  acetaminophen (TYLENOL) 500 MG tablet Take 1,000 mg by mouth daily as needed for fever (for pain.).    Yes [provider]  amLODipine (NORVASC) 10 MG tablet Take 10 mg by mouth daily.   Yes [provider]  ferrous sulfate 325 (65 FE) MG tablet Take 1 tablet (325 mg total) by mouth 2 (two) times daily with a meal. Patient not taking: Reported on 11/17/2019 09/07/19  Yes Amin, Jeanella Flattery, MD  HYDROcodone-acetaminophen (NORCO/VICODIN) 5-325 MG tablet Take 2 tablets by mouth at bedtime as needed. Patient taking differently: Take 2 tablets by mouth at bedtime as needed for moderate pain.  11/05/19  Yes Derek Jack, MD  levothyroxine (SYNTHROID) 50 MCG tablet Take 50 mcg by mouth daily before breakfast.   Yes [provider]  lidocaine (LIDODERM) 5 %  Place 1 patch onto the skin daily. Remove & Discard patch within 12 hours or as directed by MD Patient taking differently: Place 1 patch onto the skin daily as needed (pain). Remove & Discard patch within 12 hours or as directed by MD 10/21/19  Yes Henderly, Britni A, PA-C  Multiple Vitamin (MULTIVITAMIN WITH MINERALS) TABS tablet Take 1 tablet by mouth daily.   Yes [provider]  ondansetron (ZOFRAN ODT) 4 MG disintegrating tablet Take 1 tablet (4 mg total) by mouth every 8 (eight) hours as needed for nausea or vomiting. 10/21/19  Yes Henderly, Britni A, PA-C  pantoprazole (PROTONIX) 40 MG tablet Take 40 mg by mouth daily.    Yes [provider]  rivaroxaban (XARELTO) 20 MG TABS tablet Take 20 mg by mouth in the morning.    Yes [provider]  allopurinol (ZYLOPRIM) 300 MG tablet Take 300 mg by mouth daily.    [provider]  docusate sodium (PHILLIPS STOOL SOFTENER) 100 MG capsule Take 100 mg by mouth daily as needed for mild constipation.    [provider]  timolol (BETIMOL) 0.5 % ophthalmic solution Place 1 drop into the left eye in the morning.    [provider]     Family History  Problem Relation Age of Onset  . Heart attack Mother   . Stroke Father   . Heart attack Sister   . Lung cancer Brother     Social History   Socioeconomic History  . Marital status: Widowed    Spouse name: Not on file  .  Number of children: 2  . Years of education: Not on file  . Highest education level: Not on file  Occupational History  . Occupation: Retired  Tobacco Use  . Smoking status: Former Smoker    Packs/day: 0.50    Types: Cigarettes  . Smokeless tobacco: Never Used  Substance and Sexual Activity  . Alcohol use: Yes    Alcohol/week: 1.0 standard drinks    Types: 1 Glasses of wine per week    Comment: occ  . Drug use: Never  . Sexual activity: Not Currently  Other Topics Concern  . Not on file  Social History Narrative  .  Not on file   Social Determinants of Health   Financial Resource Strain: Low Risk   . Difficulty of Paying Living Expenses: Not hard at all  Food Insecurity: No Food Insecurity  . Worried About Charity fundraiser in the Last Year: Never true  . Ran Out of Food in the Last Year: Never true  Transportation Needs: No Transportation Needs  . Lack of Transportation (Medical): No  . Lack of Transportation (Non-Medical): No  Physical Activity: Insufficiently Active  . Days of Exercise per Week: 3 days  . Minutes of Exercise per Session: 30 min  Stress: No Stress Concern Present  . Feeling of Stress : Not at all  Social Connections: Somewhat Isolated  . Frequency of Communication with Friends and Family: More than three times a week  . Frequency of Social Gatherings with Friends and Family: More than three times a week  . Attends Religious Services: More than 4 times per year  . Active Member of Clubs or Organizations: No  . Attends Archivist Meetings: Never  . Marital Status: Widowed     Review of Systems: A 12 point ROS discussed and pertinent positives are indicated in the HPI above.  All other systems are negative.  Review of Systems  Vital Signs: There were no vitals taken for this visit.   Physical Exam Constitutional:      Appearance: Normal appearance.  HENT:     Mouth/Throat:     Mouth: Mucous membranes are moist.     Pharynx: Oropharynx is clear.  Cardiovascular:     Rate and Rhythm: Normal rate and regular rhythm.     Heart sounds: Normal heart sounds.  Pulmonary:     Effort: Pulmonary effort is normal. No respiratory distress.     Breath sounds: Normal breath sounds.  Abdominal:     General: Abdomen is flat. There is no distension.     Palpations: Abdomen is soft. There is no mass.     Tenderness: There is no abdominal tenderness.  Skin:    General: Skin is warm and dry.  Neurological:     General: No focal deficit present.     Mental Status: He  is alert and oriented to person, place, and time.  Psychiatric:        Mood and Affect: Mood normal.        Thought Content: Thought content normal.        Judgment: Judgment normal.      Imaging: DG Chest 2 View  Result Date: 11/17/2019 CLINICAL DATA:  Possible sepsis EXAM: CHEST - 2 VIEW COMPARISON:  09/04/2019 FINDINGS: Diffuse bilateral interstitial and somewhat nodular appearing opacities. No pleural effusion. Cardiomegaly with aortic atherosclerosis. No pneumothorax. IMPRESSION: Probable underlying chronic interstitial disease. Diffuse bilateral interstitial and nodular opacities suspect for acute superimposed infectious or inflammatory process, possible  atypical or viral pneumonia. Electronically Signed   By: Donavan Foil M.D.   On: 11/17/2019 18:53   MR LUMBAR SPINE WO CONTRAST  Result Date: 10/21/2019 CLINICAL DATA:  Initial evaluation for acute mid to lower back pain. EXAM: MRI LUMBAR SPINE WITHOUT CONTRAST TECHNIQUE: Multiplanar, multisequence MR imaging of the lumbar spine was performed. No intravenous contrast was administered. COMPARISON:  None available. FINDINGS: Segmentation:  Examination degraded by motion artifact. Standard segmentation. Lowest well-formed disc space labeled the L5-S1 level. Alignment: 3 mm anterolisthesis of L4 on L5, with trace 2 mm anterolisthesis of L5 on S1. Findings are chronic and facet mediated. Vertebrae: Abnormal T1 hypointensity the, stir hyperintense signal intensity seen replacing the L2 vertebral body, consistent with osseous metastasis. No associated pathologic fracture. Additional metastatic lesion seen involving the left posterior aspect of T12 with extension into the left pedicle (series 5, image 13). No other discrete osseous lesions. Underlying bone marrow signal intensity within normal limits. No other abnormal marrow edema. Conus medullaris and cauda equina: Conus extends to the L1 level. Conus and cauda equina appear normal. No epidural  tumor or mass. Paraspinal and other soft tissues: Extensive bulky retroperitoneal adenopathy partially visualized, concerning for nodal metastases. Associated scattered soft tissue stranding with mild edema within the adjacent anteromedial aspects of both psoas muscles. Right kidney is atrophic with a few scattered cysts present, largest of which measures 3.5 cm. Disc levels: T11-12: Disc desiccation with minimal disc bulge. Mild facet hypertrophy. No significant stenosis. T12-L1: Disc desiccation with minimal disc bulge. No stenosis. L1-2: Disc desiccation without disc bulge. Mild facet hypertrophy. No stenosis. L2-3: Disc desiccation with minimal disc bulge. Mild facet hypertrophy. No stenosis. L3-4: Disc desiccation with mild disc bulge. Mild facet hypertrophy. No stenosis. L4-5: Anterolisthesis. Mild diffuse disc bulge with disc desiccation. Superimposed endplate Schmorl's node deformity. Moderate facet and ligament flavum hypertrophy. Resultant severe spinal stenosis. Mild right greater than left L4 foraminal narrowing. L5-S1: Trace anterolisthesis. Mild diffuse disc bulge with disc desiccation. Moderate facet hypertrophy. Resultant mild bilateral lateral recess stenosis. Mild bilateral L5 foraminal narrowing. IMPRESSION: 1. Osseous metastases involving the L2 and T12 vertebral bodies as above, of uncertain origin. No associated pathologic fracture or extra osseous extension of tumor. 2. Extensive bulky retroperitoneal adenopathy, concerning for nodal metastases. Further evaluation with dedicated cross-sectional imaging recommended. 3. Multifactorial degenerative changes at L4-5 with resultant severe spinal stenosis. Electronically Signed   By: Jeannine Boga M.D.   On: 10/21/2019 21:13   CT ABDOMEN PELVIS W CONTRAST  Result Date: 11/17/2019 CLINICAL DATA:  Low-grade fever and fatigue EXAM: CT ABDOMEN AND PELVIS WITH CONTRAST TECHNIQUE: Multidetector CT imaging of the abdomen and pelvis was performed  using the standard protocol following bolus administration of intravenous contrast. CONTRAST:  164mL OMNIPAQUE IOHEXOL 300 MG/ML  SOLN COMPARISON:  PET CT 11/10/2019, MRI 10/21/2019 FINDINGS: Lower chest: Lung bases demonstrate scattered areas of scarring at the lingula and left base. No acute consolidation. Trace left pleural effusion. Cardiomegaly. Coronary vascular calcification. Hepatobiliary: No calcified gallstone. No focal hepatic abnormality or biliary dilatation Pancreas: Unremarkable. No pancreatic ductal dilatation or surrounding inflammatory changes. Spleen: Accessory splenule. Spleen otherwise normal. Adrenals/Urinary Tract: Adrenal glands are normal. Probable exophytic cyst off the upper pole of the right kidney. Atrophic right kidney with cortical scarring at the mid to upper pole. New mild left hydronephrosis and hydroureter without definitive obstructing stone. Soft tissue inflammatory changes about the mid ureter anterior to left retroperitoneal adenopathy. Thick-walled urinary bladder with soft tissue stranding. Stomach/Bowel:  The stomach is nonenlarged. No dilated small bowel. No colon wall thickening. Diverticular disease of the colon without acute inflammatory change. Vascular/Lymphatic: Extensive aortic atherosclerosis. No aneurysmal dilatation. Bulky retroperitoneal adenopathy which appears increased. 3.4 cm right para-aortic node, series 2, image number 32, compared with 2.3 cm previously. 4.4 cm enlarged left common iliac node, series 2, image number 51, previously 3.4 cm. Now demonstrates internal low attenuation consistent with necrosis. Surrounding inflammatory process. Left pelvic sidewall node measures 3.5 cm, series 2, image number 68, previously 3 cm. This also demonstrates internal low attenuation consistent with necrosis. Increased soft tissue inflammatory changes. Enlarged left inguinal lymph nodes, also increased. Reproductive: Postprocedure changes of the prostate Other: No free  air. Mild presacral soft tissue stranding and edema. Slight increased fluid and soft tissue thickening at the pararenal spaces. Musculoskeletal: 3.4 cm soft tissue mass with involvement of the right posterior acetabulum, previously 3.3 cm. Diffuse sclerosis of L2. Known T12 metastatic pedicle lesion is better seen on MRI. IMPRESSION: 1. Cardiomegaly without acute airspace disease at the lung bases. Trace left pleural effusion. 2. Bulky metastatic adenopathy within the abdomen and pelvis as described above. Multiple nodes appear increased in size and now demonstrate internal low density suggesting necrosis. Increased inflammatory changes about the enlarged lymph nodes. 3. New mild left hydronephrosis and hydroureter, suspect that source of obstruction is related to the soft tissue inflammatory changes at the mid ureter in the region of left retroperitoneal adenopathy. 4. Thick-walled urinary bladder with inflammatory change, possible cystitis 5. Slight increased fluid and soft tissue thickening in the pararenal spaces and presacral space. 6. Grossly stable skeletal metastatic disease. Aortic Atherosclerosis (ICD10-I70.0). Electronically Signed   By: Donavan Foil M.D.   On: 11/17/2019 18:51   NM PET Image Initial (PI) Skull Base To Thigh  Result Date: 11/11/2019 CLINICAL DATA:  Initial treatment strategy for prostate cancer with retroperitoneal lymphadenopathy. EXAM: NUCLEAR MEDICINE PET SKULL BASE TO THIGH TECHNIQUE: 10.2 mCi F-18 FDG was injected intravenously. Full-ring PET imaging was performed from the skull base to thigh after the radiotracer. CT data was obtained and used for attenuation correction and anatomic localization. Fasting blood glucose: 90 mg/dl COMPARISON:  MRI lumbar spine dated 10/21/2019 FINDINGS: Mediastinal blood pool activity: SUV max 2.4 Liver activity: SUV max NA NECK: No hypermetabolic cervical lymphadenopathy. Incidental CT findings: none CHEST: Multifocal patchy pulmonary opacities,  favoring chronic interstitial lung disease or post infectious/inflammatory scarring, without associated hypermetabolism. Thoracic lymphadenopathy, including: --2.1 cm short axis node at the left thoracic inlet (series 3/image 69), max SUV 7.3 --1.9 cm short axis lower paraesophageal lymph node adjacent to the subcarinal region (series 3/image 101), max SUV 6.1 ---1.8 cm short axis right retrocrural node (series 3/image 145), max SUV 6.8 Incidental CT findings: Mild cardiomegaly. Atherosclerotic calcifications of the aortic arch. 3 vessel coronary atherosclerosis. ABDOMEN/PELVIS: No abnormal hypermetabolism in the liver, spleen, pancreas, or adrenal glands. Widespread retroperitoneal and pelvic lymphadenopathy, including: --2.3 cm short axis right para-aortic node (series 3/image 168), max SUV 6.2 --3.4 cm short axis left common iliac node (series 3/image 199), max SUV 12.1 --1.7 cm short axis right common iliac node (series 3/image 213), max SUV 10.5 --3.0 cm short axis left pelvic sidewall node (series 3/image 231), max SUV 12.9 --1.2 cm short axis left inferior inguinal node (series 3/image 262), without appreciable hypermetabolism, max SUV 1.4 Postprocedural changes/volume loss of the right prostate (series 3/image 253). Incidental CT findings: Atherosclerotic calcifications the abdominal aorta and branch vessels. Left colonic diverticulosis, without evidence  of diverticulitis. Thick-walled bladder. SKELETON: 3.3 cm destructive soft tissue mass involving the right posterior column of the acetabulum (series 3/image 246), max SUV 10.9. Additional multifocal osseous metastases, including --T10 vertebral body, max SUV 3.7 --Left posterior elements at T12, max SUV 3.9 --L2 vertebral body with sclerosis on CT (series 3/image 169), max SUV 6.6 --Left inferior pubic ramus with sclerosis on CT (series 3/image 263), max SUV 5.2 Incidental CT findings: Degenerative changes of the visualized thoracolumbar spine. IMPRESSION:  Nodal metastases involving the chest, abdomen, and pelvis, as described above. Osseous metastases, including a destructive soft tissue mass involving the right posterior column of the acetabulum, as above. Postprocedural changes/volume loss of the right prostate. Additional ancillary findings as above. Electronically Signed   By: Julian Hy M.D.   On: 11/11/2019 07:54    Labs:  CBC: Recent Labs    09/06/19 0430 10/21/19 2034 11/17/19 1604 11/18/19 0444  WBC 14.4* 11.0* 13.7* 9.8  HGB 9.2* 10.5* 10.3* 8.8*  HCT 29.5* 35.0* 34.3* 29.8*  PLT 308 381 331 328    COAGS: Recent Labs    11/17/19 1604 11/18/19 0444  INR 1.5* 1.4*  APTT 55*  --     BMP: Recent Labs    09/06/19 0430 10/21/19 2034 11/17/19 1604 11/18/19 0444  NA 137 132* 132* 136  K 4.1 3.7 3.9 4.1  CL 101 97* 97* 101  CO2 26 23 23 26   GLUCOSE 141* 106* 119* 99  BUN 41* 19 19 17   CALCIUM 9.0 8.7* 8.7* 8.7*  CREATININE 0.95 1.01 0.88 0.85  GFRNONAA >60 >60 >60 >60  GFRAA >60 >60 >60 >60    LIVER FUNCTION TESTS: Recent Labs    09/04/19 0412 09/05/19 0133 09/06/19 0430 11/17/19 1604  BILITOT 0.4 0.7 0.8 0.6  AST 52* 79* 61* 31  ALT 32 52* 50* 20  ALKPHOS 121 120 131* 174*  PROT 7.6 7.1 7.1 7.7  ALBUMIN 3.6 3.5 3.6 3.7    TUMOR MARKERS: No results for input(s): AFPTM, CEA, CA199, CHROMGRNA in the last 8760 hours.  Assessment and Plan: Pelvic adenopathy in setting of prostate cancer. For CT guided bx Labs reviewed. Risks and benefits of pelvic LN bx was discussed with the patient and/or patient's family including, but not limited to bleeding, infection, damage to adjacent structures or low yield requiring additional tests.  All of the questions were answered and there is agreement to proceed.  Consent signed and in chart.    Thank you for this interesting consult.  I greatly enjoyed meeting Kesaun Lampo and look forward to participating in their care.  A copy of this report was sent  to the requesting provider on this date.  Electronically Signed: Ascencion Dike, PA-C 11/18/2019, 9:37 AM   I spent a total of 20 minutes in face to face in clinical consultation, greater than 50% of which was counseling/coordinating care for pelvic LN bx

## 2019-11-18 NOTE — Sedation Documentation (Signed)
Report given to Lattie Haw, Therapist, sports at [facility].

## 2019-11-19 LAB — SURGICAL PATHOLOGY

## 2019-11-21 LAB — URINE CULTURE: Culture: 5000 — AB

## 2019-11-22 LAB — CULTURE, BLOOD (ROUTINE X 2)
Culture: NO GROWTH
Culture: NO GROWTH
Special Requests: ADEQUATE
Special Requests: ADEQUATE

## 2019-11-24 ENCOUNTER — Inpatient Hospital Stay (HOSPITAL_BASED_OUTPATIENT_CLINIC_OR_DEPARTMENT_OTHER): Payer: Medicare Other | Admitting: Hematology

## 2019-11-24 ENCOUNTER — Other Ambulatory Visit: Payer: Self-pay

## 2019-11-24 VITALS — BP 142/60 | HR 78 | Temp 96.8°F | Resp 19 | Wt 186.7 lb

## 2019-11-24 DIAGNOSIS — C7951 Secondary malignant neoplasm of bone: Secondary | ICD-10-CM

## 2019-11-24 DIAGNOSIS — C61 Malignant neoplasm of prostate: Secondary | ICD-10-CM | POA: Diagnosis not present

## 2019-11-24 DIAGNOSIS — Z191 Hormone sensitive malignancy status: Secondary | ICD-10-CM | POA: Diagnosis not present

## 2019-11-24 MED ORDER — DEGARELIX ACETATE(240 MG DOSE) 120 MG/VIAL ~~LOC~~ SOLR
240.0000 mg | Freq: Once | SUBCUTANEOUS | Status: AC
  Administered 2019-11-24: 240 mg via SUBCUTANEOUS
  Filled 2019-11-24: qty 6

## 2019-11-24 MED ORDER — ABIRATERONE ACETATE 250 MG PO TABS: 1000.0000 mg | ORAL_TABLET | Freq: Every day | ORAL | 0 refills | Status: DC

## 2019-11-24 MED ORDER — DENOSUMAB 120 MG/1.7ML ~~LOC~~ SOLN
120.0000 mg | Freq: Once | SUBCUTANEOUS | Status: AC
  Administered 2019-11-24: 120 mg via SUBCUTANEOUS
  Filled 2019-11-24: qty 1.7

## 2019-11-24 MED ORDER — PREDNISONE 5 MG PO TABS: 5.0000 mg | ORAL_TABLET | Freq: Every day | ORAL | 6 refills | Status: DC

## 2019-11-24 NOTE — Progress Notes (Signed)
Late entry: 1500: I met with patient and his son today after the visit with Dr. Delton Coombes.  I provided written education material on Firmagon, Xgeva, Zytiga, Prednison.  I explained that he would get more information on Lupron at a later time when Dr. Delton Coombes switches him over.  I explained the expectations on timing of the medication.  I explained the process of the specialty pharmacy and that they would be in touch with him about cost and shipment.  Him and his son were given the opportunity to ask questions and all were answered to their satisfaction.

## 2019-11-24 NOTE — Progress Notes (Signed)
Deatra Ina given today verbal order Dr. Delton Coombes. Patient taking calcium as directed.  Denied tooth, jaw, and leg pain.  No recent or upcoming dental visits.  Labs reviewed.  Patient tolerated injection with no complaints voiced.  See MAR for details.  Sites clean and dry with no bruising or swelling noted.  Band aids applied.  Vss with discharge and left ambulatory with no s/s of distress noted.

## 2019-11-24 NOTE — Patient Instructions (Addendum)
[place] at [facility] Discharge Instructions  You were seen today by Dr. Delton Coombes. He went over your recent test results. Your biopsy showed that you have prostate cancer. He wants to start you on treatment that will help control the growth of your cancer.  You will get an injection called Ruelas Koller that you will get monthly.  Eshleman Koller - this works to suppress your testosterone levels.  This injection will start working immediately.  At your next visit we will switch you to Lupron which is another shot that decreases the testosterone levels but this one is given once every 6 months.  You will also need chemotherapy to help control the cancer.  He will give you chemotherapy in the form of a pill, Zytiga.  It will come from a special pharmacy.  You will take it daily.  You will also need to take prednisone - a steroid pill - daily with your Zytiga pill.  This helps maximize the effects of the pills.  You will also need an injection monthly to help strengthen your bones.  Your bones have been weakened by your cancer.  These shots will pull calcium back into your bones to make them stronger.  This injection is called Xgeva.  He talked with you about the efficacy of the medications and these typically work for approximately 3 years but we will monitor your PSA levels at each visit.  This will let us know if the chemotherapy and injections are working.  He talked with you about the side effects of the treatments.  He discussed tiredness - you need to fight it with activity.   They can also cause hot flashes which is due to the decrease in testosterone.  Because of the steroid that you are taking, it can cause your blood sugars to be elevated and could cause your blood pressure to be slightly elevated.  We will watch this with each visit as well.   With the first week of the chemotherapy pill, he wants you to take 500 mg daily.  That is 2 pills daily.  You will do this for the first week and  then you will increase to 1000mg  daily which will be 4 pills.  Start taking the prednisone when you start taking the zytiga.  There is no need to start it before hand.  You will start to see improvement in your pain levels and how you are feeling within the first few weeks.  You can continue taking your pain medication as needed for pain.    You will need to add calcium 600mg  and vitamin D 500mg  twice daily.  This helps to keep your calcium levels adequate.  Dr. Delton Coombes wants to see you back in 3 weeks, this will need to be changed if you haven't gotten your Zytiga.  He wants to see you 2 weeks after starting the pill.     Thank you for choosing [place] at [facility] to provide your oncology and hematology care.  To afford each patient quality time with our provider, please arrive at least 15 minutes before your scheduled appointment time.   If you have a lab appointment with the [facility] please come in thru the  Main Entrance and check in at the main information desk  You need to re-schedule your appointment should you arrive 10 or more minutes late.  We strive to give you quality time with our providers, and arriving late affects you and  other patients whose appointments are after yours.  Also, if you no show three or more times for appointments you may be dismissed from the clinic at the providers discretion.     Again, thank you for choosing [facility].  Our hope is that these requests will decrease the amount of time that you wait before being seen by our physicians.       _____________________________________________________________  Should you have questions after your visit to [facility], please contact our office at [phone] between the hours of 8:00 a.m. and 4:30 p.m.  Voicemails left after 4:00 p.m. will not be returned until the following business day.  For prescription refill requests, have your pharmacy contact our  office and allow 72 hours.    Cancer Center Support Programs:   > Cancer Support Group  2nd Tuesday of the month 1pm-2pm, Journey Room

## 2019-11-24 NOTE — Assessment & Plan Note (Signed)
1.  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  MRI of the lumbar spine showed bone metastasis involving L2, T12 vertebral bodies with no extraosseous tumor.  Extensive bulky retroperitoneal adenopathy. -TURP by Dr. Titus Mould in [place] around September 2020 and was told to have low-grade prostate cancer. -PSA was 390.  LDH was 222. -PET scan on 11/10/2019 showed nodal metastasis involving chest, abdomen and pelvis.  Bone metastasis including destructive soft tissue mass involving right posterior column of the chest abdomen. -Left external iliac lymph node biopsy on 11/18/2019 consistent with prostatic adenocarcinoma. -I have recommended germline mutation testing. -We reviewed the pathology report in detail.  We also discussed the prognosis of castration sensitive prostate cancer with life expectancy measured in years. -I have recommended androgen deprivation therapy.  We will start him on Firmagon 240 mg today to decrease his flare.  I will switch him to Lupron 45 mg in a month. -We also talked about options including 6 cycles of docetaxel versus Abiraterone until progression. -Because of his age, we lean towards abiraterone.  We will start him at 500 mg daily and increase it to 1000 mg daily in 1 to 2 weeks.  We talked about the side effects including hypertension, hypokalemia, fatigue among others. -We will also start him on prednisone 5 mg daily along with it.  I have sent prescriptions for both.  I will see him back in 7 to 10 days after the start of Abiraterone.  2.  Low back pain: -He was using lidocaine patches.  He was also taking hydrocodone 5/325 as needed. -For the last few days, he is not using lidocaine patches or hydrocodone.  This will likely improve with ADT and abiraterone.  3.  Atrial fibrillation: -He is taking Xarelto without any bleeding issues.  4.  Bone metastasis: -We talked about  starting him on bone protecting agents like denosumab to decrease skeletal related events.  We talked about side effects including rare chance of ONJ. -he was told to start taking calcium pills twice daily. -He will receive his first injection today.

## 2019-11-24 NOTE — Progress Notes (Signed)
[place], [place] [address]   CLINIC:  Medical Oncology/Hematology  PCP:  [name], MD [place] [address] [phone]   REASON FOR VISIT:  Follow-up for metastatic prostate cancer.  PRIOR THERAPY: None  NGS Results: Test pending  CURRENT THERAPY: ADT and abiraterone with prednisone.    INTERVAL HISTORY:  [name] 86 y.o. male seen for follow-up of metastatic prostate cancer.  Appetite is 75%.  Energy levels are 25%.  He is accompanied by his son.  Pain in the lower back is reported as 3 out of 10.  Had Covid infection in February and has some resulting cough.  He reports increased frequency of urination at nighttime.  Also reports some dizziness.    REVIEW OF SYSTEMS:  Review of Systems  Respiratory: Positive for cough.   Genitourinary: Positive for frequency.   Neurological: Positive for dizziness.  All other systems reviewed and are negative.    PAST MEDICAL/SURGICAL HISTORY:  Past Medical History:  Diagnosis Date  . A-fib ([place])   . Cancer ([place])    skin cancer  . Glaucoma 11/17/2019  . Gout 11/17/2019  . Hypertension   . Hypothyroidism 11/17/2019   Past Surgical History:  Procedure Laterality Date  . ESOPHAGOGASTRODUODENOSCOPY  03/2018  . TRANSURETHRAL RESECTION OF PROSTATE  04/21/2019     SOCIAL HISTORY:  Social History   Socioeconomic History  . Marital status: Widowed    Spouse name: Not on file  . Number of children: 2  . Years of education: Not on file  . Highest education level: Not on file  Occupational History  . Occupation: Retired  Tobacco Use  . Smoking status: Former Smoker    Packs/day: 0.50    Types: Cigarettes  . Smokeless tobacco: Never Used  Substance and Sexual Activity  . Alcohol use: Yes    Alcohol/week: 1.0 standard drinks    Types: 1 Glasses of wine per week    Comment: occ  . Drug use: Never  . Sexual activity: Not Currently  Other Topics Concern  . Not  on file  Social History Narrative  . Not on file   Social Determinants of Health   Financial Resource Strain: Low Risk   . Difficulty of Paying Living Expenses: Not hard at all  Food Insecurity: No Food Insecurity  . Worried About Charity fundraiser in the Last Year: Never true  . Ran Out of Food in the Last Year: Never true  Transportation Needs: No Transportation Needs  . Lack of Transportation (Medical): No  . Lack of Transportation (Non-Medical): No  Physical Activity: Insufficiently Active  . Days of Exercise per Week: 3 days  . Minutes of Exercise per Session: 30 min  Stress: No Stress Concern Present  . Feeling of Stress : Not at all  Social Connections: Somewhat Isolated  . Frequency of Communication with Friends and Family: More than three times a week  . Frequency of Social Gatherings with Friends and Family: More than three times a week  . Attends Religious Services: More than 4 times per year  . Active Member of Clubs or Organizations: No  . Attends Archivist Meetings: Never  . Marital Status: Widowed  Intimate Partner Violence: Not At Risk  . Fear of Current or Ex-Partner: No  . Emotionally Abused: No  . Physically Abused: No  . Sexually Abused: No    FAMILY HISTORY:  Family History  Problem Relation Age  of Onset  . Heart attack Mother   . Stroke Father   . Heart attack Sister   . Lung cancer Brother     CURRENT MEDICATIONS:  Outpatient Encounter Medications as of 11/24/2019  Medication Sig  . allopurinol (ZYLOPRIM) 300 MG tablet Take 300 mg by mouth daily.  Marland Kitchen amLODipine (NORVASC) 5 MG tablet Take 1 tablet (5 mg total) by mouth daily.  Marland Kitchen levothyroxine (SYNTHROID) 50 MCG tablet Take 50 mcg by mouth daily before breakfast.  . Multiple Vitamin (MULTIVITAMIN WITH MINERALS) TABS tablet Take 1 tablet by mouth daily.  . pantoprazole (PROTONIX) 40 MG tablet Take 40 mg by mouth daily.   . rivaroxaban (XARELTO) 20 MG TABS tablet Take 20 mg by mouth in  the morning.   . timolol (BETIMOL) 0.5 % ophthalmic solution Place 1 drop into the left eye in the morning.  Marland Kitchen abiraterone acetate (ZYTIGA) 250 MG tablet Take 4 tablets (1,000 mg total) by mouth daily. Take on an empty stomach 1 hour before or 2 hours after a meal Take 500 mg daily for the first week followed by 1000 mg daily.  Marland Kitchen acetaminophen (TYLENOL) 500 MG tablet Take 1,000 mg by mouth daily as needed for fever (for pain.).   Marland Kitchen docusate sodium (PHILLIPS STOOL SOFTENER) 100 MG capsule Take 100 mg by mouth daily as needed for mild constipation.  Marland Kitchen HYDROcodone-acetaminophen (NORCO/VICODIN) 5-325 MG tablet Take 2 tablets by mouth at bedtime as needed. (Patient not taking: Reported on 11/24/2019)  . lidocaine (LIDODERM) 5 % Place 1 patch onto the skin daily. Remove & Discard patch within 12 hours or as directed by MD (Patient not taking: Reported on 11/24/2019)  . ondansetron (ZOFRAN ODT) 4 MG disintegrating tablet Take 1 tablet (4 mg total) by mouth every 8 (eight) hours as needed for nausea or vomiting. (Patient not taking: Reported on 11/24/2019)  . predniSONE (DELTASONE) 5 MG tablet Take 1 tablet (5 mg total) by mouth daily with breakfast.  . [DISCONTINUED] ferrous sulfate 325 (65 FE) MG tablet Take 1 tablet (325 mg total) by mouth 2 (two) times daily with a meal. (Patient not taking: Reported on 11/17/2019)  . [EXPIRED] degarelix (FIRMAGON) injection 240 mg   . [EXPIRED] denosumab (XGEVA) injection 120 mg    No facility-administered encounter medications on file as of 11/24/2019.    ALLERGIES:  Allergies  Allergen Reactions  . Oxycodone Nausea And Vomiting     PHYSICAL EXAM:  ECOG Performance status: 1  Vitals:   11/24/19 1425  BP: (!) 142/60  Pulse: 78  Resp: 19  Temp: (!) 96.8 F (36 C)  SpO2: 100%   Filed Weights   11/24/19 1425  Weight: 186 lb 11.2 oz (84.7 kg)   Physical Exam Vitals reviewed.  Constitutional:      Appearance: Normal appearance.  HENT:     Head:  Normocephalic.  Eyes:     Conjunctiva/sclera: Conjunctivae normal.  Skin:    General: Skin is warm.  Neurological:     General: No focal deficit present.     Mental Status: He is alert.  Psychiatric:        Mood and Affect: Mood normal.        Behavior: Behavior normal.      LABORATORY DATA:  I have reviewed the labs as listed.  CBC    Component Value Date/Time   WBC 9.8 11/18/2019 0444   RBC 3.71 (L) 11/18/2019 0444   HGB 8.8 (L) 11/18/2019 0444   HCT 29.8 (  L) 11/18/2019 0444   PLT 328 11/18/2019 0444   MCV 80.3 11/18/2019 0444   MCH 23.7 (L) 11/18/2019 0444   MCHC 29.5 (L) 11/18/2019 0444   RDW 18.3 (H) 11/18/2019 0444   LYMPHSABS 1.8 11/17/2019 1604   MONOABS 1.2 (H) 11/17/2019 1604   EOSABS 0.1 11/17/2019 1604   BASOSABS 0.0 11/17/2019 1604   CMP Latest Ref Rng & Units 11/18/2019 11/17/2019 10/21/2019  Glucose 70 - 99 mg/dL 99 119(H) 106(H)  BUN 8 - 23 mg/dL 17 19 19   Creatinine 0.61 - 1.24 mg/dL 0.85 0.88 1.01  Sodium 135 - 145 mmol/L 136 132(L) 132(L)  Potassium 3.5 - 5.1 mmol/L 4.1 3.9 3.7  Chloride 98 - 111 mmol/L 101 97(L) 97(L)  CO2 22 - 32 mmol/L 26 23 23   Calcium 8.9 - 10.3 mg/dL 8.7(L) 8.7(L) 8.7(L)  Total Protein 6.5 - 8.1 g/dL - 7.7 -  Total Bilirubin 0.3 - 1.2 mg/dL - 0.6 -  Alkaline Phos 38 - 126 U/L - 174(H) -  AST 15 - 41 U/L - 31 -  ALT 0 - 44 U/L - 20 -    DIAGNOSTIC IMAGING:  I have independently reviewed the scans and discussed with the patient.  ASSESSMENT & PLAN:  Metastatic castration-sensitive adenocarcinoma of prostate ([place]) 1.  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  MRI of the lumbar spine showed bone metastasis involving L2, T12 vertebral bodies with no extraosseous tumor.  Extensive bulky retroperitoneal adenopathy. -TURP by Dr. Titus Mould in [place] around September 2020 and was told to have low-grade prostate cancer. -PSA was 390.  LDH  was 222. -PET scan on 11/10/2019 showed nodal metastasis involving chest, abdomen and pelvis.  Bone metastasis including destructive soft tissue mass involving right posterior column of the chest abdomen. -Left external iliac lymph node biopsy on 11/18/2019 consistent with prostatic adenocarcinoma. -I have recommended germline mutation testing. -We reviewed the pathology report in detail.  We also discussed the prognosis of castration sensitive prostate cancer with life expectancy measured in years. -I have recommended androgen deprivation therapy.  We will start him on Firmagon 240 mg today to decrease his flare.  I will switch him to Lupron 45 mg in a month. -We also talked about options including 6 cycles of docetaxel versus Abiraterone until progression. -Because of his age, we lean towards abiraterone.  We will start him at 500 mg daily and increase it to 1000 mg daily in 1 to 2 weeks.  We talked about the side effects including hypertension, hypokalemia, fatigue among others. -We will also start him on prednisone 5 mg daily along with it.  I have sent prescriptions for both.  I will see him back in 7 to 10 days after the start of Abiraterone.  2.  Low back pain: -He was using lidocaine patches.  He was also taking hydrocodone 5/325 as needed. -For the last few days, he is not using lidocaine patches or hydrocodone.  This will likely improve with ADT and abiraterone.  3.  Atrial fibrillation: -He is taking Xarelto without any bleeding issues.  4.  Bone metastasis: -We talked about starting him on bone protecting agents like denosumab to decrease skeletal related events.  We talked about side effects including rare chance of ONJ. -he was told to start taking calcium pills twice daily. -He will receive his first injection today.     Orders placed this encounter:  Orders Placed  This Encounter  Procedures  . SCHEDULING COMMUNICATION  . Treatment conditions   Total time spent is 40  minutes with more than 50% of the time spent face-to-face discussing biopsy results, prognosis, new treatment plan, counseling and coordination of care.   Derek Jack, MD [place] [phone]

## 2019-12-01 ENCOUNTER — Other Ambulatory Visit (HOSPITAL_COMMUNITY): Payer: Self-pay | Admitting: *Deleted

## 2019-12-01 ENCOUNTER — Telehealth (HOSPITAL_COMMUNITY): Payer: Self-pay | Admitting: Pharmacist

## 2019-12-01 ENCOUNTER — Telehealth (HOSPITAL_COMMUNITY): Payer: Self-pay | Admitting: Pharmacy Technician

## 2019-12-01 DIAGNOSIS — Z191 Hormone sensitive malignancy status: Secondary | ICD-10-CM

## 2019-12-01 DIAGNOSIS — C61 Malignant neoplasm of prostate: Secondary | ICD-10-CM

## 2019-12-01 MED ORDER — ABIRATERONE ACETATE 250 MG PO TABS: 1000.0000 mg | ORAL_TABLET | Freq: Every day | ORAL | 0 refills | Status: DC

## 2019-12-01 NOTE — Telephone Encounter (Signed)
Oral Oncology Patient Advocate Encounter   Received notification from [employer] that prior authorization for Zytiga (Abiraterone) is required.   PA submitted on CoverMyMeds Key [id number] Status is pending   Oral Oncology Clinic will continue to follow.  [place] Patient [facility] Phone [phone] Fax [phone] 12/02/2019 11:05 AM

## 2019-12-01 NOTE — Telephone Encounter (Signed)
Oral Oncology Pharmacist Encounter  Received new prescription for Zytiga (abiraterone) for the treatment of metastatic castrate sensitive prostate cancer in conjunction with ADT therapy and prednisone, planned duration until disease progression or unacceptable drug toxicity.  CMP from 11/17/19 assessed, no relevant lab abnormalities. Prescription dose and frequency assessed.   Current medication list in Epic reviewed, no DDIs with abiraterone identified.  Prescription has been e-scribed to the [facility] for benefits analysis and approval.  Oral Oncology Clinic will continue to follow for insurance authorization, copayment issues, initial counseling and start date.  Darl Pikes, PharmD, BCPS, BCOP, CPP Hematology/Oncology Clinical Pharmacist [facility] [phone]  12/01/2019 4:55 PM

## 2019-12-02 ENCOUNTER — Telehealth (HOSPITAL_COMMUNITY): Payer: Self-pay | Admitting: Pharmacy Technician

## 2019-12-02 NOTE — Telephone Encounter (Signed)
Oral Oncology Patient Advocate Encounter  Prior Authorization for Zytiga (Abiraterone) has been approved.    PA# [id number] Effective dates: 11/02/2019 through 12/01/2022  Patients co-pay is $1023.21  Oral Oncology Clinic will continue to follow.   [place] Patient [place] Phone [phone] Fax [phone] 12/02/2019 11:10 AM

## 2019-12-02 NOTE — Telephone Encounter (Signed)
Oral Oncology Patient Advocate Encounter  Patient has been approved for copay assistance with [employer] (TAF).  The [place] will cover all copayment expenses for Zytiga for the remainder of the calendar year.    The billing information is as follows and has been shared with [place].   Member ID: [id number] Group ID: [id number] PCN: [id number] BIN: [id number] Eligibility Dates: 07/31/19 to 07/29/20  Fund: [place] Patient [place] Phone [phone] Fax [phone] 12/02/2019 11:30 AM

## 2019-12-04 MED FILL — ABIRATERONE ACETATE 250 MG: 250 | 30 days supply | Qty: 120 | Fill #0

## 2019-12-04 NOTE — Telephone Encounter (Signed)
Oral Chemotherapy Pharmacist Encounter  [facility] will deliver medication by . He knows to get started when he receives the [place] in the mail.  Patient Education I spoke with patient for overview of new oral chemotherapy medication:  Zytiga (abiraterone) for the treatment of metastatic castrate sensitive prostate cancer in conjunction with ADT therapy and prednisone, planned duration until disease progression or unacceptable drug toxicity.   Counseled patient on administration, dosing, side effects, monitoring, drug-food interactions, safe handling, storage, and disposal. Patient will take 2 tablets (500mg ) daily, then if tolerated he will increase to 4 tablets (1,000 mg total) by mouth daily. Take on an empty stomach 1 hour before or 2 hours after a meal.  Side effects include but not limited to: fatigue, HTN, hot flashes, decreased wbc.    Reviewed with patient importance of keeping a medication schedule and plan for any missed doses.  Mr. Philipp Deputy voiced understanding and appreciation. All questions answered. Medication handout placed in the mail.  Provided patient with [facility] [place] [facility] phone number. Patient knows to call the office with questions or concerns. Oral Chemotherapy Navigation Clinic will continue to follow.  Darl Pikes, PharmD, BCPS, BCOP, CPP Hematology/Oncology Clinical Pharmacist Practitioner [facility] [phone]  12/04/2019 1:57 PM

## 2019-12-08 ENCOUNTER — Encounter (HOSPITAL_COMMUNITY): Payer: Self-pay | Admitting: *Deleted

## 2019-12-08 NOTE — Progress Notes (Signed)
Patient called to advise that he received his medication yesterday and took his first dose this morning.  He was originally scheduled for this week but will be pushed out for 2 weeks per Dr. Tomie China recommendations.  Patient is aware to take the medication at the same time every day.  We will check labs at his next visit.

## 2019-12-10 ENCOUNTER — Ambulatory Visit (HOSPITAL_COMMUNITY): Payer: Medicare Other | Admitting: Genetic Counselor

## 2019-12-10 ENCOUNTER — Inpatient Hospital Stay (HOSPITAL_COMMUNITY): Payer: Medicare Other

## 2019-12-10 ENCOUNTER — Ambulatory Visit (HOSPITAL_COMMUNITY): Payer: Medicare Other | Admitting: Hematology

## 2019-12-10 ENCOUNTER — Other Ambulatory Visit (HOSPITAL_COMMUNITY): Payer: Medicare Other

## 2019-12-10 ENCOUNTER — Encounter (HOSPITAL_COMMUNITY): Payer: Medicare Other | Admitting: Genetic Counselor

## 2019-12-21 ENCOUNTER — Other Ambulatory Visit (HOSPITAL_COMMUNITY): Payer: Self-pay | Admitting: *Deleted

## 2019-12-21 DIAGNOSIS — C61 Malignant neoplasm of prostate: Secondary | ICD-10-CM

## 2019-12-21 DIAGNOSIS — C7951 Secondary malignant neoplasm of bone: Secondary | ICD-10-CM

## 2019-12-21 DIAGNOSIS — Z191 Hormone sensitive malignancy status: Secondary | ICD-10-CM

## 2019-12-22 ENCOUNTER — Inpatient Hospital Stay (HOSPITAL_COMMUNITY): Payer: Medicare Other

## 2019-12-22 ENCOUNTER — Ambulatory Visit (HOSPITAL_COMMUNITY): Payer: Medicare Other

## 2019-12-22 ENCOUNTER — Ambulatory Visit (HOSPITAL_COMMUNITY): Payer: Medicare Other | Admitting: Hematology

## 2019-12-23 ENCOUNTER — Ambulatory Visit (HOSPITAL_COMMUNITY): Payer: Medicare Other | Admitting: Hematology

## 2019-12-23 ENCOUNTER — Ambulatory Visit (HOSPITAL_COMMUNITY): Payer: Medicare Other

## 2019-12-23 ENCOUNTER — Other Ambulatory Visit (HOSPITAL_COMMUNITY): Payer: Medicare Other

## 2019-12-31 ENCOUNTER — Inpatient Hospital Stay (HOSPITAL_COMMUNITY): Payer: Medicare Other

## 2019-12-31 ENCOUNTER — Inpatient Hospital Stay (HOSPITAL_BASED_OUTPATIENT_CLINIC_OR_DEPARTMENT_OTHER): Payer: Medicare Other | Admitting: Hematology

## 2019-12-31 ENCOUNTER — Inpatient Hospital Stay (HOSPITAL_COMMUNITY): Payer: Medicare Other | Attending: Hematology

## 2019-12-31 ENCOUNTER — Other Ambulatory Visit: Payer: Self-pay

## 2019-12-31 VITALS — BP 144/76 | HR 77 | Temp 98.1°F | Resp 18 | Wt 197.2 lb

## 2019-12-31 DIAGNOSIS — Z87891 Personal history of nicotine dependence: Secondary | ICD-10-CM | POA: Diagnosis not present

## 2019-12-31 DIAGNOSIS — H409 Unspecified glaucoma: Secondary | ICD-10-CM | POA: Insufficient documentation

## 2019-12-31 DIAGNOSIS — E039 Hypothyroidism, unspecified: Secondary | ICD-10-CM | POA: Insufficient documentation

## 2019-12-31 DIAGNOSIS — I4891 Unspecified atrial fibrillation: Secondary | ICD-10-CM | POA: Diagnosis not present

## 2019-12-31 DIAGNOSIS — Z801 Family history of malignant neoplasm of trachea, bronchus and lung: Secondary | ICD-10-CM | POA: Insufficient documentation

## 2019-12-31 DIAGNOSIS — Z191 Hormone sensitive malignancy status: Secondary | ICD-10-CM

## 2019-12-31 DIAGNOSIS — C61 Malignant neoplasm of prostate: Secondary | ICD-10-CM

## 2019-12-31 DIAGNOSIS — C7951 Secondary malignant neoplasm of bone: Secondary | ICD-10-CM | POA: Insufficient documentation

## 2019-12-31 DIAGNOSIS — R232 Flushing: Secondary | ICD-10-CM | POA: Insufficient documentation

## 2019-12-31 DIAGNOSIS — Z79899 Other long term (current) drug therapy: Secondary | ICD-10-CM | POA: Insufficient documentation

## 2019-12-31 DIAGNOSIS — Z7901 Long term (current) use of anticoagulants: Secondary | ICD-10-CM | POA: Diagnosis not present

## 2019-12-31 DIAGNOSIS — I10 Essential (primary) hypertension: Secondary | ICD-10-CM | POA: Diagnosis not present

## 2019-12-31 DIAGNOSIS — Z85828 Personal history of other malignant neoplasm of skin: Secondary | ICD-10-CM | POA: Insufficient documentation

## 2019-12-31 DIAGNOSIS — Z7952 Long term (current) use of systemic steroids: Secondary | ICD-10-CM | POA: Insufficient documentation

## 2019-12-31 DIAGNOSIS — Z8249 Family history of ischemic heart disease and other diseases of the circulatory system: Secondary | ICD-10-CM | POA: Insufficient documentation

## 2019-12-31 LAB — COMPREHENSIVE METABOLIC PANEL
ALT: 16 U/L (ref 0–44)
AST: 23 U/L (ref 15–41)
Albumin: 4.1 g/dL (ref 3.5–5.0)
Alkaline Phosphatase: 170 U/L — ABNORMAL HIGH (ref 38–126)
Anion gap: 9 (ref 5–15)
BUN: 27 mg/dL — ABNORMAL HIGH (ref 8–23)
CO2: 26 mmol/L (ref 22–32)
Calcium: 8.9 mg/dL (ref 8.9–10.3)
Chloride: 102 mmol/L (ref 98–111)
Creatinine, Ser: 0.83 mg/dL (ref 0.61–1.24)
GFR calc Af Amer: >60 mL/min (ref >60)
GFR calc non Af Amer: >60 mL/min (ref >60)
Glucose, Bld: 101 mg/dL — ABNORMAL HIGH (ref 70–99)
Potassium: 4.1 mmol/L (ref 3.5–5.1)
Sodium: 137 mmol/L (ref 135–145)
Total Bilirubin: 0.4 mg/dL (ref 0.3–1.2)
Total Protein: 7.4 g/dL (ref 6.5–8.1)

## 2019-12-31 LAB — CBC WITH DIFFERENTIAL/PLATELET
Abs Immature Granulocytes: 0.06 10*3/uL (ref 0.00–0.07)
Basophils Absolute: 0 10*3/uL (ref 0.0–0.1)
Basophils Relative: 0 %
Eosinophils Absolute: 0.1 10*3/uL (ref 0.0–0.5)
Eosinophils Relative: 1 %
HCT: 32.1 % — ABNORMAL LOW (ref 39.0–52.0)
Hemoglobin: 9.4 g/dL — ABNORMAL LOW (ref 13.0–17.0)
Immature Granulocytes: 1 %
Lymphocytes Relative: 21 %
Lymphs Abs: 1.7 10*3/uL (ref 0.7–4.0)
MCH: 24.4 pg — ABNORMAL LOW (ref 26.0–34.0)
MCHC: 29.3 g/dL — ABNORMAL LOW (ref 30.0–36.0)
MCV: 83.4 fL (ref 80.0–100.0)
Monocytes Absolute: 0.6 10*3/uL (ref 0.1–1.0)
Monocytes Relative: 7 %
Neutro Abs: 5.9 10*3/uL (ref 1.7–7.7)
Neutrophils Relative %: 70 %
Platelets: 241 10*3/uL (ref 150–400)
RBC: 3.85 MIL/uL — ABNORMAL LOW (ref 4.22–5.81)
RDW: 19.9 % — ABNORMAL HIGH (ref 11.5–15.5)
WBC: 8.4 10*3/uL (ref 4.0–10.5)
nRBC: 0 % (ref 0.0–0.2)

## 2019-12-31 LAB — PSA: Prostatic Specific Antigen: 5.11 ng/mL — ABNORMAL HIGH (ref 0.00–4.00)

## 2019-12-31 MED ORDER — DENOSUMAB 120 MG/1.7ML ~~LOC~~ SOLN
120.0000 mg | Freq: Once | SUBCUTANEOUS | Status: AC
  Administered 2019-12-31: 120 mg via SUBCUTANEOUS
  Filled 2019-12-31: qty 1.7

## 2019-12-31 MED ORDER — LEUPROLIDE ACETATE (6 MONTH) 45 MG ~~LOC~~ KIT
45.0000 mg | PACK | Freq: Once | SUBCUTANEOUS | Status: AC
  Administered 2019-12-31: 45 mg via SUBCUTANEOUS
  Filled 2019-12-31: qty 45

## 2019-12-31 NOTE — Progress Notes (Signed)
[facility] [address] [place] [address]   CLINIC:  Medical Oncology/Hematology  PCP:  [name], MD [place] / [address] [id number] [phone]   REASON FOR VISIT:  Follow-up for metastatic prostate cancer.  PRIOR THERAPY: None  NGS Results: Test pending  CURRENT THERAPY: Lupron, abiraterone with prednisone.  BRIEF ONCOLOGIC HISTORY:  Oncology History   No history exists.    CANCER STAGING: Cancer Staging No matching staging information was found for the patient.  INTERVAL HISTORY:  Steven Gutierrez, a 86 y.o. male, returns for routine follow-up of his metastatic prostate cancer. Tami was last seen on 11/24/2019.  Today he reports feeling well. He started taking his pills, approx 3 weeks ago. He reports having to get up at night multiple times to urinate, and he reports hot flashes at night. He reports no issues with his tablets or with his injections. His energy levels are satisfactory. He has no issues with his appetite.   REVIEW OF SYSTEMS:  Review of Systems  Constitutional: Positive for fatigue (mild). Negative for appetite change.  Cardiovascular: Negative for leg swelling.  Genitourinary: Positive for nocturia.   Neurological: Positive for dizziness.  All other systems reviewed and are negative.   PAST MEDICAL/SURGICAL HISTORY:  Past Medical History:  Diagnosis Date  . A-fib ([place])   . Cancer ([place])    skin cancer  . Glaucoma 11/17/2019  . Gout 11/17/2019  . Hypertension   . Hypothyroidism 11/17/2019   Past Surgical History:  Procedure Laterality Date  . ESOPHAGOGASTRODUODENOSCOPY  03/2018  . TRANSURETHRAL RESECTION OF PROSTATE  04/21/2019    SOCIAL HISTORY:  Social History   Socioeconomic History  . Marital status: Widowed    Spouse name: Not on file  . Number of children: 2  . Years of education: Not on file  . Highest education level: Not on file  Occupational History  . Occupation: Retired    Tobacco Use  . Smoking status: Former Smoker    Packs/day: 0.50    Types: Cigarettes  . Smokeless tobacco: Never Used  Substance and Sexual Activity  . Alcohol use: Yes    Alcohol/week: 1.0 standard drinks    Types: 1 Glasses of wine per week    Comment: occ  . Drug use: Never  . Sexual activity: Not Currently  Other Topics Concern  . Not on file  Social History Narrative  . Not on file   Social Determinants of Health   Financial Resource Strain: Low Risk   . Difficulty of Paying Living Expenses: Not hard at all  Food Insecurity: No Food Insecurity  . Worried About Charity fundraiser in the Last Year: Never true  . Ran Out of Food in the Last Year: Never true  Transportation Needs: No Transportation Needs  . Lack of Transportation (Medical): No  . Lack of Transportation (Non-Medical): No  Physical Activity: Insufficiently Active  . Days of Exercise per Week: 3 days  . Minutes of Exercise per Session: 30 min  Stress: No Stress Concern Present  . Feeling of Stress : Not at all  Social Connections: Somewhat Isolated  . Frequency of Communication with Friends and Family: More than three times a week  . Frequency of Social Gatherings with Friends and Family: More than three times a week  . Attends Religious Services: More than 4 times per year  . Active Member of Clubs or Organizations: No  . Attends Archivist Meetings: Never  .  Marital Status: Widowed  Intimate Partner Violence: Not At Risk  . Fear of Current or Ex-Partner: No  . Emotionally Abused: No  . Physically Abused: No  . Sexually Abused: No    FAMILY HISTORY:  Family History  Problem Relation Age of Onset  . Heart attack Mother   . Stroke Father   . Heart attack Sister   . Lung cancer Brother     CURRENT MEDICATIONS:  Current Outpatient Medications  Medication Sig Dispense Refill  . abiraterone acetate (ZYTIGA) 250 MG tablet Take 4 tablets (1,000 mg total) by mouth daily. Take on an empty  stomach 1 hour before or 2 hours after a meal 120 tablet 0  . acetaminophen (TYLENOL) 500 MG tablet Take 1,000 mg by mouth daily as needed for fever (for pain.).     Marland Kitchen allopurinol (ZYLOPRIM) 300 MG tablet Take 300 mg by mouth daily.    Marland Kitchen amLODipine (NORVASC) 5 MG tablet Take 1 tablet (5 mg total) by mouth daily. 30 tablet 3  . docusate sodium (PHILLIPS STOOL SOFTENER) 100 MG capsule Take 100 mg by mouth daily as needed for mild constipation.    Marland Kitchen HYDROcodone-acetaminophen (NORCO/VICODIN) 5-325 MG tablet Take 2 tablets by mouth at bedtime as needed. (Patient not taking: Reported on 11/24/2019) 60 tablet 0  . levothyroxine (SYNTHROID) 50 MCG tablet Take 50 mcg by mouth daily before breakfast.    . lidocaine (LIDODERM) 5 % Place 1 patch onto the skin daily. Remove & Discard patch within 12 hours or as directed by MD (Patient not taking: Reported on 11/24/2019) 30 patch 0  . Multiple Vitamin (MULTIVITAMIN WITH MINERALS) TABS tablet Take 1 tablet by mouth daily.    . ondansetron (ZOFRAN ODT) 4 MG disintegrating tablet Take 1 tablet (4 mg total) by mouth every 8 (eight) hours as needed for nausea or vomiting. (Patient not taking: Reported on 11/24/2019) 20 tablet 0  . pantoprazole (PROTONIX) 40 MG tablet Take 40 mg by mouth daily.     . predniSONE (DELTASONE) 5 MG tablet Take 1 tablet (5 mg total) by mouth daily with breakfast. 30 tablet 6  . rivaroxaban (XARELTO) 20 MG TABS tablet Take 20 mg by mouth in the morning.     . timolol (BETIMOL) 0.5 % ophthalmic solution Place 1 drop into the left eye in the morning.     No current facility-administered medications for this visit.    ALLERGIES:  Allergies  Allergen Reactions  . Oxycodone Nausea And Vomiting    PHYSICAL EXAM:  Performance status (ECOG): 1 - Symptomatic but completely ambulatory  There were no vitals filed for this visit. Wt Readings from Last 3 Encounters:  11/24/19 186 lb 11.2 oz (84.7 kg)  11/17/19 187 lb (84.8 kg)  11/12/19 186 lb  6.4 oz (84.6 kg)   Physical Exam Vitals reviewed.  Constitutional:      Appearance: Normal appearance.  Cardiovascular:     Rate and Rhythm: Normal rate and regular rhythm.     Pulses: Normal pulses.     Heart sounds: Normal heart sounds.  Pulmonary:     Effort: Pulmonary effort is normal.     Breath sounds: Normal breath sounds.  Abdominal:     Palpations: Abdomen is soft. There is no mass.     Tenderness: There is no abdominal tenderness.  Musculoskeletal:     Right lower leg: No edema.     Left lower leg: No edema.  Neurological:     General: No focal deficit  present.     Mental Status: He is alert and oriented to person, place, and time.  Psychiatric:        Mood and Affect: Mood normal.        Behavior: Behavior normal.      LABORATORY DATA:  I have reviewed the labs as listed.  CBC Latest Ref Rng & Units 12/31/2019 11/18/2019 11/17/2019  WBC 4.0 - 10.5 K/uL 8.4 9.8 13.7(H)  Hemoglobin 13.0 - 17.0 g/dL 9.4(L) 8.8(L) 10.3(L)  Hematocrit 39.0 - 52.0 % 32.1(L) 29.8(L) 34.3(L)  Platelets 150 - 400 K/uL 241 328 331   CMP Latest Ref Rng & Units 11/18/2019 11/17/2019 10/21/2019  Glucose 70 - 99 mg/dL 99 119(H) 106(H)  BUN 8 - 23 mg/dL 17 19 19   Creatinine 0.61 - 1.24 mg/dL 0.85 0.88 1.01  Sodium 135 - 145 mmol/L 136 132(L) 132(L)  Potassium 3.5 - 5.1 mmol/L 4.1 3.9 3.7  Chloride 98 - 111 mmol/L 101 97(L) 97(L)  CO2 22 - 32 mmol/L 26 23 23   Calcium 8.9 - 10.3 mg/dL 8.7(L) 8.7(L) 8.7(L)  Total Protein 6.5 - 8.1 g/dL - 7.7 -  Total Bilirubin 0.3 - 1.2 mg/dL - 0.6 -  Alkaline Phos 38 - 126 U/L - 174(H) -  AST 15 - 41 U/L - 31 -  ALT 0 - 44 U/L - 20 -    DIAGNOSTIC IMAGING:  I have independently reviewed the scans and discussed with the patient.   ASSESSMENT:  1.  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 Titus Mould in [place] around September 2020 with prostate cancer. -Presentation with low back pain to the ER on 10/21/2019  with MRI of the lumbar spine showing bone metastasis involving L2, T12 vertebral bodies with no extraosseous tumor.  Extensive bulky retroperitoneal adenopathy. -PSA was 390.  PET scan on 11/10/2019 showed nodal metastasis involving chest, abdomen and pelvis.  Bone metastasis including destructive soft tissue mass involving right posterior column of the acetabulum. -Left external iliac lymph node biopsy on 11/18/2019 consistent with prostatic adenocarcinoma. -Firmagon started on 11/24/2019.  Abiraterone with prednisone started around 12/10/2019.   PLAN:  1.  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  So far he has tolerated very well. -He reports hot flashes up to 5 times per day and night.  Urinary frequency has been stable. -I reviewed his LFTs which are normal.  Potassium is 4.1.  Blood pressure is 144/76. -We will increase Abiraterone to 750 mg daily.  Continue prednisone 5 mg daily.  We will reevaluate him in 4 to 5 weeks.  2.  Low back pain: -This has completely resolved after we started him on Firmagon and Abiraterone.  3.  Atrial fibrillation: -Continue Xarelto.  No bleeding issues.  4.  Bone metastasis: -Continue monthly Xgeva.  Continue calcium supplements twice daily.    Orders placed this encounter:  No orders of the defined types were placed in this encounter.    Derek Jack, MD [facility] [phone]   I, Jacqualyn Posey, am acting as a scribe for Dr. Sanda Linger.  I, Derek Jack MD, have reviewed the above documentation for accuracy and completeness, and I agree with the above.

## 2019-12-31 NOTE — Progress Notes (Signed)
Morgan reviewed with and pt seen by Dr. Delton Coombes and pt approved for Delton See and Eligard injections today per MD                Keturah Shavers tolerated Delton See and Eligard injections well without complaints or incident. Calcium 8.9 today and pt denied any tooth or jaw pain and no recent or future dental visits prior to administering the Xgeva injection. Pt discharged self ambulatory in satisfactory condition

## 2019-12-31 NOTE — Patient Instructions (Addendum)
[place] at [facility] Discharge Instructions  Received Delton See and Eligard injections today. Follow-up as scheduled   Thank you for choosing [place] at [facility] to provide your oncology and hematology care.  To afford each patient quality time with our provider, please arrive at least 15 minutes before your scheduled appointment time.   If you have a lab appointment with the [facility] please come in thru the Main Entrance and check in at the main information desk.  You need to re-schedule your appointment should you arrive 10 or more minutes late.  We strive to give you quality time with our providers, and arriving late affects you and other patients whose appointments are after yours.  Also, if you no show three or more times for appointments you may be dismissed from the clinic at the providers discretion.     Again, thank you for choosing [facility].  Our hope is that these requests will decrease the amount of time that you wait before being seen by our physicians.       _____________________________________________________________  Should you have questions after your visit to [facility], please contact our office at [phone] between the hours of 8:00 a.m. and 4:30 p.m.  Voicemails left after 4:00 p.m. will not be returned until the following business day.  For prescription refill requests, have your pharmacy contact our office and allow 72 hours.    Due to Covid, you will need to wear a mask upon entering the hospital. If you do not have a mask, a mask will be given to you at the Main Entrance upon arrival. For doctor visits, patients may have 1 support person with them. For treatment visits, patients can not have anyone with them due to social distancing guidelines and our immunocompromised population.

## 2019-12-31 NOTE — Patient Instructions (Signed)
[place] at [facility] Discharge Instructions  You were seen today by Dr. Delton Coombes. He went over your recent results. You got your injections today. Dr. Delton Coombes will see you back in 5 weeks for labs and follow up.   Thank you for choosing [place] at [facility] to provide your oncology and hematology care.  To afford each patient quality time with our provider, please arrive at least 15 minutes before your scheduled appointment time.   If you have a lab appointment with the [facility] please come in thru the  Main Entrance and check in at the main information desk  You need to re-schedule your appointment should you arrive 10 or more minutes late.  We strive to give you quality time with our providers, and arriving late affects you and other patients whose appointments are after yours.  Also, if you no show three or more times for appointments you may be dismissed from the clinic at the providers discretion.     Again, thank you for choosing [facility].  Our hope is that these requests will decrease the amount of time that you wait before being seen by our physicians.       _____________________________________________________________  Should you have questions after your visit to [facility], please contact our office at [phone] between the hours of 8:00 a.m. and 4:30 p.m.  Voicemails left after 4:00 p.m. will not be returned until the following business day.  For prescription refill requests, have your pharmacy contact our office and allow 72 hours.    Cancer Center Support Programs:   > Cancer Support Group  2nd Tuesday of the month 1pm-2pm, Journey Room

## 2020-01-01 LAB — CEA: CEA: 4.6 ng/mL (ref 0.0–4.7)

## 2020-01-18 ENCOUNTER — Other Ambulatory Visit (HOSPITAL_COMMUNITY): Payer: Self-pay | Admitting: *Deleted

## 2020-01-18 DIAGNOSIS — Z191 Hormone sensitive malignancy status: Secondary | ICD-10-CM

## 2020-01-18 DIAGNOSIS — C61 Malignant neoplasm of prostate: Secondary | ICD-10-CM

## 2020-01-18 MED ORDER — ABIRATERONE ACETATE 250 MG PO TABS: 1000.0000 mg | ORAL_TABLET | Freq: Every day | ORAL | 2 refills | Status: DC

## 2020-01-19 MED FILL — ABIRATERONE ACETATE 250 MG: 250 | 30 days supply | Qty: 120 | Fill #0

## 2020-01-29 ENCOUNTER — Inpatient Hospital Stay (HOSPITAL_COMMUNITY): Payer: Medicare Other | Attending: Hematology

## 2020-01-29 ENCOUNTER — Other Ambulatory Visit: Payer: Self-pay

## 2020-01-29 ENCOUNTER — Inpatient Hospital Stay (HOSPITAL_BASED_OUTPATIENT_CLINIC_OR_DEPARTMENT_OTHER): Payer: Medicare Other | Admitting: Oncology

## 2020-01-29 ENCOUNTER — Inpatient Hospital Stay (HOSPITAL_COMMUNITY): Payer: Medicare Other

## 2020-01-29 ENCOUNTER — Encounter (HOSPITAL_COMMUNITY): Payer: Self-pay | Admitting: Oncology

## 2020-01-29 VITALS — BP 144/53 | HR 67 | Temp 97.1°F | Resp 18 | Wt 203.0 lb

## 2020-01-29 DIAGNOSIS — C7951 Secondary malignant neoplasm of bone: Secondary | ICD-10-CM

## 2020-01-29 DIAGNOSIS — Z191 Hormone sensitive malignancy status: Secondary | ICD-10-CM

## 2020-01-29 DIAGNOSIS — Z79899 Other long term (current) drug therapy: Secondary | ICD-10-CM | POA: Insufficient documentation

## 2020-01-29 DIAGNOSIS — E039 Hypothyroidism, unspecified: Secondary | ICD-10-CM | POA: Insufficient documentation

## 2020-01-29 DIAGNOSIS — I10 Essential (primary) hypertension: Secondary | ICD-10-CM | POA: Diagnosis not present

## 2020-01-29 DIAGNOSIS — Z85828 Personal history of other malignant neoplasm of skin: Secondary | ICD-10-CM | POA: Diagnosis not present

## 2020-01-29 DIAGNOSIS — C61 Malignant neoplasm of prostate: Secondary | ICD-10-CM | POA: Insufficient documentation

## 2020-01-29 DIAGNOSIS — C772 Secondary and unspecified malignant neoplasm of intra-abdominal lymph nodes: Secondary | ICD-10-CM | POA: Insufficient documentation

## 2020-01-29 DIAGNOSIS — I4891 Unspecified atrial fibrillation: Secondary | ICD-10-CM | POA: Insufficient documentation

## 2020-01-29 DIAGNOSIS — D649 Anemia, unspecified: Secondary | ICD-10-CM | POA: Insufficient documentation

## 2020-01-29 LAB — COMPREHENSIVE METABOLIC PANEL
ALT: 15 U/L (ref 0–44)
AST: 24 U/L (ref 15–41)
Albumin: 4.3 g/dL (ref 3.5–5.0)
Alkaline Phosphatase: 111 U/L (ref 38–126)
Anion gap: 8 (ref 5–15)
BUN: 23 mg/dL (ref 8–23)
CO2: 26 mmol/L (ref 22–32)
Calcium: 8.9 mg/dL (ref 8.9–10.3)
Chloride: 104 mmol/L (ref 98–111)
Creatinine, Ser: 0.89 mg/dL (ref 0.61–1.24)
GFR calc Af Amer: >60 mL/min (ref >60)
GFR calc non Af Amer: >60 mL/min (ref >60)
Glucose, Bld: 106 mg/dL — ABNORMAL HIGH (ref 70–99)
Potassium: 4.5 mmol/L (ref 3.5–5.1)
Sodium: 138 mmol/L (ref 135–145)
Total Bilirubin: 0.5 mg/dL (ref 0.3–1.2)
Total Protein: 7.4 g/dL (ref 6.5–8.1)

## 2020-01-29 LAB — CBC WITH DIFFERENTIAL/PLATELET
Abs Immature Granulocytes: 0.04 10*3/uL (ref 0.00–0.07)
Basophils Absolute: 0 10*3/uL (ref 0.0–0.1)
Basophils Relative: 0 %
Eosinophils Absolute: 0.1 10*3/uL (ref 0.0–0.5)
Eosinophils Relative: 1 %
HCT: 31.1 % — ABNORMAL LOW (ref 39.0–52.0)
Hemoglobin: 8.9 g/dL — ABNORMAL LOW (ref 13.0–17.0)
Immature Granulocytes: 1 %
Lymphocytes Relative: 18 %
Lymphs Abs: 1.3 10*3/uL (ref 0.7–4.0)
MCH: 24.3 pg — ABNORMAL LOW (ref 26.0–34.0)
MCHC: 28.6 g/dL — ABNORMAL LOW (ref 30.0–36.0)
MCV: 85 fL (ref 80.0–100.0)
Monocytes Absolute: 0.6 10*3/uL (ref 0.1–1.0)
Monocytes Relative: 9 %
Neutro Abs: 5.2 10*3/uL (ref 1.7–7.7)
Neutrophils Relative %: 71 %
Platelets: 215 10*3/uL (ref 150–400)
RBC: 3.66 MIL/uL — ABNORMAL LOW (ref 4.22–5.81)
RDW: 20.2 % — ABNORMAL HIGH (ref 11.5–15.5)
WBC: 7.2 10*3/uL (ref 4.0–10.5)
nRBC: 0 % (ref 0.0–0.2)

## 2020-01-29 LAB — PSA: Prostatic Specific Antigen: 0.95 ng/mL (ref 0.00–4.00)

## 2020-01-29 MED ORDER — DENOSUMAB 120 MG/1.7ML ~~LOC~~ SOLN
120.0000 mg | Freq: Once | SUBCUTANEOUS | Status: AC
  Administered 2020-01-29: 120 mg via SUBCUTANEOUS

## 2020-01-29 MED ORDER — DENOSUMAB 120 MG/1.7ML ~~LOC~~ SOLN
SUBCUTANEOUS | Status: AC
  Filled 2020-01-29: qty 1.7

## 2020-01-29 NOTE — Patient Instructions (Signed)
Labs updated today.  Blood counts are stable.  PSA is in process and we will have that result next week. Xgeva injection today for your bones. Continue Prednisone + Zytiga as prescribed at home. I recommend you proceed with your COVID-19 immunization #2 today, this weekend, or Monday or Tuesday. Return in 4 weeks for follow-up, labs, and injection.

## 2020-01-29 NOTE — Progress Notes (Signed)
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 Titus Mould in [place] around September 2020 with prostate cancer. -Presentation with low back pain to the ER on 10/21/2019 with MRI of the lumbar spine showing bone metastasis involving L2, T12 vertebral bodies with no extraosseous tumor.  Extensive bulky retroperitoneal adenopathy. -PSA was 390.  PET scan on 11/10/2019 showed nodal metastasis involving chest, abdomen and pelvis.  Bone metastasis including destructive soft tissue mass involving right posterior column of the acetabulum. -Left external iliac lymph node biopsy on 11/18/2019 consistent with prostatic adenocarcinoma. -Firmagon started on 11/24/2019.  Abiraterone with prednisone started around 12/10/2019. -On Lupron every 6 months  CURRENT STATUS: Steven Gutierrez 86 y.o. male returns for followup of in follow-up of metastatic prostate cancer to bone currently on therapy with bone targeted therapy with Delton See, Depo-Lupron every 6 months, abiraterone + prednisone.  He is tolerating therapy well.  His PSA has come down and PSA today is in process.  Most recent PSA is 5 compared to 130.  He denies any new lumps or bumps on his examination.  No new pain.  No cough or hemoptysis.  He denies shortness of breath.  No abdominal discomfort or bloating.  No new neurological deficits including headaches, dizziness, double vision, LOC, seizure.  He was due for COVID-19 immunization #2 yesterday and he rescheduled for 2 weeks from now because he was fearful that it may interfere with his prostate cancer treatment.  I have educated him that it will not and I encouraged him to get his  COVID-19 immunization today, tomorrow, over the weekend, Monday, or Tuesday.  Otherwise he may miss his 3-week window of opportunity to receive immunization #2.  Review of Systems  Constitutional: Negative.  Negative for chills, fever and weight loss.  HENT: Negative.   Eyes: Negative.   Respiratory: Negative.  Negative for cough.   Cardiovascular: Negative.  Negative for chest pain.  Gastrointestinal: Negative.  Negative for blood in stool, constipation, diarrhea, melena, nausea and vomiting.  Genitourinary: Negative.        Nocturia  Musculoskeletal: Negative.   Skin: Negative.   Neurological: Negative.  Negative for weakness.  Endo/Heme/Allergies: Negative.   Psychiatric/Behavioral: Negative.     Past Medical History:  Diagnosis Date  . A-fib ([place])   . Cancer ([place])    skin cancer  . Glaucoma 11/17/2019  . Gout 11/17/2019  . Hypertension   . Hypothyroidism 11/17/2019     PHYSICAL EXAMINATION  ECOG PERFORMANCE STATUS: 1 - Symptomatic but completely ambulatory  Vitals:   01/29/20 1125  BP: (!) 144/53  Pulse: 67  Resp: 18  Temp: (!) 97.1 F (36.2 C)  SpO2: 100%    GENERAL:alert, no distress, well nourished, well developed, comfortable, cooperative, smiling and unaccompanied SKIN: skin color, texture, turgor are normal, no rashes or significant lesions HEAD: Normocephalic, No masses, lesions, tenderness or abnormalities EYES: normal, EOMI EARS: External ears normal OROPHARYNX: Not examined, mask in place NECK: supple LYMPH:  no palpable lymphadenopathy BREAST:not examined LUNGS: clear to auscultation and percussion HEART: regular rate & rhythm, no murmurs and no gallops ABDOMEN:abdomen soft and normal bowel sounds BACK: Back symmetric, no curvature. EXTREMITIES:less then 2 second capillary refill, no joint  deformities, effusion, or inflammation, no edema, no skin discoloration, no clubbing, no cyanosis  NEURO: alert & oriented x 3 with fluent speech, no focal  motor/sensory deficits, gait normal   LABORATORY DATA: CBC    Component Value Date/Time   WBC 7.2 01/29/2020 1011   RBC 3.66 (L) 01/29/2020 1011   HGB 8.9 (L) 01/29/2020 1011   HCT 31.1 (L) 01/29/2020 1011   PLT 215 01/29/2020 1011   MCV 85.0 01/29/2020 1011   MCH 24.3 (L) 01/29/2020 1011   MCHC 28.6 (L) 01/29/2020 1011   RDW 20.2 (H) 01/29/2020 1011   LYMPHSABS 1.3 01/29/2020 1011   MONOABS 0.6 01/29/2020 1011   EOSABS 0.1 01/29/2020 1011   BASOSABS 0.0 01/29/2020 1011      Chemistry      Component Value Date/Time   NA 138 01/29/2020 1011   K 4.5 01/29/2020 1011   CL 104 01/29/2020 1011   CO2 26 01/29/2020 1011   BUN 23 01/29/2020 1011   CREATININE 0.89 01/29/2020 1011      Component Value Date/Time   CALCIUM 8.9 01/29/2020 1011   ALKPHOS 111 01/29/2020 1011   AST 24 01/29/2020 1011   ALT 15 01/29/2020 1011   BILITOT 0.5 01/29/2020 1011       RADIOGRAPHIC STUDIES:  No results found.     ASSESSMENT AND PLAN:  1. 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 Titus Mould in [place] around September 2020 with prostate cancer. -Presentation with low back pain to the ER on 10/21/2019 with MRI of the lumbar spine showing bone metastasis involving L2, T12 vertebral bodies with no extraosseous tumor.  Extensive bulky retroperitoneal adenopathy. -PSA was 390.  PET scan on 11/10/2019 showed nodal metastasis involving chest, abdomen and pelvis.  Bone metastasis including destructive soft tissue mass involving right posterior column of the acetabulum. -Left external iliac lymph node biopsy on 11/18/2019 consistent with prostatic adenocarcinoma. -Firmagon started on 11/24/2019.  Abiraterone with prednisone started around 12/10/2019. -On Lupron every 6 months  Labs today: WBC 7.2 with normal differential, hemoglobin 8.9 g/dL, platelet count 215,000.  Anemia is normocytic  and hypochromic.  Metabolic panel is unimpressive.  PSA is in process.  He is asymptomatic from an anemia standpoint.  Renal function is normal.  He denies any blood in stools or black stools.  No gross hematuria or hemoptysis.  I wonder if this is anemia secondary to myelophthisis since he has bone involvement of cancer.  Delton See today as planned.  Continue Abiraterone + Prednisone daily as prescribed.  Next Lupron injection is due in 05/2020.  Nursing, in accordance with chemotherapy administration protocol, will monitor for acute side effects/toxicities associated with chemotherapy administration today.  He complains of nocturia and I will defer this management to his urologist.  Return in 4 weeks for follow-up and ongoing treatment.  2. Bone metastasis ([place]) On bone targeted therapy with Xgeva monthly.  3. Essential hypertension Blood pressure today is 144/53.  Systolic pressures above goal.  Will defer management to primary care provider.   ORDERS PLACED FOR THIS ENCOUNTER: Orders Placed This Encounter  Procedures  . CBC with Differential  . Comprehensive metabolic panel  . PSA  . Testosterone    MEDICATIONS PRESCRIBED THIS ENCOUNTER: No orders of the defined types were placed in this encounter.   All questions were answered. The patient knows to call the clinic with any problems, questions or concerns. We can certainly see  the patient much sooner if necessary.  Patient and plan discussed with Dr. Derek Jack and he is in agreement with the aforementioned.   This note is electronically signed by: Robynn Pane, PA-C 01/29/2020 12:47 PM

## 2020-01-29 NOTE — Progress Notes (Signed)
Patient seen by [facility] PA. Vital signs are stable.  Verbal order received from PA. May give XGeva now. Calcium 8.9.   Steven Gutierrez presents today for injection per MD orders. XGeva administered SQ in right Upper Arm. Administration without incident. Patient tolerated well. Vital signs stable. No complaints at this time. Discharged from clinic ambulatory. F/U with [facility] as scheduled.

## 2020-01-29 NOTE — Patient Instructions (Signed)
[facility] at [facility]  Discharge Instructions:   _______________________________________________________________  Thank you for choosing [facility] at [facility] to provide your oncology and hematology care.  To afford each patient quality time with our providers, please arrive at least 15 minutes before your scheduled appointment.  You need to re-schedule your appointment if you arrive 10 or more minutes late.  We strive to give you quality time with our providers, and arriving late affects you and other patients whose appointments are after yours.  Also, if you no show three or more times for appointments you may be dismissed from the clinic.  Again, thank you for choosing [facility] at [facility]. Our hope is that these requests will allow you access to exceptional care and in a timely manner. _______________________________________________________________  If you have questions after your visit, please contact our office at [phone] between the hours of 8:30 a.m. and 5:00 p.m. Voicemails left after 4:30 p.m. will not be returned until the following business day. _______________________________________________________________  For prescription refill requests, have your pharmacy contact our office. _______________________________________________________________  Recommendations made by the consultant and any test results will be sent to your referring physician. _______________________________________________________________ 

## 2020-02-25 MED FILL — ABIRATERONE ACETATE 250 MG: 250 | 30 days supply | Qty: 120 | Fill #1

## 2020-02-26 ENCOUNTER — Ambulatory Visit (HOSPITAL_COMMUNITY): Payer: Medicare Other | Admitting: Nurse Practitioner

## 2020-02-26 ENCOUNTER — Ambulatory Visit (HOSPITAL_COMMUNITY): Payer: Medicare Other

## 2020-02-26 ENCOUNTER — Other Ambulatory Visit (HOSPITAL_COMMUNITY): Payer: Medicare Other

## 2020-03-01 ENCOUNTER — Other Ambulatory Visit: Payer: Self-pay

## 2020-03-01 ENCOUNTER — Inpatient Hospital Stay (HOSPITAL_COMMUNITY): Payer: Medicare Other | Attending: Hematology

## 2020-03-01 ENCOUNTER — Inpatient Hospital Stay (HOSPITAL_BASED_OUTPATIENT_CLINIC_OR_DEPARTMENT_OTHER): Payer: Medicare Other | Admitting: Nurse Practitioner

## 2020-03-01 ENCOUNTER — Inpatient Hospital Stay (HOSPITAL_COMMUNITY): Payer: Medicare Other

## 2020-03-01 DIAGNOSIS — H409 Unspecified glaucoma: Secondary | ICD-10-CM | POA: Insufficient documentation

## 2020-03-01 DIAGNOSIS — E039 Hypothyroidism, unspecified: Secondary | ICD-10-CM | POA: Diagnosis not present

## 2020-03-01 DIAGNOSIS — Z8249 Family history of ischemic heart disease and other diseases of the circulatory system: Secondary | ICD-10-CM | POA: Diagnosis not present

## 2020-03-01 DIAGNOSIS — Z801 Family history of malignant neoplasm of trachea, bronchus and lung: Secondary | ICD-10-CM | POA: Insufficient documentation

## 2020-03-01 DIAGNOSIS — C7951 Secondary malignant neoplasm of bone: Secondary | ICD-10-CM | POA: Insufficient documentation

## 2020-03-01 DIAGNOSIS — Z87891 Personal history of nicotine dependence: Secondary | ICD-10-CM | POA: Insufficient documentation

## 2020-03-01 DIAGNOSIS — Z191 Hormone sensitive malignancy status: Secondary | ICD-10-CM

## 2020-03-01 DIAGNOSIS — Z7901 Long term (current) use of anticoagulants: Secondary | ICD-10-CM | POA: Insufficient documentation

## 2020-03-01 DIAGNOSIS — I10 Essential (primary) hypertension: Secondary | ICD-10-CM | POA: Diagnosis not present

## 2020-03-01 DIAGNOSIS — I4891 Unspecified atrial fibrillation: Secondary | ICD-10-CM | POA: Insufficient documentation

## 2020-03-01 DIAGNOSIS — Z7952 Long term (current) use of systemic steroids: Secondary | ICD-10-CM | POA: Diagnosis not present

## 2020-03-01 DIAGNOSIS — Z85828 Personal history of other malignant neoplasm of skin: Secondary | ICD-10-CM | POA: Insufficient documentation

## 2020-03-01 DIAGNOSIS — Z79899 Other long term (current) drug therapy: Secondary | ICD-10-CM | POA: Insufficient documentation

## 2020-03-01 DIAGNOSIS — C61 Malignant neoplasm of prostate: Secondary | ICD-10-CM

## 2020-03-01 DIAGNOSIS — D649 Anemia, unspecified: Secondary | ICD-10-CM | POA: Insufficient documentation

## 2020-03-01 LAB — CBC WITH DIFFERENTIAL/PLATELET
Abs Immature Granulocytes: 0.07 10*3/uL (ref 0.00–0.07)
Basophils Absolute: 0 10*3/uL (ref 0.0–0.1)
Basophils Relative: 0 %
Eosinophils Absolute: 0.2 10*3/uL (ref 0.0–0.5)
Eosinophils Relative: 2 %
HCT: 30.7 % — ABNORMAL LOW (ref 39.0–52.0)
Hemoglobin: 8.8 g/dL — ABNORMAL LOW (ref 13.0–17.0)
Immature Granulocytes: 1 %
Lymphocytes Relative: 19 %
Lymphs Abs: 1.6 10*3/uL (ref 0.7–4.0)
MCH: 24.5 pg — ABNORMAL LOW (ref 26.0–34.0)
MCHC: 28.7 g/dL — ABNORMAL LOW (ref 30.0–36.0)
MCV: 85.5 fL (ref 80.0–100.0)
Monocytes Absolute: 0.8 10*3/uL (ref 0.1–1.0)
Monocytes Relative: 10 %
Neutro Abs: 5.7 10*3/uL (ref 1.7–7.7)
Neutrophils Relative %: 68 %
Platelets: 191 10*3/uL (ref 150–400)
RBC: 3.59 MIL/uL — ABNORMAL LOW (ref 4.22–5.81)
RDW: 18.8 % — ABNORMAL HIGH (ref 11.5–15.5)
WBC: 8.4 10*3/uL (ref 4.0–10.5)
nRBC: 0 % (ref 0.0–0.2)

## 2020-03-01 LAB — COMPREHENSIVE METABOLIC PANEL
ALT: 14 U/L (ref 0–44)
AST: 21 U/L (ref 15–41)
Albumin: 4.3 g/dL (ref 3.5–5.0)
Alkaline Phosphatase: 84 U/L (ref 38–126)
Anion gap: 9 (ref 5–15)
BUN: 27 mg/dL — ABNORMAL HIGH (ref 8–23)
CO2: 25 mmol/L (ref 22–32)
Calcium: 8.7 mg/dL — ABNORMAL LOW (ref 8.9–10.3)
Chloride: 105 mmol/L (ref 98–111)
Creatinine, Ser: 0.95 mg/dL (ref 0.61–1.24)
GFR calc Af Amer: >60 mL/min (ref >60)
GFR calc non Af Amer: >60 mL/min (ref >60)
Glucose, Bld: 96 mg/dL (ref 70–99)
Potassium: 3.8 mmol/L (ref 3.5–5.1)
Sodium: 139 mmol/L (ref 135–145)
Total Bilirubin: 0.5 mg/dL (ref 0.3–1.2)
Total Protein: 7.4 g/dL (ref 6.5–8.1)

## 2020-03-01 LAB — PSA: Prostatic Specific Antigen: 0.3 ng/mL (ref 0.00–4.00)

## 2020-03-01 MED ORDER — DENOSUMAB 120 MG/1.7ML ~~LOC~~ SOLN
120.0000 mg | Freq: Once | SUBCUTANEOUS | Status: AC
  Administered 2020-03-01: 120 mg via SUBCUTANEOUS
  Filled 2020-03-01: qty 1.7

## 2020-03-01 NOTE — Progress Notes (Signed)
[place], [place] [address]   CLINIC:  Medical Oncology/Hematology  PCP:  [name], MD [place] [address] [phone]   REASON FOR VISIT: Follow-up for prostate cancer   CURRENT THERAPY: Zytiga and prednisone   INTERVAL HISTORY:  Steven Gutierrez 86 y.o. male returns for routine follow-up for prostate cancer.  Patient reports he is doing well since his last visit.  He denies any new issues. Denies any nausea, vomiting, or diarrhea. Denies any new pains. Had not noticed any recent bleeding such as epistaxis, hematuria or hematochezia. Denies recent chest pain on exertion, shortness of breath on minimal exertion, pre-syncopal episodes, or palpitations. Denies any numbness or tingling in hands or feet. Denies any recent fevers, infections, or recent hospitalizations. Patient reports appetite at 100% and energy level at 75%.  He is eating well maintain his weight this time.     REVIEW OF SYSTEMS:  Review of Systems  Neurological: Positive for dizziness.  All other systems reviewed and are negative.    PAST MEDICAL/SURGICAL HISTORY:  Past Medical History:  Diagnosis Date  . A-fib ([place])   . Cancer ([place])    skin cancer  . Glaucoma 11/17/2019  . Gout 11/17/2019  . Hypertension   . Hypothyroidism 11/17/2019   Past Surgical History:  Procedure Laterality Date  . ESOPHAGOGASTRODUODENOSCOPY  03/2018  . TRANSURETHRAL RESECTION OF PROSTATE  04/21/2019     SOCIAL HISTORY:  Social History   Socioeconomic History  . Marital status: Widowed    Spouse name: Not on file  . Number of children: 2  . Years of education: Not on file  . Highest education level: Not on file  Occupational History  . Occupation: Retired  Tobacco Use  . Smoking status: Former Smoker    Packs/day: 0.50    Types: Cigarettes  . Smokeless tobacco: Never Used  Vaping Use  . Vaping Use: Never used  Substance and Sexual Activity  . Alcohol use: Yes      Alcohol/week: 1.0 standard drink    Types: 1 Glasses of wine per week    Comment: occ  . Drug use: Never  . Sexual activity: Not Currently  Other Topics Concern  . Not on file  Social History Narrative  . Not on file   Social Determinants of Health   Financial Resource Strain: Low Risk   . Difficulty of Paying Living Expenses: Not hard at all  Food Insecurity: No Food Insecurity  . Worried About Charity fundraiser in the Last Year: Never true  . Ran Out of Food in the Last Year: Never true  Transportation Needs: No Transportation Needs  . Lack of Transportation (Medical): No  . Lack of Transportation (Non-Medical): No  Physical Activity: Insufficiently Active  . Days of Exercise per Week: 3 days  . Minutes of Exercise per Session: 30 min  Stress: No Stress Concern Present  . Feeling of Stress : Not at all  Social Connections: Moderately Isolated  . Frequency of Communication with Friends and Family: More than three times a week  . Frequency of Social Gatherings with Friends and Family: More than three times a week  . Attends Religious Services: More than 4 times per year  . Active Member of Clubs or Organizations: No  . Attends Archivist Meetings: Never  . Marital Status: Widowed  Intimate Partner Violence: Not At Risk  . Fear of Current or Ex-Partner: No  . Emotionally  Abused: No  . Physically Abused: No  . Sexually Abused: No    FAMILY HISTORY:  Family History  Problem Relation Age of Onset  . Heart attack Mother   . Stroke Father   . Heart attack Sister   . Lung cancer Brother     CURRENT MEDICATIONS:  Outpatient Encounter Medications as of 03/01/2020  Medication Sig  . abiraterone acetate (ZYTIGA) 250 MG tablet Take 4 tablets (1,000 mg total) by mouth daily. Take on an empty stomach 1 hour before or 2 hours after a meal  . allopurinol (ZYLOPRIM) 300 MG tablet Take 300 mg by mouth daily.  Marland Kitchen amLODipine (NORVASC) 5 MG tablet Take 1 tablet (5 mg  total) by mouth daily.  . cholecalciferol (VITAMIN D3) 25 MCG (1000 UNIT) tablet Take 1,000 Units by mouth daily.  Marland Kitchen docusate sodium (PHILLIPS STOOL SOFTENER) 100 MG capsule Take 100 mg by mouth daily as needed for mild constipation.  Marland Kitchen HYDROcodone-acetaminophen (NORCO/VICODIN) 5-325 MG tablet Take 2 tablets by mouth at bedtime as needed.  Marland Kitchen levothyroxine (SYNTHROID) 50 MCG tablet Take 50 mcg by mouth daily before breakfast.  . lidocaine (LIDODERM) 5 % Place 1 patch onto the skin daily. Remove & Discard patch within 12 hours or as directed by MD  . Multiple Vitamin (MULTIVITAMIN WITH MINERALS) TABS tablet Take 1 tablet by mouth daily.  . ondansetron (ZOFRAN ODT) 4 MG disintegrating tablet Take 1 tablet (4 mg total) by mouth every 8 (eight) hours as needed for nausea or vomiting.  . pantoprazole (PROTONIX) 40 MG tablet Take 40 mg by mouth daily.   . predniSONE (DELTASONE) 5 MG tablet Take 1 tablet (5 mg total) by mouth daily with breakfast.  . rivaroxaban (XARELTO) 20 MG TABS tablet Take 20 mg by mouth in the morning.   . timolol (BETIMOL) 0.5 % ophthalmic solution Place 1 drop into the left eye in the morning.  Marland Kitchen acetaminophen (TYLENOL) 500 MG tablet Take 1,000 mg by mouth daily as needed for fever (for pain.).  (Patient not taking: Reported on 03/01/2020)   No facility-administered encounter medications on file as of 03/01/2020.    ALLERGIES:  Allergies  Allergen Reactions  . Oxycodone Nausea And Vomiting     PHYSICAL EXAM:  ECOG Performance status: 1  Vitals:   03/01/20 0951  BP: (!) 136/51  Pulse: 64  Resp: 18  Temp: (!) 97 F (36.1 C)  SpO2: 100%   Filed Weights   03/01/20 0951  Weight: 206 lb 12.7 oz (93.8 kg)   Physical Exam Constitutional:      Appearance: Normal appearance. He is normal weight.  Cardiovascular:     Rate and Rhythm: Normal rate and regular rhythm.     Heart sounds: Normal heart sounds.  Pulmonary:     Effort: Pulmonary effort is normal.     Breath  sounds: Normal breath sounds.  Abdominal:     General: Bowel sounds are normal.     Palpations: Abdomen is soft.  Musculoskeletal:        General: Normal range of motion.  Skin:    General: Skin is warm.  Neurological:     Mental Status: He is alert and oriented to person, place, and time. Mental status is at baseline.  Psychiatric:        Mood and Affect: Mood normal.        Behavior: Behavior normal.        Thought Content: Thought content normal.  Judgment: Judgment normal.      LABORATORY DATA:  I have reviewed the labs as listed.  CBC    Component Value Date/Time   WBC 8.4 03/01/2020 0850   RBC 3.59 (L) 03/01/2020 0850   HGB 8.8 (L) 03/01/2020 0850   HCT 30.7 (L) 03/01/2020 0850   PLT 191 03/01/2020 0850   MCV 85.5 03/01/2020 0850   MCH 24.5 (L) 03/01/2020 0850   MCHC 28.7 (L) 03/01/2020 0850   RDW 18.8 (H) 03/01/2020 0850   LYMPHSABS 1.6 03/01/2020 0850   MONOABS 0.8 03/01/2020 0850   EOSABS 0.2 03/01/2020 0850   BASOSABS 0.0 03/01/2020 0850   CMP Latest Ref Rng & Units 03/01/2020 01/29/2020 12/31/2019  Glucose 70 - 99 mg/dL 96 106(H) 101(H)  BUN 8 - 23 mg/dL 27(H) 23 27(H)  Creatinine 0.61 - 1.24 mg/dL 0.95 0.89 0.83  Sodium 135 - 145 mmol/L 139 138 137  Potassium 3.5 - 5.1 mmol/L 3.8 4.5 4.1  Chloride 98 - 111 mmol/L 105 104 102  CO2 22 - 32 mmol/L 25 26 26   Calcium 8.9 - 10.3 mg/dL 8.7(L) 8.9 8.9  Total Protein 6.5 - 8.1 g/dL 7.4 7.4 7.4  Total Bilirubin 0.3 - 1.2 mg/dL 0.5 0.5 0.4  Alkaline Phos 38 - 126 U/L 84 111 170(H)  AST 15 - 41 U/L 21 24 23   ALT 0 - 44 U/L 14 15 16     All questions were answered to patient's stated satisfaction. Encouraged patient to call with any new concerns or questions before his next visit to the cancer center and we can certain see him sooner, if needed.     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 Titus Mould in [place] around September 2020 with prostate cancer. -Presentation with lower back pain to the ER on 10/21/2019 with MRI of the lumbar spine showing bone metastasis involving L2, T12 vertebral bodies with no extraosseous tumor.  Extensive bulky retroperitoneal adenopathy. -PSA was 390. -PET scan on 11/10/2019 showed nodal metastasis involving chest, abdomen and pelvis.  Bone metastasis including destructive soft tissue mass involving right posterior column of the acetabulum. -Left external iliac lymph node biopsy on 11/18/2019 consistent with prostatic adenocarcinoma. -Firmagon started on 11/24/2019. -Zytiga 500 mg with prednisone 5 mg started on 12/10/2019 -He reports hot flashes up to 5 times per day and night.  Urinary frequency has been stable. -Zytiga was increased to 750 mg daily.  Continuing prednisone 5 mg daily -Labs done 5 weeks post dose increase were potassium 3.8, creatinine 0.95, LFTs WNL, WBC 8.4, hemoglobin 8.8, platelets 191 -We will keep him at the same dose of Zytiga 750 mg -He will follow-up in 4 weeks with repeat labs.  2.  Lower back pain: -This completely resolved after starting him on Firmagon and Zytiga  3.  Atrial fibrillation: -Continue Xarelto -No bleeding issues  4.  Bone metastasis: -Continue monthly Xgeva. -He will continue taking calcium supplements twice daily.     Orders placed this encounter:  Orders Placed This Encounter  Procedures  . Lactate dehydrogenase  . CBC with Differential/Platelet  . Comprehensive metabolic panel  . Ferritin  . Iron and TIBC  . Vitamin B12  . VITAMIN D 25 Hydroxy (Vit-D Deficiency, Fractures)  . Folate  . PSA      Francene Finders, FNP-C [place] [phone]

## 2020-03-01 NOTE — Assessment & Plan Note (Addendum)
1.  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 Titus Mould in [place] around September 2020 with prostate cancer. -Presentation with lower back pain to the ER on 10/21/2019 with MRI of the lumbar spine showing bone metastasis involving L2, T12 vertebral bodies with no extraosseous tumor.  Extensive bulky retroperitoneal adenopathy. -PSA was 390. -PET scan on 11/10/2019 showed nodal metastasis involving chest, abdomen and pelvis.  Bone metastasis including destructive soft tissue mass involving right posterior column of the acetabulum. -Left external iliac lymph node biopsy on 11/18/2019 consistent with prostatic adenocarcinoma. -Firmagon started on 11/24/2019. -Zytiga 500 mg with prednisone 5 mg started on 12/10/2019 -He reports hot flashes up to 5 times per day and night.  Urinary frequency has been stable. -Zytiga was increased to 750 mg daily.  Continuing prednisone 5 mg daily -Labs done 5 weeks post dose increase were potassium 3.8, creatinine 0.95, LFTs WNL, WBC 8.4, hemoglobin 8.8, platelets 191 -We will keep him at the same dose of Zytiga 750 mg -He will follow-up in 4 weeks with repeat labs.  2.  Lower back pain: -This completely resolved after starting him on Firmagon and Zytiga  3.  Atrial fibrillation: -Continue Xarelto -No bleeding issues  4.  Bone metastasis: -Continue monthly Xgeva. -He will continue taking calcium supplements twice daily.

## 2020-03-01 NOTE — Progress Notes (Signed)
Patient taking calcium as directed.  Denied tooth, jaw, and leg pain.  No recent or upcoming dental visits.  Patient tolerated Xgeva injection with no complaints.  Site clean and dry with no bruising or swelling noted at site.  Band aid applied.  VSS with discharge and left ambulatory with no s/s of distress noted. 

## 2020-03-02 LAB — TESTOSTERONE: Testosterone: <3 ng/dL — ABNORMAL LOW (ref 264–916)

## 2020-03-10 ENCOUNTER — Ambulatory Visit (HOSPITAL_COMMUNITY): Payer: Medicare Other | Admitting: Genetic Counselor

## 2020-03-10 ENCOUNTER — Other Ambulatory Visit (HOSPITAL_COMMUNITY): Payer: Medicare Other

## 2020-03-18 ENCOUNTER — Other Ambulatory Visit (HOSPITAL_COMMUNITY): Payer: Self-pay | Admitting: *Deleted

## 2020-03-18 MED ORDER — HYDROCODONE-ACETAMINOPHEN 5-325 MG PO TABS: 2.0000 | ORAL_TABLET | Freq: Every evening | ORAL | 0 refills | Status: DC | PRN

## 2020-03-29 ENCOUNTER — Inpatient Hospital Stay (HOSPITAL_COMMUNITY): Payer: Medicare Other

## 2020-03-29 ENCOUNTER — Inpatient Hospital Stay (HOSPITAL_BASED_OUTPATIENT_CLINIC_OR_DEPARTMENT_OTHER): Payer: Medicare Other | Admitting: Hematology

## 2020-03-29 ENCOUNTER — Other Ambulatory Visit: Payer: Self-pay

## 2020-03-29 VITALS — BP 144/54 | HR 59 | Temp 98.6°F | Resp 16 | Wt 207.2 lb

## 2020-03-29 VITALS — BP 135/64 | HR 64 | Temp 98.5°F | Resp 18

## 2020-03-29 DIAGNOSIS — Z191 Hormone sensitive malignancy status: Secondary | ICD-10-CM | POA: Diagnosis not present

## 2020-03-29 DIAGNOSIS — C61 Malignant neoplasm of prostate: Secondary | ICD-10-CM | POA: Diagnosis not present

## 2020-03-29 DIAGNOSIS — C7951 Secondary malignant neoplasm of bone: Secondary | ICD-10-CM

## 2020-03-29 LAB — CBC WITH DIFFERENTIAL/PLATELET
Abs Immature Granulocytes: 0.06 10*3/uL (ref 0.00–0.07)
Basophils Absolute: 0 10*3/uL (ref 0.0–0.1)
Basophils Relative: 0 %
Eosinophils Absolute: 0.2 10*3/uL (ref 0.0–0.5)
Eosinophils Relative: 2 %
HCT: 30.4 % — ABNORMAL LOW (ref 39.0–52.0)
Hemoglobin: 8.8 g/dL — ABNORMAL LOW (ref 13.0–17.0)
Immature Granulocytes: 1 %
Lymphocytes Relative: 14 %
Lymphs Abs: 1 10*3/uL (ref 0.7–4.0)
MCH: 24.2 pg — ABNORMAL LOW (ref 26.0–34.0)
MCHC: 28.9 g/dL — ABNORMAL LOW (ref 30.0–36.0)
MCV: 83.7 fL (ref 80.0–100.0)
Monocytes Absolute: 0.7 10*3/uL (ref 0.1–1.0)
Monocytes Relative: 10 %
Neutro Abs: 4.8 10*3/uL (ref 1.7–7.7)
Neutrophils Relative %: 73 %
Platelets: 203 10*3/uL (ref 150–400)
RBC: 3.63 MIL/uL — ABNORMAL LOW (ref 4.22–5.81)
RDW: 17.5 % — ABNORMAL HIGH (ref 11.5–15.5)
WBC: 6.7 10*3/uL (ref 4.0–10.5)
nRBC: 0 % (ref 0.0–0.2)

## 2020-03-29 LAB — COMPREHENSIVE METABOLIC PANEL
ALT: 12 U/L (ref 0–44)
AST: 18 U/L (ref 15–41)
Albumin: 4.2 g/dL (ref 3.5–5.0)
Alkaline Phosphatase: 71 U/L (ref 38–126)
Anion gap: 9 (ref 5–15)
BUN: 22 mg/dL (ref 8–23)
CO2: 26 mmol/L (ref 22–32)
Calcium: 9.3 mg/dL (ref 8.9–10.3)
Chloride: 103 mmol/L (ref 98–111)
Creatinine, Ser: 0.88 mg/dL (ref 0.61–1.24)
GFR calc Af Amer: >60 mL/min (ref >60)
GFR calc non Af Amer: >60 mL/min (ref >60)
Glucose, Bld: 102 mg/dL — ABNORMAL HIGH (ref 70–99)
Potassium: 4.1 mmol/L (ref 3.5–5.1)
Sodium: 138 mmol/L (ref 135–145)
Total Bilirubin: 0.4 mg/dL (ref 0.3–1.2)
Total Protein: 7.5 g/dL (ref 6.5–8.1)

## 2020-03-29 LAB — FOLATE: Folate: 20.3 ng/mL (ref >5.9)

## 2020-03-29 LAB — IRON AND TIBC
Iron: 25 ug/dL — ABNORMAL LOW (ref 45–182)
Saturation Ratios: 5 % — ABNORMAL LOW (ref 17.9–39.5)
TIBC: 490 ug/dL — ABNORMAL HIGH (ref 250–450)
UIBC: 465 ug/dL

## 2020-03-29 LAB — FERRITIN: Ferritin: 5 ng/mL — ABNORMAL LOW (ref 24–336)

## 2020-03-29 LAB — LACTATE DEHYDROGENASE: LDH: 187 U/L (ref 98–192)

## 2020-03-29 LAB — PSA: Prostatic Specific Antigen: 0.22 ng/mL (ref 0.00–4.00)

## 2020-03-29 LAB — VITAMIN D 25 HYDROXY (VIT D DEFICIENCY, FRACTURES): Vit D, 25-Hydroxy: 40.61 ng/mL (ref 30–100)

## 2020-03-29 LAB — VITAMIN B12: Vitamin B-12: 338 pg/mL (ref 180–914)

## 2020-03-29 MED ORDER — MIRABEGRON ER 25 MG PO TB24: 25.0000 mg | ORAL_TABLET | Freq: Every day | ORAL | 2 refills | Status: DC

## 2020-03-29 MED ORDER — DENOSUMAB 120 MG/1.7ML ~~LOC~~ SOLN
120.0000 mg | Freq: Once | SUBCUTANEOUS | Status: AC
  Administered 2020-03-29: 120 mg via SUBCUTANEOUS
  Filled 2020-03-29: qty 1.7

## 2020-03-29 MED ORDER — SODIUM CHLORIDE 0.9 % IV SOLN: Freq: Once | INTRAVENOUS | Status: AC

## 2020-03-29 MED ORDER — SODIUM CHLORIDE 0.9 % IV SOLN
510.0000 mg | Freq: Once | INTRAVENOUS | Status: AC
  Administered 2020-03-29: 510 mg via INTRAVENOUS
  Filled 2020-03-29: qty 17

## 2020-03-29 NOTE — Progress Notes (Signed)
Patient has been taking abiraterone 750 mg daily and prednisone 5 mg daily and has not missed any doses.  He has some dribbling when he urinates, Dr. Delton Coombes gave orders for myrbetriq 25 mg daily.  It has been sent to his pharmacy.   Patient was assessed by Dr. Delton Coombes and labs have been reviewed.  Patient is okay to proceed with xgeva injection today.  His iron is also low today Primary RN and pharmacy aware.

## 2020-03-29 NOTE — Progress Notes (Signed)
Steven Gutierrez, [place] [address]   CLINIC:  Medical Oncology/Hematology  PCP:  [name], MD [place] [address] [place] [address] [phone]   REASON FOR VISIT:  Follow-up for metastatic prostate cancer  PRIOR THERAPY: None  NGS Results: Pending  CURRENT THERAPY: Lupron, abiraterone with prednisone  BRIEF ONCOLOGIC HISTORY:  Oncology History   No history exists.    CANCER STAGING: Cancer Staging No matching staging information was found for the patient.  INTERVAL HISTORY:  [name], a 86 y.o. male, returns for routine follow-up of his metastatic prostate cancer. Steven Gutierrez was last seen on 12/31/2019.  Today he reports feeling okay. He complains of pain and restlessness in his right knee which is worse at night when he sleeps, though it is not as bad when he walks or sits. He is tolerating the Zytiga well, though he reports incontinence when he sneezes or coughs; he takes 3 tablets every morning 1 hour before food and then takes prednisone with food. He is able to empty his bladder and his stream is strong, though he has to urinate every 2 hours at night. He is seeing a urologist in [place] for his overactive bladder, though multiple therapies were tried without any help. He bleeds occasionally bright red blood from his hemorrhoids.   REVIEW OF SYSTEMS:  Review of Systems  Constitutional: Positive for fatigue (mild). Negative for appetite change.  Gastrointestinal: Positive for blood in stool (occasional from hemorrhoids).  Genitourinary: Positive for bladder incontinence (overactive bladder).   Musculoskeletal: Positive for back pain (8/10 back pain radiating down R leg).  Psychiatric/Behavioral: Positive for sleep disturbance.  All other systems reviewed and are negative.   PAST MEDICAL/SURGICAL HISTORY:  Past Medical History:  Diagnosis Date  . A-fib ([place])   . Cancer ([place])    skin cancer  . Glaucoma 11/17/2019  .  Gout 11/17/2019  . Hypertension   . Hypothyroidism 11/17/2019   Past Surgical History:  Procedure Laterality Date  . ESOPHAGOGASTRODUODENOSCOPY  03/2018  . TRANSURETHRAL RESECTION OF PROSTATE  04/21/2019    SOCIAL HISTORY:  Social History   Socioeconomic History  . Marital status: Widowed    Spouse name: Not on file  . Number of children: 2  . Years of education: Not on file  . Highest education level: Not on file  Occupational History  . Occupation: Retired  Tobacco Use  . Smoking status: Former Smoker    Packs/day: 0.50    Types: Cigarettes  . Smokeless tobacco: Never Used  Vaping Use  . Vaping Use: Never used  Substance and Sexual Activity  . Alcohol use: Yes    Alcohol/week: 1.0 standard drink    Types: 1 Glasses of wine per week    Comment: occ  . Drug use: Never  . Sexual activity: Not Currently  Other Topics Concern  . Not on file  Social History Narrative  . Not on file   Social Determinants of Health   Financial Resource Strain: Low Risk   . Difficulty of Paying Living Expenses: Not hard at all  Food Insecurity: No Food Insecurity  . Worried About Charity fundraiser in the Last Year: Never true  . Ran Out of Food in the Last Year: Never true  Transportation Needs: No Transportation Needs  . Lack of Transportation (Medical): No  . Lack of Transportation (Non-Medical): No  Physical Activity: Insufficiently Active  . Days of Exercise per Week: 3 days  .  Minutes of Exercise per Session: 30 min  Stress: No Stress Concern Present  . Feeling of Stress : Not at all  Social Connections: Moderately Isolated  . Frequency of Communication with Friends and Family: More than three times a week  . Frequency of Social Gatherings with Friends and Family: More than three times a week  . Attends Religious Services: More than 4 times per year  . Active Member of Clubs or Organizations: No  . Attends Archivist Meetings: Never  . Marital Status: Widowed    Intimate Partner Violence: Not At Risk  . Fear of Current or Ex-Partner: No  . Emotionally Abused: No  . Physically Abused: No  . Sexually Abused: No    FAMILY HISTORY:  Family History  Problem Relation Age of Onset  . Heart attack Mother   . Stroke Father   . Heart attack Sister   . Lung cancer Brother     CURRENT MEDICATIONS:  Current Outpatient Medications  Medication Sig Dispense Refill  . abiraterone acetate (ZYTIGA) 250 MG tablet Take 4 tablets (1,000 mg total) by mouth daily. Take on an empty stomach 1 hour before or 2 hours after a meal 120 tablet 2  . acetaminophen (TYLENOL) 500 MG tablet Take 1,000 mg by mouth daily as needed for fever (for pain.).     Marland Kitchen allopurinol (ZYLOPRIM) 300 MG tablet Take 300 mg by mouth daily.    . cholecalciferol (VITAMIN D3) 25 MCG (1000 UNIT) tablet Take 1,000 Units by mouth daily.    Marland Kitchen docusate sodium (PHILLIPS STOOL SOFTENER) 100 MG capsule Take 100 mg by mouth daily as needed for mild constipation.    Marland Kitchen HYDROcodone-acetaminophen (NORCO/VICODIN) 5-325 MG tablet Take 2 tablets by mouth at bedtime as needed. 60 tablet 0  . levothyroxine (SYNTHROID) 50 MCG tablet Take 50 mcg by mouth daily before breakfast.    . lidocaine (LIDODERM) 5 % Place 1 patch onto the skin daily. Remove & Discard patch within 12 hours or as directed by MD 30 patch 0  . Multiple Vitamin (MULTIVITAMIN WITH MINERALS) TABS tablet Take 1 tablet by mouth daily.    . pantoprazole (PROTONIX) 40 MG tablet Take 40 mg by mouth daily.     . predniSONE (DELTASONE) 5 MG tablet Take 1 tablet (5 mg total) by mouth daily with breakfast. 30 tablet 6  . rivaroxaban (XARELTO) 20 MG TABS tablet Take 20 mg by mouth in the morning.     . timolol (BETIMOL) 0.5 % ophthalmic solution Place 1 drop into the left eye in the morning.    Marland Kitchen amLODipine (NORVASC) 5 MG tablet Take 1 tablet (5 mg total) by mouth daily. 30 tablet 3  . mirabegron ER (MYRBETRIQ) 25 MG TB24 tablet Take 1 tablet (25 mg total)  by mouth daily. 30 tablet 2  . ondansetron (ZOFRAN ODT) 4 MG disintegrating tablet Take 1 tablet (4 mg total) by mouth every 8 (eight) hours as needed for nausea or vomiting. (Patient not taking: Reported on 03/29/2020) 20 tablet 0   No current facility-administered medications for this visit.    ALLERGIES:  Allergies  Allergen Reactions  . Oxycodone Nausea And Vomiting    PHYSICAL EXAM:  Performance status (ECOG): 1 - Symptomatic but completely ambulatory  Vitals:   03/29/20 1115  BP: (!) 144/54  Pulse: (!) 59  Resp: 16  Temp: 98.6 F (37 C)  SpO2: 100%   Wt Readings from Last 3 Encounters:  03/29/20 207 lb 3.2 oz (94  kg)  03/01/20 206 lb 12.7 oz (93.8 kg)  01/29/20 203 lb (92.1 kg)   Physical Exam Vitals reviewed.  Constitutional:      Appearance: Normal appearance.  Cardiovascular:     Rate and Rhythm: Normal rate and regular rhythm.     Pulses: Normal pulses.     Heart sounds: Normal heart sounds.  Pulmonary:     Effort: Pulmonary effort is normal.     Breath sounds: Normal breath sounds.  Musculoskeletal:     Right lower leg: No edema.     Left lower leg: No edema.  Neurological:     General: No focal deficit present.     Mental Status: He is alert and oriented to person, place, and time.  Psychiatric:        Mood and Affect: Mood normal.        Behavior: Behavior normal.      LABORATORY DATA:  I have reviewed the labs as listed.  CBC Latest Ref Rng & Units 03/29/2020 03/01/2020 01/29/2020  WBC 4.0 - 10.5 K/uL 6.7 8.4 7.2  Hemoglobin 13.0 - 17.0 g/dL 8.8(L) 8.8(L) 8.9(L)  Hematocrit 39 - 52 % 30.4(L) 30.7(L) 31.1(L)  Platelets 150 - 400 K/uL 203 191 215   CMP Latest Ref Rng & Units 03/29/2020 03/01/2020 01/29/2020  Glucose 70 - 99 mg/dL 102(H) 96 106(H)  BUN 8 - 23 mg/dL 22 27(H) 23  Creatinine 0.61 - 1.24 mg/dL 0.88 0.95 0.89  Sodium 135 - 145 mmol/L 138 139 138  Potassium 3.5 - 5.1 mmol/L 4.1 3.8 4.5  Chloride 98 - 111 mmol/L 103 105 104  CO2 22 - 32  mmol/L 26 25 26   Calcium 8.9 - 10.3 mg/dL 9.3 8.7(L) 8.9  Total Protein 6.5 - 8.1 g/dL 7.5 7.4 7.4  Total Bilirubin 0.3 - 1.2 mg/dL 0.4 0.5 0.5  Alkaline Phos 38 - 126 U/L 71 84 111  AST 15 - 41 U/L 18 21 24   ALT 0 - 44 U/L 12 14 15    Lab Results  Component Value Date   LDH 187 03/29/2020   LDH 222 (H) 10/28/2019   LDH 397 (H) 09/07/2019  No results found for: VD25OH No results found for: PSA Lab Results  Component Value Date   TIBC 490 (H) 03/29/2020   FERRITIN 5 (L) 03/29/2020   FERRITIN 75 09/06/2019   FERRITIN 91 09/05/2019   IRONPCTSAT 5 (L) 03/29/2020   DIAGNOSTIC IMAGING:  I have independently reviewed the scans and discussed with the patient. No results found.   ASSESSMENT:  1. 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 Titus Mould in [place] around September 2020 with prostate cancer. -Presentation with low back pain to the ER on 10/21/2019 with MRI of the lumbar spine showing bone metastasis involving L2, T12 vertebral bodies with no extraosseous tumor.  Extensive bulky retroperitoneal adenopathy. -PSA was 390.  PET scan on 11/10/2019 showed nodal metastasis involving chest, abdomen and pelvis.  Bone metastasis including destructive soft tissue mass involving right posterior column of the acetabulum. -Left external iliac lymph node biopsy on 11/18/2019 consistent with prostatic adenocarcinoma. -Firmagon started on 11/24/2019.  Abiraterone with prednisone started around 12/10/2019.   PLAN:  1. 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  He is taking it on empty stomach.  He is taking prednisone 5 mg daily. -Last PSA was 0.3 on 03/01/2020. -Continue abiraterone and prednisone  at this time as it is helping. -RTC in 2 months with labs. -Complaining of right knee pain.  Follow-up with orthopedics in [place].  2. Low back  pain: -Completely resolved since the start of Firmagon and Abiraterone.  3. Atrial fibrillation: -Continue Xarelto.  No bleeding issues.  4. Bone metastasis: -Continue monthly Xgeva.  Continue calcium supplements.  5.  Normocytic anemia: -Hemoglobin is 8.8.  MCV is 83.7.  Ferritin was 5 and percent saturation of 5.  Folic acid and D17 was normal. -Cannot tolerate iron pills due to constipation.  Have recommended Feraheme weekly x2.  We discussed side effects in detail.   Orders placed this encounter:  No orders of the defined types were placed in this encounter.    Derek Jack, MD [place] [phone]   I, Milinda Antis, am acting as a scribe for Dr. Sanda Linger.  I, Derek Jack MD, have reviewed the above documentation for accuracy and completeness, and I agree with the above.

## 2020-03-29 NOTE — Progress Notes (Signed)
Steven Gutierrez tolerated Feraheme infusion and Xgeva injection well without complaints or incident. Calcium 9.3 today and pt denied any tooth or jaw pain and no recent or future dental visits prior to administering the Xgeva. Peripheral IV site checked with positive blood return noted prior to and after infusion.VSS upon discharge/. Pt discharged self ambulatory in satisfactory condition

## 2020-03-29 NOTE — Patient Instructions (Signed)
[place] at [facility] Discharge Instructions  Received Feraheme infusion and Xgeva injection today. Follow-up as scheduled   Thank you for choosing [place] at [facility] to provide your oncology and hematology care.  To afford each patient quality time with our provider, please arrive at least 15 minutes before your scheduled appointment time.   If you have a lab appointment with the [facility] please come in thru the Main Entrance and check in at the main information desk.  You need to re-schedule your appointment should you arrive 10 or more minutes late.  We strive to give you quality time with our providers, and arriving late affects you and other patients whose appointments are after yours.  Also, if you no show three or more times for appointments you may be dismissed from the clinic at the providers discretion.     Again, thank you for choosing [facility].  Our hope is that these requests will decrease the amount of time that you wait before being seen by our physicians.       _____________________________________________________________  Should you have questions after your visit to [facility], please contact our office at [phone] and follow the prompts.  Our office hours are 8:00 a.m. and 4:30 p.m. Monday - Friday.  Please note that voicemails left after 4:00 p.m. may not be returned until the following business day.  We are closed weekends and major holidays.  You do have access to a nurse 24-7, just call the main number to the clinic [phone] and do not press any options, hold on the line and a nurse will answer the phone.    For prescription refill requests, have your pharmacy contact our office and allow 72 hours.    Due to Covid, you will need to wear a mask upon entering the hospital. If you do not have a mask, a mask will be given to you at the Main Entrance upon arrival. For doctor  visits, patients may have 1 support person age 8 or older with them. For treatment visits, patients can not have anyone with them due to social distancing guidelines and our immunocompromised population.

## 2020-03-29 NOTE — Patient Instructions (Signed)
[place] at [facility] Discharge Instructions  You were seen today by Dr. Delton Coombes. He went over your recent results. You received your injection today; continue receiving your monthly injections. You will be scheduled for two iron infusions with one week apart. You will be prescribed mirabegron to take daily for your bladder incontinence. Dr. Delton Coombes will see you back in 2 months for labs and follow up.   Thank you for choosing [place] at [facility] to provide your oncology and hematology care.  To afford each patient quality time with our provider, please arrive at least 15 minutes before your scheduled appointment time.   If you have a lab appointment with the [facility] please come in thru the Main Entrance and check in at the main information desk  You need to re-schedule your appointment should you arrive 10 or more minutes late.  We strive to give you quality time with our providers, and arriving late affects you and other patients whose appointments are after yours.  Also, if you no show three or more times for appointments you may be dismissed from the clinic at the providers discretion.     Again, thank you for choosing [facility].  Our hope is that these requests will decrease the amount of time that you wait before being seen by our physicians.       _____________________________________________________________  Should you have questions after your visit to [facility], please contact our office at [phone] between the hours of 8:00 a.m. and 4:30 p.m.  Voicemails left after 4:00 p.m. will not be returned until the following business day.  For prescription refill requests, have your pharmacy contact our office and allow 72 hours.    Cancer Center Support Programs:   > Cancer Support Group  2nd Tuesday of the month 1pm-2pm, Journey Room

## 2020-04-05 ENCOUNTER — Other Ambulatory Visit (HOSPITAL_COMMUNITY): Payer: Self-pay

## 2020-04-07 MED FILL — ABIRATERONE ACETATE 250 MG: 250 | 30 days supply | Qty: 120 | Fill #2

## 2020-04-26 ENCOUNTER — Other Ambulatory Visit: Payer: Self-pay

## 2020-04-26 ENCOUNTER — Inpatient Hospital Stay (HOSPITAL_COMMUNITY): Payer: Medicare Other | Attending: Hematology

## 2020-04-26 ENCOUNTER — Ambulatory Visit (HOSPITAL_COMMUNITY): Payer: Medicare Other | Admitting: Hematology

## 2020-04-26 ENCOUNTER — Other Ambulatory Visit (HOSPITAL_COMMUNITY): Payer: Medicare Other

## 2020-04-26 ENCOUNTER — Inpatient Hospital Stay (HOSPITAL_COMMUNITY): Payer: Medicare Other

## 2020-04-26 VITALS — BP 134/53 | HR 58 | Temp 97.1°F | Resp 17

## 2020-04-26 DIAGNOSIS — D649 Anemia, unspecified: Secondary | ICD-10-CM | POA: Insufficient documentation

## 2020-04-26 DIAGNOSIS — Z87891 Personal history of nicotine dependence: Secondary | ICD-10-CM | POA: Diagnosis not present

## 2020-04-26 DIAGNOSIS — C779 Secondary and unspecified malignant neoplasm of lymph node, unspecified: Secondary | ICD-10-CM | POA: Insufficient documentation

## 2020-04-26 DIAGNOSIS — C7951 Secondary malignant neoplasm of bone: Secondary | ICD-10-CM | POA: Diagnosis present

## 2020-04-26 DIAGNOSIS — I10 Essential (primary) hypertension: Secondary | ICD-10-CM | POA: Insufficient documentation

## 2020-04-26 DIAGNOSIS — Z79899 Other long term (current) drug therapy: Secondary | ICD-10-CM | POA: Insufficient documentation

## 2020-04-26 DIAGNOSIS — Z801 Family history of malignant neoplasm of trachea, bronchus and lung: Secondary | ICD-10-CM | POA: Insufficient documentation

## 2020-04-26 DIAGNOSIS — E039 Hypothyroidism, unspecified: Secondary | ICD-10-CM | POA: Diagnosis not present

## 2020-04-26 DIAGNOSIS — C61 Malignant neoplasm of prostate: Secondary | ICD-10-CM | POA: Diagnosis present

## 2020-04-26 DIAGNOSIS — Z8249 Family history of ischemic heart disease and other diseases of the circulatory system: Secondary | ICD-10-CM | POA: Diagnosis not present

## 2020-04-26 DIAGNOSIS — Z191 Hormone sensitive malignancy status: Secondary | ICD-10-CM

## 2020-04-26 DIAGNOSIS — I4891 Unspecified atrial fibrillation: Secondary | ICD-10-CM | POA: Insufficient documentation

## 2020-04-26 DIAGNOSIS — Z7901 Long term (current) use of anticoagulants: Secondary | ICD-10-CM | POA: Insufficient documentation

## 2020-04-26 DIAGNOSIS — M545 Low back pain: Secondary | ICD-10-CM | POA: Diagnosis not present

## 2020-04-26 LAB — COMPREHENSIVE METABOLIC PANEL
ALT: 16 U/L (ref 0–44)
AST: 23 U/L (ref 15–41)
Albumin: 4.2 g/dL (ref 3.5–5.0)
Alkaline Phosphatase: 76 U/L (ref 38–126)
Anion gap: 8 (ref 5–15)
BUN: 23 mg/dL (ref 8–23)
CO2: 27 mmol/L (ref 22–32)
Calcium: 8.7 mg/dL — ABNORMAL LOW (ref 8.9–10.3)
Chloride: 103 mmol/L (ref 98–111)
Creatinine, Ser: 0.9 mg/dL (ref 0.61–1.24)
GFR calc Af Amer: >60 mL/min (ref >60)
GFR calc non Af Amer: >60 mL/min (ref >60)
Glucose, Bld: 109 mg/dL — ABNORMAL HIGH (ref 70–99)
Potassium: 4.3 mmol/L (ref 3.5–5.1)
Sodium: 138 mmol/L (ref 135–145)
Total Bilirubin: 0.6 mg/dL (ref 0.3–1.2)
Total Protein: 7.3 g/dL (ref 6.5–8.1)

## 2020-04-26 LAB — CBC WITH DIFFERENTIAL/PLATELET
Abs Immature Granulocytes: 0.09 10*3/uL — ABNORMAL HIGH (ref 0.00–0.07)
Basophils Absolute: 0 10*3/uL (ref 0.0–0.1)
Basophils Relative: 0 %
Eosinophils Absolute: 0.1 10*3/uL (ref 0.0–0.5)
Eosinophils Relative: 1 %
HCT: 36.9 % — ABNORMAL LOW (ref 39.0–52.0)
Hemoglobin: 10.9 g/dL — ABNORMAL LOW (ref 13.0–17.0)
Immature Granulocytes: 1 %
Lymphocytes Relative: 15 %
Lymphs Abs: 1.3 10*3/uL (ref 0.7–4.0)
MCH: 26.3 pg (ref 26.0–34.0)
MCHC: 29.5 g/dL — ABNORMAL LOW (ref 30.0–36.0)
MCV: 88.9 fL (ref 80.0–100.0)
Monocytes Absolute: 0.7 10*3/uL (ref 0.1–1.0)
Monocytes Relative: 8 %
Neutro Abs: 6.6 10*3/uL (ref 1.7–7.7)
Neutrophils Relative %: 75 %
Platelets: 208 10*3/uL (ref 150–400)
RBC: 4.15 MIL/uL — ABNORMAL LOW (ref 4.22–5.81)
RDW: 21.8 % — ABNORMAL HIGH (ref 11.5–15.5)
WBC: 8.9 10*3/uL (ref 4.0–10.5)
nRBC: 0 % (ref 0.0–0.2)

## 2020-04-26 LAB — PSA: Prostatic Specific Antigen: 0.3 ng/mL (ref 0.00–4.00)

## 2020-04-26 MED ORDER — SODIUM CHLORIDE 0.9 % IV SOLN: Freq: Once | INTRAVENOUS | Status: AC

## 2020-04-26 MED ORDER — DENOSUMAB 120 MG/1.7ML ~~LOC~~ SOLN
120.0000 mg | Freq: Once | SUBCUTANEOUS | Status: AC
  Administered 2020-04-26: 120 mg via SUBCUTANEOUS
  Filled 2020-04-26: qty 1.7

## 2020-04-26 MED ORDER — SODIUM CHLORIDE 0.9 % IV SOLN
510.0000 mg | Freq: Once | INTRAVENOUS | Status: AC
  Administered 2020-04-26: 510 mg via INTRAVENOUS
  Filled 2020-04-26: qty 510

## 2020-04-26 NOTE — Progress Notes (Signed)
Steven Gutierrez presents today for IV iron infusion as well as Xgeva injection. Lab work reviewed prior to administration. VSS. Pt reports taking Ca and Vit D as instructed. Pt denies tooth/jaw pain and denies recent or future invasive dental work. Injection tolerated well, see MAR for details. Site clean and dry, band aid applied. Iron infusion tolerated without incident or complaint. See MAR for details. VSS prior to and post infusion. Discharged in satisfactory condition with follow up instructions.

## 2020-04-26 NOTE — Patient Instructions (Signed)
[facility] at [facility]  Discharge Instructions:  Denosumab injection What is this medicine? DENOSUMAB (den oh sue mab) slows bone breakdown. Prolia is used to treat osteoporosis in women after menopause and in men, and in people who are taking corticosteroids for 6 months or more. Xgeva is used to treat a high calcium level due to cancer and to prevent bone fractures and other bone problems caused by multiple myeloma or cancer bone metastases. Xgeva is also used to treat giant cell tumor of the bone. This medicine may be used for other purposes; ask your health care provider or pharmacist if you have questions. COMMON BRAND NAME(S): Prolia, XGEVA What should I tell my health care provider before I take this medicine? They need to know if you have any of these conditions:  dental disease  having surgery or tooth extraction  infection  kidney disease  low levels of calcium or Vitamin D in the blood  malnutrition  on hemodialysis  skin conditions or sensitivity  thyroid or parathyroid disease  an unusual reaction to denosumab, other medicines, foods, dyes, or preservatives  pregnant or trying to get pregnant  breast-feeding How should I use this medicine? This medicine is for injection under the skin. It is given by a health care professional in a hospital or clinic setting. A special MedGuide will be given to you before each treatment. Be sure to read this information carefully each time. For Prolia, talk to your pediatrician regarding the use of this medicine in children. Special care may be needed. For Xgeva, talk to your pediatrician regarding the use of this medicine in children. While this drug may be prescribed for children as young as 13 years for selected conditions, precautions do apply. Overdosage: If you think you have taken too much of this medicine contact a poison control center or emergency room at once. NOTE: This medicine is only for  you. Do not share this medicine with others. What if I miss a dose? It is important not to miss your dose. Call your doctor or health care professional if you are unable to keep an appointment. What may interact with this medicine? Do not take this medicine with any of the following medications:  other medicines containing denosumab This medicine may also interact with the following medications:  medicines that lower your chance of fighting infection  steroid medicines like prednisone or cortisone This list may not describe all possible interactions. Give your health care provider a list of all the medicines, herbs, non-prescription drugs, or dietary supplements you use. Also tell them if you smoke, drink alcohol, or use illegal drugs. Some items may interact with your medicine. What should I watch for while using this medicine? Visit your doctor or health care professional for regular checks on your progress. Your doctor or health care professional may order blood tests and other tests to see how you are doing. Call your doctor or health care professional for advice if you get a fever, chills or sore throat, or other symptoms of a cold or flu. Do not treat yourself. This drug may decrease your body's ability to fight infection. Try to avoid being around people who are sick. You should make sure you get enough calcium and vitamin D while you are taking this medicine, unless your doctor tells you not to. Discuss the foods you eat and the vitamins you take with your health care professional. See your dentist regularly. Brush and floss your teeth as directed.   Before you have any dental work done, tell your dentist you are receiving this medicine. Do not become pregnant while taking this medicine or for 5 months after stopping it. Talk with your doctor or health care professional about your birth control options while taking this medicine. Women should inform their doctor if they wish to become  pregnant or think they might be pregnant. There is a potential for serious side effects to an unborn child. Talk to your health care professional or pharmacist for more information. What side effects may I notice from receiving this medicine? Side effects that you should report to your doctor or health care professional as soon as possible:  allergic reactions like skin rash, itching or hives, swelling of the face, lips, or tongue  bone pain  breathing problems  dizziness  jaw pain, especially after dental work  redness, blistering, peeling of the skin  signs and symptoms of infection like fever or chills; cough; sore throat; pain or trouble passing urine  signs of low calcium like fast heartbeat, muscle cramps or muscle pain; pain, tingling, numbness in the hands or feet; seizures  unusual bleeding or bruising  unusually weak or tired Side effects that usually do not require medical attention (report to your doctor or health care professional if they continue or are bothersome):  constipation  diarrhea  headache  joint pain  loss of appetite  muscle pain  runny nose  tiredness  upset stomach This list may not describe all possible side effects. Call your doctor for medical advice about side effects. You may report side effects to FDA at 1-800-FDA-1088. Where should I keep my medicine? This medicine is only given in a clinic, doctor's office, or other health care setting and will not be stored at home. NOTE: This sheet is a summary. It may not cover all possible information. If you have questions about this medicine, talk to your doctor, pharmacist, or health care provider.  2020 Elsevier/Gold Standard (2017-11-22 16:10:44)  _______________________________________________________________  Thank you for choosing [facility] at [facility] to provide your oncology and hematology care.  To afford each patient quality time with our providers,  please arrive at least 15 minutes before your scheduled appointment.  You need to re-schedule your appointment if you arrive 10 or more minutes late.  We strive to give you quality time with our providers, and arriving late affects you and other patients whose appointments are after yours.  Also, if you no show three or more times for appointments you may be dismissed from the clinic.  Again, thank you for choosing [facility] at [facility]. Our hope is that these requests will allow you access to exceptional care and in a timely manner. _______________________________________________________________  If you have questions after your visit, please contact our office at [phone] between the hours of 8:30 a.m. and 5:00 p.m. Voicemails left after 4:30 p.m. will not be returned until the following business day. _______________________________________________________________  For prescription refill requests, have your pharmacy contact our office. _______________________________________________________________  Recommendations made by the consultant and any test results will be sent to your referring physician. _______________________________________________________________ 

## 2020-04-28 ENCOUNTER — Other Ambulatory Visit (HOSPITAL_COMMUNITY): Payer: Self-pay

## 2020-04-28 DIAGNOSIS — Z191 Hormone sensitive malignancy status: Secondary | ICD-10-CM

## 2020-04-28 DIAGNOSIS — C61 Malignant neoplasm of prostate: Secondary | ICD-10-CM

## 2020-04-29 ENCOUNTER — Other Ambulatory Visit (HOSPITAL_COMMUNITY): Payer: Self-pay

## 2020-04-29 MED ORDER — PREDNISONE 5 MG PO TABS: 5.0000 mg | ORAL_TABLET | Freq: Every day | ORAL | 6 refills | Status: AC

## 2020-05-04 ENCOUNTER — Other Ambulatory Visit (HOSPITAL_COMMUNITY): Payer: Self-pay | Admitting: Hematology

## 2020-05-04 DIAGNOSIS — Z191 Hormone sensitive malignancy status: Secondary | ICD-10-CM

## 2020-05-04 DIAGNOSIS — C61 Malignant neoplasm of prostate: Secondary | ICD-10-CM

## 2020-05-11 ENCOUNTER — Other Ambulatory Visit (HOSPITAL_COMMUNITY): Payer: Self-pay

## 2020-05-11 ENCOUNTER — Other Ambulatory Visit (HOSPITAL_COMMUNITY): Payer: Self-pay | Admitting: *Deleted

## 2020-05-11 DIAGNOSIS — C61 Malignant neoplasm of prostate: Secondary | ICD-10-CM

## 2020-05-11 DIAGNOSIS — Z191 Hormone sensitive malignancy status: Secondary | ICD-10-CM

## 2020-05-11 MED ORDER — ABIRATERONE ACETATE 250 MG PO TABS: 750.0000 mg | ORAL_TABLET | Freq: Every day | ORAL | 2 refills | Status: AC

## 2020-05-11 MED ORDER — ABIRATERONE ACETATE 250 MG PO TABS: 1000.0000 mg | ORAL_TABLET | Freq: Every day | ORAL | 2 refills | Status: DC

## 2020-05-11 MED ORDER — ABIRATERONE ACETATE 250 MG PO TABS: 750.0000 mg | ORAL_TABLET | Freq: Every day | ORAL | 2 refills | Status: DC

## 2020-05-17 MED FILL — ABIRATERONE ACETATE 250 MG: 250 | 30 days supply | Qty: 90 | Fill #0

## 2020-05-24 ENCOUNTER — Inpatient Hospital Stay (HOSPITAL_COMMUNITY): Payer: Medicare Other

## 2020-05-24 ENCOUNTER — Other Ambulatory Visit: Payer: Self-pay

## 2020-05-24 ENCOUNTER — Encounter (HOSPITAL_COMMUNITY): Payer: Self-pay | Admitting: Hematology

## 2020-05-24 ENCOUNTER — Inpatient Hospital Stay (HOSPITAL_BASED_OUTPATIENT_CLINIC_OR_DEPARTMENT_OTHER): Payer: Medicare Other | Admitting: Hematology

## 2020-05-24 ENCOUNTER — Inpatient Hospital Stay (HOSPITAL_COMMUNITY): Payer: Medicare Other | Attending: Hematology

## 2020-05-24 VITALS — BP 133/68 | HR 56 | Temp 96.6°F | Resp 18 | Wt 210.3 lb

## 2020-05-24 DIAGNOSIS — Z191 Hormone sensitive malignancy status: Secondary | ICD-10-CM

## 2020-05-24 DIAGNOSIS — Z801 Family history of malignant neoplasm of trachea, bronchus and lung: Secondary | ICD-10-CM | POA: Insufficient documentation

## 2020-05-24 DIAGNOSIS — Z7952 Long term (current) use of systemic steroids: Secondary | ICD-10-CM | POA: Diagnosis not present

## 2020-05-24 DIAGNOSIS — Z7901 Long term (current) use of anticoagulants: Secondary | ICD-10-CM | POA: Diagnosis not present

## 2020-05-24 DIAGNOSIS — D649 Anemia, unspecified: Secondary | ICD-10-CM | POA: Insufficient documentation

## 2020-05-24 DIAGNOSIS — Z79899 Other long term (current) drug therapy: Secondary | ICD-10-CM | POA: Insufficient documentation

## 2020-05-24 DIAGNOSIS — C7951 Secondary malignant neoplasm of bone: Secondary | ICD-10-CM | POA: Diagnosis present

## 2020-05-24 DIAGNOSIS — C61 Malignant neoplasm of prostate: Secondary | ICD-10-CM | POA: Insufficient documentation

## 2020-05-24 DIAGNOSIS — E039 Hypothyroidism, unspecified: Secondary | ICD-10-CM | POA: Insufficient documentation

## 2020-05-24 DIAGNOSIS — Z8249 Family history of ischemic heart disease and other diseases of the circulatory system: Secondary | ICD-10-CM | POA: Insufficient documentation

## 2020-05-24 DIAGNOSIS — I10 Essential (primary) hypertension: Secondary | ICD-10-CM | POA: Insufficient documentation

## 2020-05-24 DIAGNOSIS — Z87891 Personal history of nicotine dependence: Secondary | ICD-10-CM | POA: Diagnosis not present

## 2020-05-24 DIAGNOSIS — I4891 Unspecified atrial fibrillation: Secondary | ICD-10-CM | POA: Diagnosis not present

## 2020-05-24 DIAGNOSIS — Z85828 Personal history of other malignant neoplasm of skin: Secondary | ICD-10-CM | POA: Diagnosis not present

## 2020-05-24 LAB — CBC WITH DIFFERENTIAL/PLATELET
Abs Immature Granulocytes: 0.08 10*3/uL — ABNORMAL HIGH (ref 0.00–0.07)
Basophils Absolute: 0 10*3/uL (ref 0.0–0.1)
Basophils Relative: 0 %
Eosinophils Absolute: 0.1 10*3/uL (ref 0.0–0.5)
Eosinophils Relative: 1 %
HCT: 39.7 % (ref 39.0–52.0)
Hemoglobin: 12.2 g/dL — ABNORMAL LOW (ref 13.0–17.0)
Immature Granulocytes: 1 %
Lymphocytes Relative: 15 %
Lymphs Abs: 1.3 10*3/uL (ref 0.7–4.0)
MCH: 28.2 pg (ref 26.0–34.0)
MCHC: 30.7 g/dL (ref 30.0–36.0)
MCV: 91.7 fL (ref 80.0–100.0)
Monocytes Absolute: 0.9 10*3/uL (ref 0.1–1.0)
Monocytes Relative: 10 %
Neutro Abs: 6.5 10*3/uL (ref 1.7–7.7)
Neutrophils Relative %: 73 %
Platelets: 219 10*3/uL (ref 150–400)
RBC: 4.33 MIL/uL (ref 4.22–5.81)
RDW: 22.3 % — ABNORMAL HIGH (ref 11.5–15.5)
WBC: 8.9 10*3/uL (ref 4.0–10.5)
nRBC: 0 % (ref 0.0–0.2)

## 2020-05-24 LAB — COMPREHENSIVE METABOLIC PANEL
ALT: 16 U/L (ref 0–44)
AST: 22 U/L (ref 15–41)
Albumin: 4.2 g/dL (ref 3.5–5.0)
Alkaline Phosphatase: 77 U/L (ref 38–126)
Anion gap: 6 (ref 5–15)
BUN: 22 mg/dL (ref 8–23)
CO2: 29 mmol/L (ref 22–32)
Calcium: 9.2 mg/dL (ref 8.9–10.3)
Chloride: 102 mmol/L (ref 98–111)
Creatinine, Ser: 0.92 mg/dL (ref 0.61–1.24)
GFR, Estimated: >60 mL/min (ref >60)
Glucose, Bld: 92 mg/dL (ref 70–99)
Potassium: 4 mmol/L (ref 3.5–5.1)
Sodium: 137 mmol/L (ref 135–145)
Total Bilirubin: 0.7 mg/dL (ref 0.3–1.2)
Total Protein: 7.7 g/dL (ref 6.5–8.1)

## 2020-05-24 LAB — PSA: Prostatic Specific Antigen: 0.25 ng/mL (ref 0.00–4.00)

## 2020-05-24 MED ORDER — DENOSUMAB 120 MG/1.7ML ~~LOC~~ SOLN
120.0000 mg | Freq: Once | SUBCUTANEOUS | Status: AC
  Administered 2020-05-24: 120 mg via SUBCUTANEOUS

## 2020-05-24 NOTE — Patient Instructions (Signed)
[place] at [facility] Discharge Instructions  You were seen today by Dr. Delton Coombes. He went over your recent results. You received your Xgeva injection today. Stop taking the Zytiga for 3 weeks and see if your leg pain persists; continue taking prednisone daily. Take the calcium once daily. Dr. Delton Coombes will see you back in 3 weeks for follow up.   Thank you for choosing [place] at [facility] to provide your oncology and hematology care.  To afford each patient quality time with our provider, please arrive at least 15 minutes before your scheduled appointment time.   If you have a lab appointment with the [facility] please come in thru the Main Entrance and check in at the main information desk  You need to re-schedule your appointment should you arrive 10 or more minutes late.  We strive to give you quality time with our providers, and arriving late affects you and other patients whose appointments are after yours.  Also, if you no show three or more times for appointments you may be dismissed from the clinic at the providers discretion.     Again, thank you for choosing [facility].  Our hope is that these requests will decrease the amount of time that you wait before being seen by our physicians.       _____________________________________________________________  Should you have questions after your visit to [facility], please contact our office at [phone] between the hours of 8:00 a.m. and 4:30 p.m.  Voicemails left after 4:00 p.m. will not be returned until the following business day.  For prescription refill requests, have your pharmacy contact our office and allow 72 hours.    Cancer Center Support Programs:   > Cancer Support Group  2nd Tuesday of the month 1pm-2pm, Journey Room

## 2020-05-24 NOTE — Progress Notes (Signed)
Steven Gutierrez presents today for injection per the provider's orders.  Xgeva administration without incident; injection site WNL; see MAR for injection details.  Patient tolerated procedure well and without incident.  No questions or complaints noted at this time. Discharged ambulatory in stable condition.

## 2020-05-24 NOTE — Progress Notes (Signed)
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    CANCER STAGING: Cancer Staging No matching staging information was found for the patient.  INTERVAL HISTORY:  [name], a 86 y.o. male, returns for routine follow-up of his metastatic castration-sensitive prostate cancer. Kristy was last seen on 03/29/2020.   Today he reports feeling good. His biggest complaint is pain in his right leg which started about 3 months ago. He went to the orthopedist in [place] and had it x-rayed. He complains of the pain interfering with his sleep. He supposes that the pain in his right leg is driven by his Zytiga. He takes 2 tablets of Norco at night and 2 tablets of Tylenol during the day if the pain is bad. He is taking 3 tablets of Zytiga daily. He denies N/V/D. His energy levels are at 80% and holding steady.   REVIEW OF SYSTEMS:  Review of Systems  Constitutional: Positive for fatigue (80%). Negative for appetite change.  Respiratory: Positive for cough.   Gastrointestinal: Negative for diarrhea, nausea and vomiting.  Genitourinary: Positive for bladder incontinence and frequency.   Musculoskeletal: Positive for arthralgias (10/10 R hip pain to leg).  Neurological: Positive for dizziness (upon bending).  Psychiatric/Behavioral: Positive for sleep disturbance (d/t R leg pain).  All other systems reviewed and are negative.   PAST MEDICAL/SURGICAL HISTORY:  Past Medical History:  Diagnosis Date  . A-fib ([place])   . Cancer ([place])    skin cancer  . Glaucoma 11/17/2019    . Gout 11/17/2019  . Hypertension   . Hypothyroidism 11/17/2019   Past Surgical History:  Procedure Laterality Date  . ESOPHAGOGASTRODUODENOSCOPY  03/2018  . TRANSURETHRAL RESECTION OF PROSTATE  04/21/2019    SOCIAL HISTORY:  Social History   Socioeconomic History  . Marital status: Widowed    Spouse name: Not on file  . Number of children: 2  . Years of education: Not on file  . Highest education level: Not on file  Occupational History  . Occupation: Retired  Tobacco Use  . Smoking status: Former Smoker    Packs/day: 0.50    Types: Cigarettes  . Smokeless tobacco: Never Used  Vaping Use  . Vaping Use: Never used  Substance and Sexual Activity  . Alcohol use: Yes    Alcohol/week: 1.0 standard drink    Types: 1 Glasses of wine per week    Comment: occ  . Drug use: Never  . Sexual activity: Not Currently  Other Topics Concern  . Not on file  Social History Narrative  . Not on file   Social Determinants of Health   Financial Resource Strain: Low Risk   . Difficulty of Paying Living Expenses: Not hard at all  Food Insecurity: No Food Insecurity  . Worried About Charity fundraiser in the Last Year: Never true  . Ran Out of Food in the Last Year: Never true  Transportation Needs: No Transportation Needs  . Lack of Transportation (Medical): No  . Lack of Transportation (Non-Medical): No  Physical Activity: Insufficiently Active  . Days of Exercise per Week:  3 days  . Minutes of Exercise per Session: 30 min  Stress: No Stress Concern Present  . Feeling of Stress : Not at all  Social Connections: Moderately Isolated  . Frequency of Communication with Friends and Family: More than three times a week  . Frequency of Social Gatherings with Friends and Family: More than three times a week  . Attends Religious Services: More than 4 times per year  . Active Member of Clubs or Organizations: No  . Attends Archivist Meetings: Never  . Marital Status: Widowed   Intimate Partner Violence: Not At Risk  . Fear of Current or Ex-Partner: No  . Emotionally Abused: No  . Physically Abused: No  . Sexually Abused: No    FAMILY HISTORY:  Family History  Problem Relation Age of Onset  . Heart attack Mother   . Stroke Father   . Heart attack Sister   . Lung cancer Brother     CURRENT MEDICATIONS:  Current Outpatient Medications  Medication Sig Dispense Refill  . abiraterone acetate (ZYTIGA) 250 MG tablet Take 3 tablets (750 mg total) by mouth daily. Take on an empty stomach 1 hour before or 2 hours after a meal 90 tablet 2  . acetaminophen (TYLENOL) 500 MG tablet Take 1,000 mg by mouth daily as needed for fever (for pain.).     Marland Kitchen allopurinol (ZYLOPRIM) 300 MG tablet Take 300 mg by mouth daily.    . cholecalciferol (VITAMIN D3) 25 MCG (1000 UNIT) tablet Take 1,000 Units by mouth daily.    Marland Kitchen docusate sodium (PHILLIPS STOOL SOFTENER) 100 MG capsule Take 100 mg by mouth daily as needed for mild constipation.    Marland Kitchen HYDROcodone-acetaminophen (NORCO/VICODIN) 5-325 MG tablet Take 2 tablets by mouth at bedtime as needed. 60 tablet 0  . levothyroxine (SYNTHROID) 50 MCG tablet Take 50 mcg by mouth daily before breakfast.    . lidocaine (LIDODERM) 5 % Place 1 patch onto the skin daily. Remove & Discard patch within 12 hours or as directed by MD 30 patch 0  . mirabegron ER (MYRBETRIQ) 25 MG TB24 tablet Take 1 tablet (25 mg total) by mouth daily. 30 tablet 2  . Multiple Vitamin (MULTIVITAMIN WITH MINERALS) TABS tablet Take 1 tablet by mouth daily.    . ondansetron (ZOFRAN ODT) 4 MG disintegrating tablet Take 1 tablet (4 mg total) by mouth every 8 (eight) hours as needed for nausea or vomiting. 20 tablet 0  . pantoprazole (PROTONIX) 40 MG tablet Take 40 mg by mouth daily.     . predniSONE (DELTASONE) 5 MG tablet Take 1 tablet (5 mg total) by mouth daily with breakfast. 30 tablet 6  . rivaroxaban (XARELTO) 20 MG TABS tablet Take 20 mg by mouth in the morning.     .  timolol (BETIMOL) 0.5 % ophthalmic solution Place 1 drop into the left eye in the morning.    Marland Kitchen amLODipine (NORVASC) 5 MG tablet Take 1 tablet (5 mg total) by mouth daily. 30 tablet 3   No current facility-administered medications for this visit.    ALLERGIES:  Allergies  Allergen Reactions  . Oxycodone Nausea And Vomiting    PHYSICAL EXAM:  Performance status (ECOG): 1 - Symptomatic but completely ambulatory  Vitals:   05/24/20 1130  BP: 133/68  Pulse: (!) 56  Resp: 18  Temp: (!) 96.6 F (35.9 C)  SpO2: 97%   Wt Readings from Last 3 Encounters:  05/24/20 210 lb 4.8 oz (95.4 kg)  03/29/20  207 lb 3.2 oz (94 kg)  03/01/20 206 lb 12.7 oz (93.8 kg)   Physical Exam Vitals reviewed.  Constitutional:      Appearance: Normal appearance.  Cardiovascular:     Rate and Rhythm: Normal rate and regular rhythm.     Pulses: Normal pulses.     Heart sounds: Normal heart sounds.  Pulmonary:     Effort: Pulmonary effort is normal.     Breath sounds: Normal breath sounds.  Musculoskeletal:     Right upper leg: No tenderness or bony tenderness.  Neurological:     General: No focal deficit present.     Mental Status: He is alert and oriented to person, place, and time.  Psychiatric:        Mood and Affect: Mood normal.        Behavior: Behavior normal.      LABORATORY DATA:  I have reviewed the labs as listed.  CBC Latest Ref Rng & Units 05/24/2020 04/26/2020 03/29/2020  WBC 4.0 - 10.5 K/uL 8.9 8.9 6.7  Hemoglobin 13.0 - 17.0 g/dL 12.2(L) 10.9(L) 8.8(L)  Hematocrit 39 - 52 % 39.7 36.9(L) 30.4(L)  Platelets 150 - 400 K/uL 219 208 203   CMP Latest Ref Rng & Units 05/24/2020 04/26/2020 03/29/2020  Glucose 70 - 99 mg/dL 92 109(H) 102(H)  BUN 8 - 23 mg/dL 22 23 22   Creatinine 0.61 - 1.24 mg/dL 0.92 0.90 0.88  Sodium 135 - 145 mmol/L 137 138 138  Potassium 3.5 - 5.1 mmol/L 4.0 4.3 4.1  Chloride 98 - 111 mmol/L 102 103 103  CO2 22 - 32 mmol/L 29 27 26   Calcium 8.9 - 10.3 mg/dL 9.2  8.7(L) 9.3  Total Protein 6.5 - 8.1 g/dL 7.7 7.3 7.5  Total Bilirubin 0.3 - 1.2 mg/dL 0.7 0.6 0.4  Alkaline Phos 38 - 126 U/L 77 76 71  AST 15 - 41 U/L 22 23 18   ALT 0 - 44 U/L 16 16 12    PSA 0.30 04/26/2020  PSA 0.22 03/29/2020  PSA 0.30 03/01/2020    DIAGNOSTIC IMAGING:  I have independently reviewed the scans and discussed with the patient. No results found.   ASSESSMENT:  1. 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 Titus Mould in [place] around September 2020 with prostate cancer. -Presentation with low back pain to the ER on 10/21/2019 with MRI of the lumbar spine showing bone metastasis involving L2, T12 vertebral bodies with no extraosseous tumor. Extensive bulky retroperitoneal adenopathy. -PSA was 390. PET scan on 11/10/2019 showed nodal metastasis involving chest, abdomen and pelvis. Bone metastasis including destructive soft tissue mass involving right posterior column of the acetabulum. -Left external iliac lymph node biopsy on 11/18/2019 consistent with prostatic adenocarcinoma. -Firmagon started on 11/24/2019. Abiraterone with prednisone started around 12/10/2019.   PLAN:  1. 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  He is taking at nighttime. -He reported worsening pain in the right knee and right posterior thigh in the last 2 to 3 weeks.  He had right knee pain on and off for the last 3 months. -Also reported some pain in the right ankle and right hip. -Recommend holding Abiraterone and reevaluate in 3 weeks. -If there is complete resolution of the pain, will consider decreasing dose of Abiraterone to 500 mg daily or starting him on androgen receptor blocker. -His PSA has improved to 0.3.  2.  Low back pain: -Completely resolved since the start of Firmagon and Abiraterone.  Has very mild pains occasionally.  3. Atrial  fibrillation: -Continue Xarelto.  No bleeding issues.  4. Bone metastasis: -Continue Xgeva.  He will cut back on calcium to 1 tablet daily.  5.  Normocytic anemia: -Received Feraheme on 04/26/2020.  Hemoglobin improved to 12.2.  Energy levels also improved.   Orders placed this encounter:  No orders of the defined types were placed in this encounter.    Derek Jack, MD [place] [phone]   I, Milinda Antis, am acting as a scribe for Dr. Sanda Linger.  I, Derek Jack MD, have reviewed the above documentation for accuracy and completeness, and I agree with the above.

## 2020-06-07 ENCOUNTER — Encounter (HOSPITAL_COMMUNITY): Payer: Self-pay

## 2020-06-07 NOTE — Progress Notes (Signed)
Phone call received from patient's son, [name] [phone], who reports increased pain and weakness of the patient's lower back and right leg. Patient's son reports intermittent numbness of the toes on that side as well. Patient's son does endorse that the patient has been seen by an orthopedist and that there was nothing noted during that visit. Son is requesting additional work-up or referrals at this time. Dr. Delton Coombes made aware, awaiting further instructions.

## 2020-06-08 ENCOUNTER — Ambulatory Visit (HOSPITAL_COMMUNITY)
Admission: RE | Admit: 2020-06-08 | Discharge: 2020-06-08 | Disposition: A | Discharge status: Home / Self Care | Payer: Medicare Other | Source: Ambulatory Visit | Attending: Hematology | Admitting: Hematology

## 2020-06-08 ENCOUNTER — Other Ambulatory Visit: Payer: Self-pay

## 2020-06-08 ENCOUNTER — Other Ambulatory Visit (HOSPITAL_COMMUNITY): Payer: Self-pay

## 2020-06-08 DIAGNOSIS — C7951 Secondary malignant neoplasm of bone: Secondary | ICD-10-CM

## 2020-06-08 DIAGNOSIS — C61 Malignant neoplasm of prostate: Secondary | ICD-10-CM | POA: Insufficient documentation

## 2020-06-08 DIAGNOSIS — Z191 Hormone sensitive malignancy status: Secondary | ICD-10-CM

## 2020-06-08 MED ORDER — GADOBUTROL 1 MMOL/ML IV SOLN
10.0000 mL | Freq: Once | INTRAVENOUS | Status: AC | PRN
  Administered 2020-06-08: 10 mL via INTRAVENOUS

## 2020-06-08 NOTE — Progress Notes (Signed)
STAT MRI Lumbar and Thoracic Spine orders placed per Dr. Delton Coombes

## 2020-06-09 ENCOUNTER — Encounter (HOSPITAL_COMMUNITY): Payer: Self-pay

## 2020-06-09 NOTE — Progress Notes (Signed)
Patient MRI results given to Dr. Delton Coombes. Neurology referral placed per Dr. Tomie China verbal order.

## 2020-06-10 ENCOUNTER — Encounter (HOSPITAL_COMMUNITY): Payer: Self-pay | Admitting: Lab

## 2020-06-10 NOTE — Progress Notes (Unsigned)
Referral sent to Dr Merlene Laughter .  Records faxed on 11/12

## 2020-06-14 ENCOUNTER — Inpatient Hospital Stay (HOSPITAL_COMMUNITY): Payer: Medicare Other | Attending: Hematology | Admitting: Hematology

## 2020-06-14 ENCOUNTER — Other Ambulatory Visit: Payer: Self-pay

## 2020-06-14 ENCOUNTER — Other Ambulatory Visit (HOSPITAL_COMMUNITY): Payer: Self-pay

## 2020-06-14 VITALS — BP 134/65 | HR 60 | Temp 97.8°F | Resp 18 | Wt 217.0 lb

## 2020-06-14 DIAGNOSIS — Z87891 Personal history of nicotine dependence: Secondary | ICD-10-CM | POA: Insufficient documentation

## 2020-06-14 DIAGNOSIS — Z191 Hormone sensitive malignancy status: Secondary | ICD-10-CM | POA: Diagnosis not present

## 2020-06-14 DIAGNOSIS — C7951 Secondary malignant neoplasm of bone: Secondary | ICD-10-CM | POA: Insufficient documentation

## 2020-06-14 DIAGNOSIS — I4891 Unspecified atrial fibrillation: Secondary | ICD-10-CM | POA: Diagnosis not present

## 2020-06-14 DIAGNOSIS — E039 Hypothyroidism, unspecified: Secondary | ICD-10-CM | POA: Diagnosis not present

## 2020-06-14 DIAGNOSIS — Z7901 Long term (current) use of anticoagulants: Secondary | ICD-10-CM | POA: Diagnosis not present

## 2020-06-14 DIAGNOSIS — Z801 Family history of malignant neoplasm of trachea, bronchus and lung: Secondary | ICD-10-CM | POA: Diagnosis not present

## 2020-06-14 DIAGNOSIS — C61 Malignant neoplasm of prostate: Secondary | ICD-10-CM | POA: Insufficient documentation

## 2020-06-14 DIAGNOSIS — M545 Low back pain, unspecified: Secondary | ICD-10-CM | POA: Diagnosis not present

## 2020-06-14 DIAGNOSIS — D649 Anemia, unspecified: Secondary | ICD-10-CM | POA: Diagnosis not present

## 2020-06-14 DIAGNOSIS — Z8249 Family history of ischemic heart disease and other diseases of the circulatory system: Secondary | ICD-10-CM | POA: Insufficient documentation

## 2020-06-14 DIAGNOSIS — C779 Secondary and unspecified malignant neoplasm of lymph node, unspecified: Secondary | ICD-10-CM | POA: Insufficient documentation

## 2020-06-14 DIAGNOSIS — I10 Essential (primary) hypertension: Secondary | ICD-10-CM | POA: Insufficient documentation

## 2020-06-14 DIAGNOSIS — Z79899 Other long term (current) drug therapy: Secondary | ICD-10-CM | POA: Insufficient documentation

## 2020-06-14 MED ORDER — METHYLPREDNISOLONE SODIUM SUCC 125 MG IJ SOLR
60.0000 mg | Freq: Once | INTRAMUSCULAR | Status: AC
  Administered 2020-06-14: 60 mg via INTRAMUSCULAR
  Filled 2020-06-14: qty 2

## 2020-06-14 MED ORDER — BOOST PO LIQD: 237.0000 mL | Freq: Two times a day (BID) | ORAL | 3 refills | Status: DC

## 2020-06-14 NOTE — Progress Notes (Signed)
Patient is taking zytiga as prescribed and denies any side effects.  Steven Gutierrez presents today for injection per the provider's orders.  Solumedrol 60mg  administration without incident; injection site WNL; see MAR for injection details.  Patient tolerated procedure well and without incident.  No questions or complaints noted at this time. Patient discharged ambulatory with family member.

## 2020-06-14 NOTE — Progress Notes (Signed)
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    CANCER STAGING: Cancer Staging No matching staging information was found for the patient.  INTERVAL HISTORY:  [name], a 86 y.o. male, returns for routine follow-up of his metastatic castration-sensitive prostate cancer. Steven Gutierrez was last seen on 05/24/2020.   Today he is accompanied by his son and he reports feeling okay. He has not been taking the Zytiga for the past 3 weeks and denies having any improvement in the right knee and lower leg pain. The lower back pain extends along the back of his thigh and along the calf to the foot; the discomfort is constant and the excruciating pain comes in waves. His gait is off and continues having difficulty getting in and out of the car, but has not fallen recently. The pain is allowing him to sleep only 4 hours at night; he puts a lidocaine patch on his back which mildly helps. He continues taking prednisone every morning with breakfast. He drinks Ensure when he skips a meal. He saw his PCP in [place] and had an x-ray of his right knee done and a cortisol injection into his back which helped temporarily. He reports having urinary frequency at night and some urinary incontinence necessitating him to wear Depends. He reports having occasional constipation and takes Colace intermittently.   REVIEW OF SYSTEMS:  Review of Systems  Constitutional: Positive for fatigue (75%). Negative for appetite change.  Gastrointestinal: Positive for constipation (intermittently).   Genitourinary: Positive for bladder incontinence, frequency and nocturia.   Musculoskeletal: Positive for back pain (3/10-8/10 lower back and R leg pain).  Neurological: Positive for dizziness (upon bending) and numbness (tingling in toes).  Psychiatric/Behavioral: Positive for sleep disturbance (d/t pain).  All other systems reviewed and are negative.   PAST MEDICAL/SURGICAL HISTORY:  Past Medical History:  Diagnosis Date  . A-fib ([place])   . Cancer ([place])    skin cancer  . Glaucoma 11/17/2019  . Gout 11/17/2019  . Hypertension   . Hypothyroidism 11/17/2019   Past Surgical History:  Procedure Laterality Date  . ESOPHAGOGASTRODUODENOSCOPY  03/2018  . TRANSURETHRAL RESECTION OF PROSTATE  04/21/2019    SOCIAL HISTORY:  Social History   Socioeconomic History  . Marital status: Widowed    Spouse name: Not on file  . Number of children: 2  . Years of education: Not on file  . Highest education level: Not on file  Occupational History  . Occupation: Retired  Tobacco Use  . Smoking status: Former Smoker    Packs/day: 0.50    Types: Cigarettes  . Smokeless tobacco: Never Used  Vaping Use  . Vaping Use: Never used  Substance and Sexual Activity  . Alcohol use: Yes    Alcohol/week: 1.0 standard drink    Types: 1 Glasses of wine per week    Comment: occ  . Drug use: Never  . Sexual activity: Not Currently  Other Topics Concern  . Not on file  Social History Narrative  . Not on file   Social Determinants of Health   Financial Resource Strain: Low  Risk   . Difficulty of Paying Living Expenses: Not hard at all  Food Insecurity: No Food Insecurity  . Worried About Charity fundraiser in the Last Year: Never true  . Ran Out of Food in the Last Year: Never true  Transportation Needs: No Transportation Needs  . Lack of Transportation (Medical): No  . Lack of Transportation (Non-Medical): No  Physical Activity: Insufficiently Active  . Days of Exercise per Week: 3 days  .  Minutes of Exercise per Session: 30 min  Stress: No Stress Concern Present  . Feeling of Stress : Not at all  Social Connections: Moderately Isolated  . Frequency of Communication with Friends and Family: More than three times a week  . Frequency of Social Gatherings with Friends and Family: More than three times a week  . Attends Religious Services: More than 4 times per year  . Active Member of Clubs or Organizations: No  . Attends Archivist Meetings: Never  . Marital Status: Widowed  Intimate Partner Violence: Not At Risk  . Fear of Current or Ex-Partner: No  . Emotionally Abused: No  . Physically Abused: No  . Sexually Abused: No    FAMILY HISTORY:  Family History  Problem Relation Age of Onset  . Heart attack Mother   . Stroke Father   . Heart attack Sister   . Lung cancer Brother     CURRENT MEDICATIONS:  Current Outpatient Medications  Medication Sig Dispense Refill  . abiraterone acetate (ZYTIGA) 250 MG tablet Take 3 tablets (750 mg total) by mouth daily. Take on an empty stomach 1 hour before or 2 hours after a meal 90 tablet 2  . acetaminophen (TYLENOL) 500 MG tablet Take 1,000 mg by mouth daily as needed for fever (for pain.).     Marland Kitchen allopurinol (ZYLOPRIM) 300 MG tablet Take 300 mg by mouth daily.    . cholecalciferol (VITAMIN D3) 25 MCG (1000 UNIT) tablet Take 1,000 Units by mouth daily.    Marland Kitchen docusate sodium (PHILLIPS STOOL SOFTENER) 100 MG capsule Take 100 mg by mouth daily as needed for mild constipation.    Marland Kitchen levothyroxine (SYNTHROID) 50 MCG tablet Take 50 mcg by mouth daily before breakfast.    . lidocaine (LIDODERM) 5 % Place 1 patch onto the skin daily. Remove & Discard patch within 12 hours or as directed by MD 30 patch 0  . Multiple Vitamin (MULTIVITAMIN WITH MINERALS) TABS tablet Take 1 tablet by mouth daily.    . pantoprazole (PROTONIX) 40 MG tablet Take 40 mg by mouth daily.     . predniSONE (DELTASONE) 5 MG tablet Take 1 tablet (5 mg total) by  mouth daily with breakfast. 30 tablet 6  . rivaroxaban (XARELTO) 20 MG TABS tablet Take 20 mg by mouth in the morning.     . timolol (BETIMOL) 0.5 % ophthalmic solution Place 1 drop into the left eye in the morning.    Marland Kitchen amLODipine (NORVASC) 5 MG tablet Take 1 tablet (5 mg total) by mouth daily. 30 tablet 3  . ondansetron (ZOFRAN ODT) 4 MG disintegrating tablet Take 1 tablet (4 mg total) by mouth every 8 (eight) hours as needed for nausea or vomiting. (Patient not taking: Reported on 06/14/2020) 20 tablet 0   Current Facility-Administered Medications  Medication Dose Route Frequency Provider Last Rate Last Admin  . methylPREDNISolone sodium succinate (SOLU-MEDROL) 125 mg/2 mL injection 60 mg  60 mg Intramuscular Once Derek Jack, MD  ALLERGIES:  Allergies  Allergen Reactions  . Oxycodone Nausea And Vomiting    PHYSICAL EXAM:  Performance status (ECOG): 1 - Symptomatic but completely ambulatory  Vitals:   06/14/20 0957  BP: 134/65  Pulse: 60  Resp: 18  Temp: 97.8 F (36.6 C)  SpO2: 100%   Wt Readings from Last 3 Encounters:  06/14/20 217 lb (98.4 kg)  05/24/20 210 lb 4.8 oz (95.4 kg)  03/29/20 207 lb 3.2 oz (94 kg)   Physical Exam Vitals reviewed.  Constitutional:      Appearance: Normal appearance.  Cardiovascular:     Rate and Rhythm: Normal rate and regular rhythm.     Pulses: Normal pulses.     Heart sounds: Normal heart sounds.  Pulmonary:     Effort: Pulmonary effort is normal.     Breath sounds: Normal breath sounds.  Neurological:     General: No focal deficit present.     Mental Status: He is alert and oriented to person, place, and time.     Motor: Weakness (3/5 in L hip flexion and L leg extension; 5/5 in R hip flexion and R leg extension) present.  Psychiatric:        Mood and Affect: Mood normal.        Behavior: Behavior normal.      LABORATORY DATA:  I have reviewed the labs as listed.  CBC Latest Ref Rng & Units 05/24/2020  04/26/2020 03/29/2020  WBC 4.0 - 10.5 K/uL 8.9 8.9 6.7  Hemoglobin 13.0 - 17.0 g/dL 12.2(L) 10.9(L) 8.8(L)  Hematocrit 39 - 52 % 39.7 36.9(L) 30.4(L)  Platelets 150 - 400 K/uL 219 208 203   CMP Latest Ref Rng & Units 05/24/2020 04/26/2020 03/29/2020  Glucose 70 - 99 mg/dL 92 109(H) 102(H)  BUN 8 - 23 mg/dL 22 23 22   Creatinine 0.61 - 1.24 mg/dL 0.92 0.90 0.88  Sodium 135 - 145 mmol/L 137 138 138  Potassium 3.5 - 5.1 mmol/L 4.0 4.3 4.1  Chloride 98 - 111 mmol/L 102 103 103  CO2 22 - 32 mmol/L 29 27 26   Calcium 8.9 - 10.3 mg/dL 9.2 8.7(L) 9.3  Total Protein 6.5 - 8.1 g/dL 7.7 7.3 7.5  Total Bilirubin 0.3 - 1.2 mg/dL 0.7 0.6 0.4  Alkaline Phos 38 - 126 U/L 77 76 71  AST 15 - 41 U/L 22 23 18   ALT 0 - 44 U/L 16 16 12     DIAGNOSTIC IMAGING:  I have independently reviewed the scans and discussed with the patient. MR THORACIC SPINE W WO CONTRAST  Result Date: 06/08/2020 CLINICAL DATA:  Prostate cancer, assess treatment response. EXAM: MRI THORACIC AND LUMBAR SPINE WITHOUT AND WITH CONTRAST TECHNIQUE: Multiplanar and multiecho pulse sequences of the thoracic and lumbar spine were obtained without and with intravenous contrast. CONTRAST:  55mL GADAVIST GADOBUTROL 1 MMOL/ML IV SOLN COMPARISON:  MRI lumbar spine March 11/17/2019. FINDINGS: MRI THORACIC SPINE FINDINGS Alignment: Mild exaggerated thoracic kyphosis without substantial subluxation. Vertebrae: Mild T3 vertebral body height loss, likely degenerative or remote given the absence of bone marrow edema. Otherwise, vertebral body heights are maintained. Slightly decreased conspicuity of T1 hypointensity involving the left T12 pedicle, which was characterized as an osseous metastatic lesion on prior MRI. New focal area of T1 hypointensity in the superior T12 endplate (series 19, image 15), possibly new metastatic lesion. Cord:  Normal cord signal. Paraspinal and other soft tissues: Probable right renal exophytic cysts, partially imaged. Disc levels:  Mild multilevel degenerative change without significant  canal or foraminal stenosis. MRI LUMBAR SPINE FINDINGS Segmentation: Most inferior fully formed intervertebral disc is labeled L5-S1. Alignment: Similar approximately 3 mm of anterolisthesis of L4 on L5. Similar trace anterolisthesis of L5 on S1. Vertebrae: No substantial change in abnormal T1 hypointensity and STIR hyperintensity involving the L2 vertebral body with patchy enhancement, compatible with metastatic lesion. Additionally, similar metastatic lesion within the posterior element of T12 on the left. There are multi will new T1 hypointense enhancing osseous metastatic lesions. This includes multiple lesions within the L4 vertebral body, small lesion in the superior L5 endplate (series 6, image 10) and punctate lesion within the S2 vertebral body (series 6, image 10). Heterogeneously enhancing osseous metastatic lesion involving the T9 vertebral body, extending into the right pedicle. Small nonenhancing T1 hypointense lesions within the superior T3 endplate and N39 vertebral body. Conus medullaris: Extends to the L1 level and appears normal. Paraspinal and other soft tissues: Apparent moderate hydronephrosis of the right kidney, suboptimally evaluated. The proximal right ureter is also dilated. Disc levels: T12-L1: Only imaged on sagittal sequences without evidence of significant canal or foraminal stenosis. L1-L2: No significant disc protrusion, foraminal stenosis, or canal stenosis. L2-L3: No significant disc protrusion, foraminal stenosis, or canal stenosis. L3-L4: No significant disc protrusion, foraminal stenosis, or canal stenosis. L4-L5: Broad-based disc bulge, moderate bilateral facet hypertrophy and ligamentum flavum thickening. Similar moderate to severe central canal stenosis and severe bilateral subarticular recess stenosis. No significant foraminal stenosis. L5-S1: Bilateral facet hypertrophy without significant canal or foraminal stenosis.  IMPRESSION: MRI thoracic spine: 1. Slightly decreased conspicuity of T12 osseous metastatic lesion involving the left pedicle, which was imaged on prior MRI lumbar spine. Possible small new lesion involving the T12 superior endplate. 2. Heterogeneously enhancing osseous metastatic lesion involving the T9 vertebral body. 3. Additional small nonenhancing T1 hypointense lesions throughout the thoracic spine are nonspecific, but warrant attention on follow-up given they could represent treated metastases. 4. No evidence of epidural involvement of tumor. MRI lumbar spine: 1. No substantial change in osseous metastatic lesions involving L2 vertebral body. 2. Multifocal new enhancing osseous metastatic lesions involving the L4, L5 and S2 vertebral bodies, as detailed above. 3. Moderate right hydroureteronephrosis, which appears new from prior CT abdomen and pelvis. CT abdomen and pelvis could further evaluate if clinically indicated. 4. Similar multilevel degenerative change with moderate to severe central canal and severe bilateral subarticular recess stenosis at L4-L5. 5. No evidence epidural involvement of tumor. Electronically Signed   By: Margaretha Sheffield MD   On: 06/08/2020 14:08   MR Lumbar Spine W Wo Contrast  Result Date: 06/08/2020 CLINICAL DATA:  Prostate cancer, assess treatment response. EXAM: MRI THORACIC AND LUMBAR SPINE WITHOUT AND WITH CONTRAST TECHNIQUE: Multiplanar and multiecho pulse sequences of the thoracic and lumbar spine were obtained without and with intravenous contrast. CONTRAST:  91mL GADAVIST GADOBUTROL 1 MMOL/ML IV SOLN COMPARISON:  MRI lumbar spine March 11/17/2019. FINDINGS: MRI THORACIC SPINE FINDINGS Alignment: Mild exaggerated thoracic kyphosis without substantial subluxation. Vertebrae: Mild T3 vertebral body height loss, likely degenerative or remote given the absence of bone marrow edema. Otherwise, vertebral body heights are maintained. Slightly decreased conspicuity of T1  hypointensity involving the left T12 pedicle, which was characterized as an osseous metastatic lesion on prior MRI. New focal area of T1 hypointensity in the superior T12 endplate (series 19, image 15), possibly new metastatic lesion. Cord:  Normal cord signal. Paraspinal and other soft tissues: Probable right renal exophytic cysts, partially imaged. Disc levels: Mild multilevel degenerative  change without significant canal or foraminal stenosis. MRI LUMBAR SPINE FINDINGS Segmentation: Most inferior fully formed intervertebral disc is labeled L5-S1. Alignment: Similar approximately 3 mm of anterolisthesis of L4 on L5. Similar trace anterolisthesis of L5 on S1. Vertebrae: No substantial change in abnormal T1 hypointensity and STIR hyperintensity involving the L2 vertebral body with patchy enhancement, compatible with metastatic lesion. Additionally, similar metastatic lesion within the posterior element of T12 on the left. There are multi will new T1 hypointense enhancing osseous metastatic lesions. This includes multiple lesions within the L4 vertebral body, small lesion in the superior L5 endplate (series 6, image 10) and punctate lesion within the S2 vertebral body (series 6, image 10). Heterogeneously enhancing osseous metastatic lesion involving the T9 vertebral body, extending into the right pedicle. Small nonenhancing T1 hypointense lesions within the superior T3 endplate and D78 vertebral body. Conus medullaris: Extends to the L1 level and appears normal. Paraspinal and other soft tissues: Apparent moderate hydronephrosis of the right kidney, suboptimally evaluated. The proximal right ureter is also dilated. Disc levels: T12-L1: Only imaged on sagittal sequences without evidence of significant canal or foraminal stenosis. L1-L2: No significant disc protrusion, foraminal stenosis, or canal stenosis. L2-L3: No significant disc protrusion, foraminal stenosis, or canal stenosis. L3-L4: No significant disc  protrusion, foraminal stenosis, or canal stenosis. L4-L5: Broad-based disc bulge, moderate bilateral facet hypertrophy and ligamentum flavum thickening. Similar moderate to severe central canal stenosis and severe bilateral subarticular recess stenosis. No significant foraminal stenosis. L5-S1: Bilateral facet hypertrophy without significant canal or foraminal stenosis. IMPRESSION: MRI thoracic spine: 1. Slightly decreased conspicuity of T12 osseous metastatic lesion involving the left pedicle, which was imaged on prior MRI lumbar spine. Possible small new lesion involving the T12 superior endplate. 2. Heterogeneously enhancing osseous metastatic lesion involving the T9 vertebral body. 3. Additional small nonenhancing T1 hypointense lesions throughout the thoracic spine are nonspecific, but warrant attention on follow-up given they could represent treated metastases. 4. No evidence of epidural involvement of tumor. MRI lumbar spine: 1. No substantial change in osseous metastatic lesions involving L2 vertebral body. 2. Multifocal new enhancing osseous metastatic lesions involving the L4, L5 and S2 vertebral bodies, as detailed above. 3. Moderate right hydroureteronephrosis, which appears new from prior CT abdomen and pelvis. CT abdomen and pelvis could further evaluate if clinically indicated. 4. Similar multilevel degenerative change with moderate to severe central canal and severe bilateral subarticular recess stenosis at L4-L5. 5. No evidence epidural involvement of tumor. Electronically Signed   By: Margaretha Sheffield MD   On: 06/08/2020 14:08     ASSESSMENT:  1. 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 Titus Mould in [place] around September 2020 with prostate cancer. -Presentation with low back pain to the ER on 10/21/2019 with MRI of the lumbar spine showing bone metastasis involving L2, T12 vertebral bodies with no extraosseous tumor. Extensive bulky  retroperitoneal adenopathy. -PSA was 390. PET scan on 11/10/2019 showed nodal metastasis involving chest, abdomen and pelvis. Bone metastasis including destructive soft tissue mass involving right posterior column of the acetabulum. -Left external iliac lymph node biopsy on 11/18/2019 consistent with prostatic adenocarcinoma. -Firmagon started on 11/24/2019. Abiraterone with prednisone started around 12/10/2019.   PLAN:  1. Metastatic castration sensitive prostate cancer to the bones and lymph nodes: -He stopped taking abiraterone 750 mg daily as he was having right leg pain. -Pain did not improve 3 weeks after stopping Abiraterone. -His latest PSA has improved to 0.25 on 05/24/2020.  His LFTs are within normal limits. -I have told him to start back on Abiraterone.  He is already continuing prednisone.  2. Low back pain: -He reported low back pain which is radiating down the right leg, mainly posteriorly.  He also has some pain in the right knee.  He was evaluated by his orthopedic doctor in [place] and x-rays of the right knee did not show any major abnormalities. -He reports occasional weakness in the right leg. -I have done MRI of the thoracic and lumbar spine.  I reviewed reports from 06/08/2020 which did not show any evidence of epidural involvement of tumor.  Lesions in the T12 left pedicle, T12 superior endplate, T9 vertebral body, L2 body, L4-L5 and S2 vertebral bodies.  Similar multilevel degenerative change with moderate to severe central canal and severe bilateral subarticular recess stenosis at L4-L5.  Broad-based disc bulge at L4-L5. -I have recommended consultation with the back specialist. -We will give him Solu-Medrol 60 mg IM today to see if it helps.  3. Atrial fibrillation: -Continue Xarelto.  No bleeding issues.  4. Bone metastasis: -Continue Xgeva.  Calcium level is 9.2.  5. Normocytic anemia: -Hemoglobin improved to 12.2 after Feraheme.   Orders placed this  encounter:  No orders of the defined types were placed in this encounter.    Derek Jack, MD [place] [phone]   I, Milinda Antis, am acting as a scribe for Dr. Sanda Linger.  I, Derek Jack MD, have reviewed the above documentation for accuracy and completeness, and I agree with the above.

## 2020-06-14 NOTE — Patient Instructions (Signed)
[place] at [facility] Discharge Instructions  You were seen today by Dr. Delton Coombes. He went over your recent results and scans. You will get your Xgeva injection next week. Start taking 3 tablets of Zytiga daily. Take Colace daily to prevent constipation and have regular bowel movements. Dr. Delton Coombes will see you back in 9 weeks for labs and follow up.   Thank you for choosing [place] at [facility] to provide your oncology and hematology care.  To afford each patient quality time with our provider, please arrive at least 15 minutes before your scheduled appointment time.   If you have a lab appointment with the [facility] please come in thru the Main Entrance and check in at the main information desk  You need to re-schedule your appointment should you arrive 10 or more minutes late.  We strive to give you quality time with our providers, and arriving late affects you and other patients whose appointments are after yours.  Also, if you no show three or more times for appointments you may be dismissed from the clinic at the providers discretion.     Again, thank you for choosing [facility].  Our hope is that these requests will decrease the amount of time that you wait before being seen by our physicians.       _____________________________________________________________  Should you have questions after your visit to [facility], please contact our office at [phone] between the hours of 8:00 a.m. and 4:30 p.m.  Voicemails left after 4:00 p.m. will not be returned until the following business day.  For prescription refill requests, have your pharmacy contact our office and allow 72 hours.    Cancer Center Support Programs:   > Cancer Support Group  2nd Tuesday of the month 1pm-2pm, Journey Room

## 2020-06-15 ENCOUNTER — Other Ambulatory Visit (HOSPITAL_COMMUNITY): Payer: Self-pay

## 2020-06-15 MED ORDER — BOOST PO LIQD: 237.0000 mL | Freq: Two times a day (BID) | ORAL | 3 refills | Status: AC

## 2020-06-15 NOTE — Telephone Encounter (Signed)
Documentation faxed to [facility] at patient's request. Request sent for patient to receive assistance with nutritional supplements and depend supply.

## 2020-06-16 ENCOUNTER — Encounter (HOSPITAL_COMMUNITY): Payer: Self-pay | Admitting: Lab

## 2020-06-16 NOTE — Progress Notes (Unsigned)
Referral faxed to [facility]. Records faxed on 11/18

## 2020-06-21 ENCOUNTER — Other Ambulatory Visit: Payer: Self-pay

## 2020-06-21 ENCOUNTER — Inpatient Hospital Stay (HOSPITAL_COMMUNITY): Payer: Medicare Other

## 2020-06-21 ENCOUNTER — Encounter (HOSPITAL_COMMUNITY): Payer: Self-pay

## 2020-06-21 VITALS — BP 123/69 | HR 79 | Temp 98.0°F | Resp 17

## 2020-06-21 DIAGNOSIS — C7951 Secondary malignant neoplasm of bone: Secondary | ICD-10-CM

## 2020-06-21 DIAGNOSIS — Z191 Hormone sensitive malignancy status: Secondary | ICD-10-CM

## 2020-06-21 DIAGNOSIS — C61 Malignant neoplasm of prostate: Secondary | ICD-10-CM | POA: Diagnosis not present

## 2020-06-21 LAB — CBC WITH DIFFERENTIAL/PLATELET
Abs Immature Granulocytes: 0.1 10*3/uL — ABNORMAL HIGH (ref 0.00–0.07)
Basophils Absolute: 0.1 10*3/uL (ref 0.0–0.1)
Basophils Relative: 1 %
Eosinophils Absolute: 0.1 10*3/uL (ref 0.0–0.5)
Eosinophils Relative: 1 %
HCT: 38 % — ABNORMAL LOW (ref 39.0–52.0)
Hemoglobin: 12 g/dL — ABNORMAL LOW (ref 13.0–17.0)
Immature Granulocytes: 1 %
Lymphocytes Relative: 9 %
Lymphs Abs: 0.9 10*3/uL (ref 0.7–4.0)
MCH: 29.9 pg (ref 26.0–34.0)
MCHC: 31.6 g/dL (ref 30.0–36.0)
MCV: 94.8 fL (ref 80.0–100.0)
Monocytes Absolute: 1 10*3/uL (ref 0.1–1.0)
Monocytes Relative: 10 %
Neutro Abs: 7.9 10*3/uL — ABNORMAL HIGH (ref 1.7–7.7)
Neutrophils Relative %: 78 %
Platelets: 227 10*3/uL (ref 150–400)
RBC: 4.01 MIL/uL — ABNORMAL LOW (ref 4.22–5.81)
RDW: 19.2 % — ABNORMAL HIGH (ref 11.5–15.5)
WBC: 10.1 10*3/uL (ref 4.0–10.5)
nRBC: 0 % (ref 0.0–0.2)

## 2020-06-21 LAB — COMPREHENSIVE METABOLIC PANEL
ALT: 17 U/L (ref 0–44)
AST: 21 U/L (ref 15–41)
Albumin: 3.9 g/dL (ref 3.5–5.0)
Alkaline Phosphatase: 85 U/L (ref 38–126)
Anion gap: 8 (ref 5–15)
BUN: 19 mg/dL (ref 8–23)
CO2: 28 mmol/L (ref 22–32)
Calcium: 9 mg/dL (ref 8.9–10.3)
Chloride: 103 mmol/L (ref 98–111)
Creatinine, Ser: 1.07 mg/dL (ref 0.61–1.24)
GFR, Estimated: >60 mL/min (ref >60)
Glucose, Bld: 86 mg/dL (ref 70–99)
Potassium: 4.1 mmol/L (ref 3.5–5.1)
Sodium: 139 mmol/L (ref 135–145)
Total Bilirubin: 0.5 mg/dL (ref 0.3–1.2)
Total Protein: 7.4 g/dL (ref 6.5–8.1)

## 2020-06-21 MED ORDER — LEUPROLIDE ACETATE (6 MONTH) 45 MG ~~LOC~~ KIT
45.0000 mg | PACK | Freq: Once | SUBCUTANEOUS | Status: AC
  Administered 2020-06-21: 45 mg via SUBCUTANEOUS
  Filled 2020-06-21: qty 45

## 2020-06-21 MED ORDER — DENOSUMAB 120 MG/1.7ML ~~LOC~~ SOLN
120.0000 mg | Freq: Once | SUBCUTANEOUS | Status: AC
  Administered 2020-06-21: 120 mg via SUBCUTANEOUS
  Filled 2020-06-21: qty 1.7

## 2020-06-21 NOTE — Progress Notes (Signed)
Patient taking calcium as directed.  Denied tooth, jaw, and leg pain.  No recent or upcoming dental visits.  Labs reviewed.  Patient tolerated injections with no complaints voiced.  See MAR for details.  Sites clean and dry with no bruising or swelling noted.  Band aids applied.  Vss with discharge and left ambulatory with no s/s of distress noted.

## 2020-07-05 ENCOUNTER — Encounter (HOSPITAL_COMMUNITY): Payer: Self-pay

## 2020-07-05 ENCOUNTER — Other Ambulatory Visit: Payer: Self-pay | Admitting: Oncology

## 2020-07-05 MED ORDER — TRAMADOL HCL 50 MG PO TABS
50.0000 mg | ORAL_TABLET | Freq: Four times a day (QID) | ORAL | 0 refills | Status: AC | PRN
Start: 2020-07-05 — End: ?

## 2020-07-05 NOTE — Progress Notes (Signed)
Phone call received from patient's son, [name], reporting on going pain. Patient reports a new prescription for gabapentin that he is taking 3 times daily, as prescribed by neurosurgery. Patient reports that helps with his pain some but he remains unable to rest with the pain. Faythe Casa, NP aware and has prescribed tramadol. Patient and son aware of medication regimen and verbalized understanding

## 2020-07-11 ENCOUNTER — Other Ambulatory Visit (HOSPITAL_COMMUNITY): Payer: Self-pay

## 2020-07-11 DIAGNOSIS — Z191 Hormone sensitive malignancy status: Secondary | ICD-10-CM

## 2020-07-11 MED FILL — ABIRATERONE ACETATE 250 MG: 250 | 30 days supply | Qty: 90 | Fill #1

## 2020-07-11 NOTE — Telephone Encounter (Signed)
Message received from patient's son that the patient is almost out of Zytiga. Call placed to pharmacy. Pharmacy to send medication today for delivery tomorrow. I have called the patient but was unable to reach the patient. I have left a detailed VM to let him know that I have spoken to the pharmacy and he should receive his [place] tomorrow.

## 2020-07-19 ENCOUNTER — Other Ambulatory Visit: Payer: Self-pay

## 2020-07-19 ENCOUNTER — Inpatient Hospital Stay (HOSPITAL_COMMUNITY): Payer: Medicare Other | Attending: Hematology and Oncology

## 2020-07-19 ENCOUNTER — Inpatient Hospital Stay (HOSPITAL_COMMUNITY): Payer: Medicare Other

## 2020-07-19 VITALS — BP 114/87 | HR 75 | Temp 98.3°F | Resp 18

## 2020-07-19 DIAGNOSIS — C7951 Secondary malignant neoplasm of bone: Secondary | ICD-10-CM | POA: Diagnosis not present

## 2020-07-19 DIAGNOSIS — C61 Malignant neoplasm of prostate: Secondary | ICD-10-CM

## 2020-07-19 DIAGNOSIS — Z191 Hormone sensitive malignancy status: Secondary | ICD-10-CM

## 2020-07-19 DIAGNOSIS — D649 Anemia, unspecified: Secondary | ICD-10-CM | POA: Insufficient documentation

## 2020-07-19 DIAGNOSIS — C772 Secondary and unspecified malignant neoplasm of intra-abdominal lymph nodes: Secondary | ICD-10-CM | POA: Diagnosis present

## 2020-07-19 LAB — COMPREHENSIVE METABOLIC PANEL
ALT: 14 U/L (ref 0–44)
AST: 21 U/L (ref 15–41)
Albumin: 3.3 g/dL — ABNORMAL LOW (ref 3.5–5.0)
Alkaline Phosphatase: 98 U/L (ref 38–126)
Anion gap: 9 (ref 5–15)
BUN: 15 mg/dL (ref 8–23)
CO2: 24 mmol/L (ref 22–32)
Calcium: 8.3 mg/dL — ABNORMAL LOW (ref 8.9–10.3)
Chloride: 102 mmol/L (ref 98–111)
Creatinine, Ser: 1.07 mg/dL (ref 0.61–1.24)
GFR, Estimated: >60 mL/min (ref >60)
Glucose, Bld: 99 mg/dL (ref 70–99)
Potassium: 4.1 mmol/L (ref 3.5–5.1)
Sodium: 135 mmol/L (ref 135–145)
Total Bilirubin: 0.6 mg/dL (ref 0.3–1.2)
Total Protein: 6.4 g/dL — ABNORMAL LOW (ref 6.5–8.1)

## 2020-07-19 LAB — CBC WITH DIFFERENTIAL/PLATELET
Abs Immature Granulocytes: 0.08 10*3/uL — ABNORMAL HIGH (ref 0.00–0.07)
Basophils Absolute: 0.1 10*3/uL (ref 0.0–0.1)
Basophils Relative: 1 %
Eosinophils Absolute: 0.1 10*3/uL (ref 0.0–0.5)
Eosinophils Relative: 1 %
HCT: 35.7 % — ABNORMAL LOW (ref 39.0–52.0)
Hemoglobin: 11.3 g/dL — ABNORMAL LOW (ref 13.0–17.0)
Immature Granulocytes: 1 %
Lymphocytes Relative: 13 %
Lymphs Abs: 1.3 10*3/uL (ref 0.7–4.0)
MCH: 29.8 pg (ref 26.0–34.0)
MCHC: 31.7 g/dL (ref 30.0–36.0)
MCV: 94.2 fL (ref 80.0–100.0)
Monocytes Absolute: 0.9 10*3/uL (ref 0.1–1.0)
Monocytes Relative: 9 %
Neutro Abs: 7.6 10*3/uL (ref 1.7–7.7)
Neutrophils Relative %: 75 %
Platelets: 308 10*3/uL (ref 150–400)
RBC: 3.79 MIL/uL — ABNORMAL LOW (ref 4.22–5.81)
RDW: 15.7 % — ABNORMAL HIGH (ref 11.5–15.5)
WBC: 10 10*3/uL (ref 4.0–10.5)
nRBC: 0 % (ref 0.0–0.2)

## 2020-07-19 MED ORDER — DENOSUMAB 120 MG/1.7ML ~~LOC~~ SOLN
120.0000 mg | Freq: Once | SUBCUTANEOUS | Status: AC
  Administered 2020-07-19: 120 mg via SUBCUTANEOUS
  Filled 2020-07-19: qty 1.7

## 2020-07-19 NOTE — Progress Notes (Signed)
Patient tolerated Xgeva injection with no complaints voiced.  Site clean and dry with no bruising or swelling noted.  No complaints of pain.  Discharged with vital signs stable and no signs or symptoms of distress noted.  

## 2020-07-31 ENCOUNTER — Emergency Department (HOSPITAL_COMMUNITY)
Admission: EM | Admit: 2020-07-31 | Discharge: 2020-07-31 | Disposition: A | Discharge status: Home / Self Care | Payer: Medicare Other | Attending: Emergency Medicine | Admitting: Emergency Medicine

## 2020-07-31 ENCOUNTER — Encounter (HOSPITAL_COMMUNITY): Payer: Self-pay | Admitting: Emergency Medicine

## 2020-07-31 ENCOUNTER — Other Ambulatory Visit: Payer: Self-pay

## 2020-07-31 ENCOUNTER — Emergency Department (HOSPITAL_COMMUNITY): Payer: Medicare Other

## 2020-07-31 DIAGNOSIS — Z79899 Other long term (current) drug therapy: Secondary | ICD-10-CM | POA: Insufficient documentation

## 2020-07-31 DIAGNOSIS — Z85828 Personal history of other malignant neoplasm of skin: Secondary | ICD-10-CM | POA: Insufficient documentation

## 2020-07-31 DIAGNOSIS — Z87891 Personal history of nicotine dependence: Secondary | ICD-10-CM | POA: Insufficient documentation

## 2020-07-31 DIAGNOSIS — Z8546 Personal history of malignant neoplasm of prostate: Secondary | ICD-10-CM | POA: Diagnosis not present

## 2020-07-31 DIAGNOSIS — E039 Hypothyroidism, unspecified: Secondary | ICD-10-CM | POA: Diagnosis not present

## 2020-07-31 DIAGNOSIS — R5383 Other fatigue: Secondary | ICD-10-CM

## 2020-07-31 DIAGNOSIS — R531 Weakness: Secondary | ICD-10-CM | POA: Diagnosis not present

## 2020-07-31 DIAGNOSIS — I10 Essential (primary) hypertension: Secondary | ICD-10-CM | POA: Insufficient documentation

## 2020-07-31 DIAGNOSIS — Z7901 Long term (current) use of anticoagulants: Secondary | ICD-10-CM | POA: Insufficient documentation

## 2020-07-31 DIAGNOSIS — M545 Low back pain, unspecified: Secondary | ICD-10-CM | POA: Insufficient documentation

## 2020-07-31 DIAGNOSIS — R319 Hematuria, unspecified: Secondary | ICD-10-CM

## 2020-07-31 LAB — COMPREHENSIVE METABOLIC PANEL
ALT: 15 U/L (ref 0–44)
AST: 19 U/L (ref 15–41)
Albumin: 3.8 g/dL (ref 3.5–5.0)
Alkaline Phosphatase: 111 U/L (ref 38–126)
Anion gap: 11 (ref 5–15)
BUN: 20 mg/dL (ref 8–23)
CO2: 23 mmol/L (ref 22–32)
Calcium: 8.9 mg/dL (ref 8.9–10.3)
Chloride: 99 mmol/L (ref 98–111)
Creatinine, Ser: 1.22 mg/dL (ref 0.61–1.24)
GFR, Estimated: 58 mL/min — ABNORMAL LOW (ref >60)
Glucose, Bld: 108 mg/dL — ABNORMAL HIGH (ref 70–99)
Potassium: 3.9 mmol/L (ref 3.5–5.1)
Sodium: 133 mmol/L — ABNORMAL LOW (ref 135–145)
Total Bilirubin: 0.8 mg/dL (ref 0.3–1.2)
Total Protein: 7.8 g/dL (ref 6.5–8.1)

## 2020-07-31 LAB — TSH: TSH: 5.026 u[IU]/mL — ABNORMAL HIGH (ref 0.350–4.500)

## 2020-07-31 LAB — URINALYSIS, ROUTINE W REFLEX MICROSCOPIC
Bilirubin Urine: NEGATIVE
Glucose, UA: NEGATIVE mg/dL
Hgb urine dipstick: NEGATIVE
Ketones, ur: NEGATIVE mg/dL
Leukocytes,Ua: NEGATIVE
Nitrite: NEGATIVE
Protein, ur: 100 mg/dL — AB
Specific Gravity, Urine: 1.024 (ref 1.005–1.030)
pH: 6 (ref 5.0–8.0)

## 2020-07-31 LAB — CBC
HCT: 40 % (ref 39.0–52.0)
Hemoglobin: 12.6 g/dL — ABNORMAL LOW (ref 13.0–17.0)
MCH: 29.4 pg (ref 26.0–34.0)
MCHC: 31.5 g/dL (ref 30.0–36.0)
MCV: 93.2 fL (ref 80.0–100.0)
Platelets: 314 10*3/uL (ref 150–400)
RBC: 4.29 MIL/uL (ref 4.22–5.81)
RDW: 15.7 % — ABNORMAL HIGH (ref 11.5–15.5)
WBC: 12 10*3/uL — ABNORMAL HIGH (ref 4.0–10.5)
nRBC: 0 % (ref 0.0–0.2)

## 2020-07-31 NOTE — ED Provider Notes (Signed)
[facility] Provider Note   CSN: [id number] Arrival date & time: 07/31/20  1038     History No chief complaint on file.   Steven Gutierrez is a 87 y.o. male.  HPI Patient presents with generalized weakness.  States he has been feeling bad for the last couple weeks.  States he had an injection in his back and had some blood in the urine after that.  States he has been dealing with weakness for couple weeks now.  States he has had 2 Covid test during that time that was negative.  States he had been given 2 antibiotics through [facility] although does not know what they were and said that one of them messed with his stomach.  Not having fevers.  States he has pain in the back goes down the leg.  States he was told he may need to see a neurologist for this.  Has known history of prostate cancer.  States he has had thyroid issues in the past.  States he was low and they put him on medicine.  States he has not been consistent taking that medicine with everything has been going on here.  No chest pain.  States he had had some cough previously but that is cleared up.  No headache.  No lateralizing numbness or weakness although states that the pain is worse in his right lower leg.  History of A. fib.  Is on anticoagulation for.  Patient states after the back injection he had some blood in the urine but states that has since cleared up.    Past Medical History:  Diagnosis Date  . A-fib ([place])   . Cancer ([place])    skin cancer  . Cancer [facility])    prostate  . Glaucoma 11/17/2019  . Gout 11/17/2019  . Hypertension   . Hypothyroidism 11/17/2019    Patient Active Problem List   Diagnosis Date Noted  . 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 SIRS (systemic inflammatory response syndrome) ([place]) 11/17/2019  . Hypothyroidism 11/17/2019  . Gout 11/17/2019  . Glaucoma 11/17/2019  . Bone metastasis ([place]) 10/28/2019  . Pneumonia due to COVID-19 virus  09/01/2019  . Unspecified atrial fibrillation ([place]) 09/01/2019  . Essential hypertension 09/01/2019    Past Surgical History:  Procedure Laterality Date  . ESOPHAGOGASTRODUODENOSCOPY  03/2018  . TRANSURETHRAL RESECTION OF PROSTATE  04/21/2019       Family History  Problem Relation Age of Onset  . Heart attack Mother   . Stroke Father   . Heart attack Sister   . Lung cancer Brother     Social History   Tobacco Use  . Smoking status: Former Smoker    Packs/day: 0.50    Types: Cigarettes  . Smokeless tobacco: Never Used  Vaping Use  . Vaping Use: Never used  Substance Use Topics  . Alcohol use: Yes    Alcohol/week: 1.0 standard drink    Types: 1 Glasses of wine per week    Comment: occ  . Drug use: Never    Home Medications Prior to Admission medications   Medication Sig Start Date End Date Taking? Authorizing Provider  abiraterone acetate (ZYTIGA) 250 MG tablet Take 3 tablets (750 mg total) by mouth daily. Take on an empty stomach 1 hour before or 2 hours after a meal 05/11/20   Derek Jack, MD  acetaminophen (TYLENOL) 500 MG tablet Take 1,000 mg by mouth daily as needed for fever (for pain.).  [provider]  allopurinol (ZYLOPRIM) 300 MG tablet Take 300 mg by mouth daily.    [provider]  amLODipine (NORVASC) 5 MG tablet Take 1 tablet (5 mg total) by mouth daily. 11/18/19 03/01/20  Manuella Ghazi, Pratik D, DO  cholecalciferol (VITAMIN D3) 25 MCG (1000 UNIT) tablet Take 1,000 Units by mouth daily.    [provider]  docusate sodium (PHILLIPS STOOL SOFTENER) 100 MG capsule Take 100 mg by mouth daily as needed for mild constipation.    [provider]  gabapentin (NEURONTIN) 300 MG capsule Take 300 mg by mouth 3 (three) times daily. 06/21/20   [provider]  lactose free nutrition (BOOST) LIQD Take 237 mLs by mouth 2 (two) times daily between meals. Indefinitely. 06/15/20   Derek Jack, MD  levothyroxine  (SYNTHROID) 50 MCG tablet Take 50 mcg by mouth daily before breakfast.    [provider]  lidocaine (LIDODERM) 5 % Place 1 patch onto the skin daily. Remove & Discard patch within 12 hours or as directed by MD 10/21/19   Henderly, Britni A, PA-C  Multiple Vitamin (MULTIVITAMIN WITH MINERALS) TABS tablet Take 1 tablet by mouth daily.    [provider]  ondansetron (ZOFRAN ODT) 4 MG disintegrating tablet Take 1 tablet (4 mg total) by mouth every 8 (eight) hours as needed for nausea or vomiting. 10/21/19   Henderly, Britni A, PA-C  pantoprazole (PROTONIX) 40 MG tablet Take 40 mg by mouth daily.     [provider]  predniSONE (DELTASONE) 5 MG tablet Take 1 tablet (5 mg total) by mouth daily with breakfast. 04/29/20   Derek Jack, MD  rivaroxaban (XARELTO) 20 MG TABS tablet Take 20 mg by mouth in the morning.     [provider]  timolol (BETIMOL) 0.5 % ophthalmic solution Place 1 drop into the left eye in the morning.    [provider]  traMADol (ULTRAM) 50 MG tablet Take 1-2 tablets (50-100 mg total) by mouth every 6 (six) hours as needed. 07/05/20   Jacquelin Hawking, NP    Allergies    Oxycodone  Review of Systems   Review of Systems  Constitutional: Positive for fatigue. Negative for appetite change.  Respiratory: Negative for shortness of breath.   Cardiovascular: Negative for chest pain.  Gastrointestinal: Negative for abdominal pain.  Genitourinary: Positive for hematuria.  Musculoskeletal: Positive for back pain.  Skin: Negative for rash.  Neurological: Positive for weakness.  Psychiatric/Behavioral: Negative for confusion.    Physical Exam Updated Vital Signs BP 130/89   Pulse 85   Temp 98.3 F (36.8 C) (Oral)   Resp 17   SpO2 98%   Physical Exam Vitals and nursing note reviewed.  Constitutional:      Appearance: Normal appearance.  HENT:     Mouth/Throat:     Mouth: Mucous membranes are moist.  Eyes:     Pupils:  Pupils are equal, round, and reactive to light.  Cardiovascular:     Rate and Rhythm: Normal rate. Rhythm irregular.  Pulmonary:     Breath sounds: No wheezing or rhonchi.  Abdominal:     Tenderness: There is no abdominal tenderness.  Musculoskeletal:     Comments: Pain with straight leg raise on right side.  Sensation intact over all extremities.  Good strength in upper and left lower extremity.  Skin:    General: Skin is warm.  Neurological:     General: No focal deficit present.     Mental Status:  He is alert and oriented to person, place, and time.     ED Results / Procedures / Treatments   Labs (all labs ordered are listed, but only abnormal results are displayed) Labs Reviewed  URINALYSIS, ROUTINE W REFLEX MICROSCOPIC - Abnormal; Notable for the following components:      Result Value   Color, Urine AMBER (*)    APPearance HAZY (*)    Protein, ur 100 (*)    Bacteria, UA FEW (*)    All other components within normal limits  CBC - Abnormal; Notable for the following components:   WBC 12.0 (*)    Hemoglobin 12.6 (*)    RDW 15.7 (*)    All other components within normal limits  COMPREHENSIVE METABOLIC PANEL - Abnormal; Notable for the following components:   Sodium 133 (*)    Glucose, Bld 108 (*)    GFR, Estimated 58 (*)    All other components within normal limits  TSH - Abnormal; Notable for the following components:   TSH 5.026 (*)    All other components within normal limits  URINE CULTURE    EKG EKG Interpretation  Date/Time:  Sunday July 31 2020 16:26:55 EST Ventricular Rate:  73 PR Interval:    QRS Duration: 99 QT Interval:  420 QTC Calculation: 463 R Axis:   18 Text Interpretation: Atrial fibrillation chronic afib Confirmed by Davonna Belling [phone]) on 07/31/2020 6:05:10 PM   Radiology DG Chest Portable 1 View  Result Date: 07/31/2020 CLINICAL DATA:  Weakness EXAM: PORTABLE CHEST 1 VIEW COMPARISON:  11/17/2019, 09/04/2019, 09/01/2019, PET CT  11/10/2019 FINDINGS: Coarse mildly reticular diffuse pulmonary opacity likely due to chronic disease. No acute consolidation or effusion. Stable cardiomediastinal silhouette with borderline cardiomegaly and aortic atherosclerosis. 14 mm rounded opacity in the right upper lung and 14 mm nodular opacity in the left mid lung. IMPRESSION: 1. No acute pulmonary infiltrate or edema. 2. Probable underlying chronic interstitial lung disease. Possible bilateral lung nodules. Chest CT is recommended for further evaluation. Electronically Signed   By: Donavan Foil M.D.   On: 07/31/2020 16:39    Procedures Procedures (including critical care time)  Medications Ordered in ED Medications - No data to display  ED Course  I have reviewed the triage vital signs and the nursing notes.  Pertinent labs & imaging results that were available during my care of the patient were reviewed by me and considered in my medical decision making (see chart for details).    MDM Rules/Calculators/A&P                          Patient with fatigue.  Has been going for a while now.  Has been dealing with some other issues like blood in the urine.  TSH mildly elevated.  Has not been taking his medicines.  Some blood in the urine but not clear infection.  Chest x-ray shows some nonspecific nodules.  Has known metastatic cancer.  Reviewing records MRI had shown hydronephrosis.  Patient did not follow with urology for the hydronephrosis and hematuria.  No lymphadenopathy in abdomen.  Patient's oncologist can follow with the lung nodules.  Fatigue potentially could be secondary to hypothyroidism particular in the setting of noncompliance.  Patient will take his medicines.  Will discharge home. Final Clinical Impression(s) / ED Diagnoses Final diagnoses:  Fatigue, unspecified type  Hypothyroidism, unspecified type  Hematuria, unspecified type    Rx / DC Orders ED Discharge Orders  None       Benjiman Core, MD 08/01/20  [phone]

## 2020-07-31 NOTE — ED Triage Notes (Addendum)
Pt c/o weakness for the past week with blood in his urine.  Pt denies abdominal pain. Pt states he was tested for covid 2 days ago at the [facility] rescue squad and was negative.

## 2020-07-31 NOTE — Discharge Instructions (Addendum)
Your chest x-rays had some nodules that need following and your previous MRI showed hydronephrosis.  With the fact you have been passing some blood you should follow-up with urology.  Dr. Ellin Saba can follow-up with the nodules.  The thyroid level also looks as if it is undertreated.  Be consistent in taking her medications.  Urine culture has been sent and you will be notified if it comes back positive.

## 2020-08-03 LAB — URINE CULTURE: Culture: 10000 — AB

## 2020-08-08 MED FILL — ABIRATERONE ACETATE 250 MG: 250 | 30 days supply | Qty: 90 | Fill #2

## 2020-08-13 ENCOUNTER — Inpatient Hospital Stay (HOSPITAL_COMMUNITY)
Admission: EM | Admit: 2020-08-13 | Discharge: 2020-08-16 | DRG: 669 | Disposition: A | Discharge status: Home Health | Payer: Medicare Other | Attending: Internal Medicine | Admitting: Internal Medicine

## 2020-08-13 ENCOUNTER — Emergency Department (HOSPITAL_COMMUNITY): Payer: Medicare Other

## 2020-08-13 ENCOUNTER — Other Ambulatory Visit: Payer: Self-pay

## 2020-08-13 ENCOUNTER — Encounter (HOSPITAL_COMMUNITY): Payer: Self-pay | Admitting: *Deleted

## 2020-08-13 DIAGNOSIS — R310 Gross hematuria: Secondary | ICD-10-CM | POA: Diagnosis present

## 2020-08-13 DIAGNOSIS — C7951 Secondary malignant neoplasm of bone: Secondary | ICD-10-CM | POA: Diagnosis present

## 2020-08-13 DIAGNOSIS — Z823 Family history of stroke: Secondary | ICD-10-CM

## 2020-08-13 DIAGNOSIS — Z801 Family history of malignant neoplasm of trachea, bronchus and lung: Secondary | ICD-10-CM

## 2020-08-13 DIAGNOSIS — R42 Dizziness and giddiness: Secondary | ICD-10-CM | POA: Diagnosis present

## 2020-08-13 DIAGNOSIS — Z7901 Long term (current) use of anticoagulants: Secondary | ICD-10-CM

## 2020-08-13 DIAGNOSIS — C7802 Secondary malignant neoplasm of left lung: Secondary | ICD-10-CM | POA: Diagnosis present

## 2020-08-13 DIAGNOSIS — I951 Orthostatic hypotension: Secondary | ICD-10-CM

## 2020-08-13 DIAGNOSIS — E8809 Other disorders of plasma-protein metabolism, not elsewhere classified: Secondary | ICD-10-CM

## 2020-08-13 DIAGNOSIS — D62 Acute posthemorrhagic anemia: Secondary | ICD-10-CM | POA: Diagnosis present

## 2020-08-13 DIAGNOSIS — Z7952 Long term (current) use of systemic steroids: Secondary | ICD-10-CM

## 2020-08-13 DIAGNOSIS — D72829 Elevated white blood cell count, unspecified: Secondary | ICD-10-CM | POA: Diagnosis present

## 2020-08-13 DIAGNOSIS — Y92009 Unspecified place in unspecified non-institutional (private) residence as the place of occurrence of the external cause: Secondary | ICD-10-CM | POA: Diagnosis not present

## 2020-08-13 DIAGNOSIS — I482 Chronic atrial fibrillation, unspecified: Secondary | ICD-10-CM | POA: Diagnosis not present

## 2020-08-13 DIAGNOSIS — Z20822 Contact with and (suspected) exposure to covid-19: Secondary | ICD-10-CM | POA: Diagnosis present

## 2020-08-13 DIAGNOSIS — Z191 Hormone sensitive malignancy status: Secondary | ICD-10-CM | POA: Diagnosis not present

## 2020-08-13 DIAGNOSIS — J9811 Atelectasis: Secondary | ICD-10-CM | POA: Diagnosis present

## 2020-08-13 DIAGNOSIS — N179 Acute kidney failure, unspecified: Secondary | ICD-10-CM | POA: Diagnosis present

## 2020-08-13 DIAGNOSIS — N131 Hydronephrosis with ureteral stricture, not elsewhere classified: Secondary | ICD-10-CM | POA: Diagnosis present

## 2020-08-13 DIAGNOSIS — I10 Essential (primary) hypertension: Secondary | ICD-10-CM | POA: Diagnosis present

## 2020-08-13 DIAGNOSIS — C779 Secondary and unspecified malignant neoplasm of lymph node, unspecified: Secondary | ICD-10-CM | POA: Diagnosis present

## 2020-08-13 DIAGNOSIS — Z8546 Personal history of malignant neoplasm of prostate: Secondary | ICD-10-CM | POA: Diagnosis not present

## 2020-08-13 DIAGNOSIS — C7911 Secondary malignant neoplasm of bladder: Secondary | ICD-10-CM | POA: Diagnosis not present

## 2020-08-13 DIAGNOSIS — R296 Repeated falls: Secondary | ICD-10-CM | POA: Diagnosis present

## 2020-08-13 DIAGNOSIS — E039 Hypothyroidism, unspecified: Secondary | ICD-10-CM | POA: Diagnosis present

## 2020-08-13 DIAGNOSIS — K922 Gastrointestinal hemorrhage, unspecified: Secondary | ICD-10-CM | POA: Diagnosis present

## 2020-08-13 DIAGNOSIS — H409 Unspecified glaucoma: Secondary | ICD-10-CM | POA: Diagnosis present

## 2020-08-13 DIAGNOSIS — Z79890 Hormone replacement therapy (postmenopausal): Secondary | ICD-10-CM

## 2020-08-13 DIAGNOSIS — R9431 Abnormal electrocardiogram [ECG] [EKG]: Secondary | ICD-10-CM

## 2020-08-13 DIAGNOSIS — E871 Hypo-osmolality and hyponatremia: Secondary | ICD-10-CM | POA: Diagnosis present

## 2020-08-13 DIAGNOSIS — R531 Weakness: Secondary | ICD-10-CM

## 2020-08-13 DIAGNOSIS — D649 Anemia, unspecified: Secondary | ICD-10-CM | POA: Diagnosis not present

## 2020-08-13 DIAGNOSIS — C7801 Secondary malignant neoplasm of right lung: Secondary | ICD-10-CM | POA: Diagnosis present

## 2020-08-13 DIAGNOSIS — M79604 Pain in right leg: Secondary | ICD-10-CM | POA: Diagnosis present

## 2020-08-13 DIAGNOSIS — I4891 Unspecified atrial fibrillation: Secondary | ICD-10-CM | POA: Diagnosis present

## 2020-08-13 DIAGNOSIS — N329 Bladder disorder, unspecified: Secondary | ICD-10-CM | POA: Diagnosis present

## 2020-08-13 DIAGNOSIS — Z85828 Personal history of other malignant neoplasm of skin: Secondary | ICD-10-CM

## 2020-08-13 DIAGNOSIS — R319 Hematuria, unspecified: Secondary | ICD-10-CM | POA: Diagnosis present

## 2020-08-13 DIAGNOSIS — Z9079 Acquired absence of other genital organ(s): Secondary | ICD-10-CM

## 2020-08-13 DIAGNOSIS — C799 Secondary malignant neoplasm of unspecified site: Secondary | ICD-10-CM

## 2020-08-13 DIAGNOSIS — Z8249 Family history of ischemic heart disease and other diseases of the circulatory system: Secondary | ICD-10-CM

## 2020-08-13 DIAGNOSIS — W1830XA Fall on same level, unspecified, initial encounter: Secondary | ICD-10-CM | POA: Diagnosis present

## 2020-08-13 DIAGNOSIS — W19XXXA Unspecified fall, initial encounter: Secondary | ICD-10-CM | POA: Diagnosis not present

## 2020-08-13 DIAGNOSIS — M109 Gout, unspecified: Secondary | ICD-10-CM | POA: Diagnosis present

## 2020-08-13 DIAGNOSIS — Z87891 Personal history of nicotine dependence: Secondary | ICD-10-CM

## 2020-08-13 DIAGNOSIS — Z79899 Other long term (current) drug therapy: Secondary | ICD-10-CM

## 2020-08-13 DIAGNOSIS — C61 Malignant neoplasm of prostate: Secondary | ICD-10-CM

## 2020-08-13 DIAGNOSIS — Z7189 Other specified counseling: Secondary | ICD-10-CM | POA: Diagnosis not present

## 2020-08-13 LAB — CBC WITH DIFFERENTIAL/PLATELET
Abs Immature Granulocytes: 0.15 10*3/uL — ABNORMAL HIGH (ref 0.00–0.07)
Basophils Absolute: 0 10*3/uL (ref 0.0–0.1)
Basophils Relative: 0 %
Eosinophils Absolute: 0.2 10*3/uL (ref 0.0–0.5)
Eosinophils Relative: 1 %
HCT: 27.7 % — ABNORMAL LOW (ref 39.0–52.0)
Hemoglobin: 8.6 g/dL — ABNORMAL LOW (ref 13.0–17.0)
Immature Granulocytes: 1 %
Lymphocytes Relative: 14 %
Lymphs Abs: 1.4 10*3/uL (ref 0.7–4.0)
MCH: 28.9 pg (ref 26.0–34.0)
MCHC: 31 g/dL (ref 30.0–36.0)
MCV: 93 fL (ref 80.0–100.0)
Monocytes Absolute: 0.9 10*3/uL (ref 0.1–1.0)
Monocytes Relative: 9 %
Neutro Abs: 7.8 10*3/uL — ABNORMAL HIGH (ref 1.7–7.7)
Neutrophils Relative %: 75 %
Platelets: 366 10*3/uL (ref 150–400)
RBC: 2.98 MIL/uL — ABNORMAL LOW (ref 4.22–5.81)
RDW: 15.3 % (ref 11.5–15.5)
WBC: 10.5 10*3/uL (ref 4.0–10.5)
nRBC: 0 % (ref 0.0–0.2)

## 2020-08-13 LAB — VITAMIN B12: Vitamin B-12: 535 pg/mL (ref 180–914)

## 2020-08-13 LAB — COMPREHENSIVE METABOLIC PANEL
ALT: 12 U/L (ref 0–44)
AST: 19 U/L (ref 15–41)
Albumin: 2.8 g/dL — ABNORMAL LOW (ref 3.5–5.0)
Alkaline Phosphatase: 91 U/L (ref 38–126)
Anion gap: 9 (ref 5–15)
BUN: 19 mg/dL (ref 8–23)
CO2: 22 mmol/L (ref 22–32)
Calcium: 7.8 mg/dL — ABNORMAL LOW (ref 8.9–10.3)
Chloride: 101 mmol/L (ref 98–111)
Creatinine, Ser: 1.31 mg/dL — ABNORMAL HIGH (ref 0.61–1.24)
GFR, Estimated: 53 mL/min — ABNORMAL LOW (ref >60)
Glucose, Bld: 123 mg/dL — ABNORMAL HIGH (ref 70–99)
Potassium: 3.8 mmol/L (ref 3.5–5.1)
Sodium: 132 mmol/L — ABNORMAL LOW (ref 135–145)
Total Bilirubin: 0.9 mg/dL (ref 0.3–1.2)
Total Protein: 6.1 g/dL — ABNORMAL LOW (ref 6.5–8.1)

## 2020-08-13 LAB — IRON AND TIBC
Iron: 16 ug/dL — ABNORMAL LOW (ref 45–182)
Saturation Ratios: 8 % — ABNORMAL LOW (ref 17.9–39.5)
TIBC: 211 ug/dL — ABNORMAL LOW (ref 250–450)
UIBC: 195 ug/dL

## 2020-08-13 LAB — URINALYSIS, ROUTINE W REFLEX MICROSCOPIC
Bacteria, UA: NONE SEEN
RBC / HPF: >50 RBC/hpf — ABNORMAL HIGH (ref 0–5)

## 2020-08-13 LAB — RETICULOCYTES
Immature Retic Fract: 37.9 % — ABNORMAL HIGH (ref 2.3–15.9)
RBC.: 2.78 MIL/uL — ABNORMAL LOW (ref 4.22–5.81)
Retic Count, Absolute: 87.6 10*3/uL (ref 19.0–186.0)
Retic Ct Pct: 3.2 % — ABNORMAL HIGH (ref 0.4–3.1)

## 2020-08-13 LAB — POC OCCULT BLOOD, ED: Fecal Occult Bld: POSITIVE — AB

## 2020-08-13 LAB — RESP PANEL BY RT-PCR (FLU A&B, COVID) ARPGX2
Influenza A by PCR: NEGATIVE
Influenza B by PCR: NEGATIVE
SARS Coronavirus 2 by RT PCR: NEGATIVE

## 2020-08-13 LAB — FERRITIN: Ferritin: 186 ng/mL (ref 24–336)

## 2020-08-13 LAB — FOLATE: Folate: 19.1 ng/mL (ref >5.9)

## 2020-08-13 MED ORDER — PROCHLORPERAZINE MALEATE 10 MG PO TABS
10.0000 mg | ORAL_TABLET | Freq: Four times a day (QID) | ORAL | Status: DC | PRN
  Administered 2020-08-15: 10 mg via ORAL
  Filled 2020-08-13: qty 1

## 2020-08-13 MED ORDER — ENSURE ENLIVE PO LIQD
237.0000 mL | Freq: Two times a day (BID) | ORAL | Status: DC
  Administered 2020-08-15 – 2020-08-16 (×2): 237 mL via ORAL

## 2020-08-13 MED ORDER — MORPHINE SULFATE (PF) 2 MG/ML IV SOLN
2.0000 mg | INTRAVENOUS | Status: DC | PRN
  Administered 2020-08-13 – 2020-08-15 (×2): 2 mg via INTRAVENOUS
  Filled 2020-08-13 (×2): qty 1

## 2020-08-13 MED ORDER — SODIUM CHLORIDE 0.9 % IV BOLUS
1000.0000 mL | Freq: Once | INTRAVENOUS | Status: AC
  Administered 2020-08-13: 1000 mL via INTRAVENOUS

## 2020-08-13 MED ORDER — FENTANYL CITRATE (PF) 100 MCG/2ML IJ SOLN
100.0000 ug | INTRAMUSCULAR | Status: AC | PRN
  Administered 2020-08-13 (×4): 100 ug via INTRAVENOUS
  Filled 2020-08-13 (×4): qty 2

## 2020-08-13 MED ORDER — ONDANSETRON HCL 4 MG/2ML IJ SOLN
4.0000 mg | Freq: Once | INTRAMUSCULAR | Status: AC
  Administered 2020-08-13: 4 mg via INTRAVENOUS
  Filled 2020-08-13: qty 2

## 2020-08-13 MED ORDER — PANTOPRAZOLE SODIUM 40 MG IV SOLR
40.0000 mg | Freq: Two times a day (BID) | INTRAVENOUS | Status: DC
  Administered 2020-08-14 – 2020-08-16 (×5): 40 mg via INTRAVENOUS
  Filled 2020-08-13 (×5): qty 40

## 2020-08-13 NOTE — ED Provider Notes (Signed)
Blood pressure 110/73, pulse 88, temperature 98.1 F (36.7 C), temperature source Oral, resp. rate 18, height 6' (1.829 m), weight 81.6 kg, SpO2 94 %.  Assuming care from Dr. Eulis Gutierrez.  In short, Steven Gutierrez is a 87 y.o. male with a chief complaint of Loss of Consciousness and Hematuria .  Refer to the original H&P for additional details.  The current plan of care is to f/u on labs and likely admit. Hypotension at home in the setting of metastatic prostate cancer. Followed by Oncology here at [facility]. Multiple falls at home. Positive orthostatics here.   03:20 PM  Went to re-evaluate patient after assuming care. Patient's imaging of the leg completed with no acute findings. Known mets to spine and abdomen. No neuro deficits or AMS to strongly suspect brain mets. Hb down sharply from levels earlier this month but not to level requiring transfusion. Patient has been passing a lot of blood with urination including clots but no retention symptoms. Questions the possibility of blood in stool as well. He is anticoagulated with a-fib history (xarelto). Will add on hemoccult which was performed. Stool brown. No gross blood or melena. Will add CT renal and CT head. UA pending. Question if generalized weakness and passing out are related to acute blood loss anemia with source being hematuria +/- GI losses.   Spoke with Dr. Lovena Gutierrez with urology regarding the CT findings.  Advises that we place a larger catheter and hand irrigate to remove some of the clots in the bladder.  Continue to follow the patient's hemoglobin to decide if he needs blood transfusion.  Patient remains hemodynamically stable here.  Dr. Lovena Gutierrez does not see an indication for admission to [name] Gutier[facility] for urology intervention.   If bladder not irrigating can try larger foley up to 24 F with hand irrigation or 3-way cath with continuous irrigation.   Discussed patient's case with [facility] to request admission. Patient and family (if present) updated with  plan. Care transferred to [facility] service.  I reviewed all nursing notes, vitals, pertinent old records, EKGs, labs, imaging (as available).     Steven Fast, MD 08/13/20 2109

## 2020-08-13 NOTE — ED Provider Notes (Signed)
[facility] EMERGENCY DEPARTMENT Provider Note   CSN: [id number] Arrival date & time: 08/13/20  1202     History Chief Complaint  Patient presents with  . Loss of Consciousness  . Hematuria    Steven Gutierrez is a 87 y.o. male.  HPI Patient presents with ongoing pain and pelvic region associated with metastatic cancer.  Also is having worsening bleeding from his urethra.  He is being followed for and treated by oncology; for metastatic prostate cancer.  He denies fever, chills, vomiting.  He lives alone and came here for evaluation, by private vehicle.  He has had increasing difficulty caring for himself because of weakness.  Yesterday he fell and injured his head.  He was using a walker at the time.  He is also had weakness causing him to go to floor without syncope.  This morning his blood pressure was checked by family member and found to be low when going from supine to sitting position.  Supine pressure was 141/68, lowered to 66/43 with standing, pulse went from 91-85 with this maneuver.  He is being treated with tramadol for bony pain from metastatic cancer.  He does not take this chronically.  There are no other known modifying factors.    Past Medical History:  Diagnosis Date  . A-fib ([place])   . Cancer ([place])    skin cancer  . Cancer [facility])    prostate  . Glaucoma 11/17/2019  . Gout 11/17/2019  . Hypertension   . Hypothyroidism 11/17/2019    Patient Active Problem List   Diagnosis Date Noted  . 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 SIRS (systemic inflammatory response syndrome) ([place]) 11/17/2019  . Hypothyroidism 11/17/2019  . Gout 11/17/2019  . Glaucoma 11/17/2019  . Bone metastasis ([place]) 10/28/2019  . Pneumonia due to COVID-19 virus 09/01/2019  . Unspecified atrial fibrillation ([place]) 09/01/2019  . Essential hypertension 09/01/2019    Past Surgical History:  Procedure Laterality Date  . ESOPHAGOGASTRODUODENOSCOPY  03/2018  .  TRANSURETHRAL RESECTION OF PROSTATE  04/21/2019       Family History  Problem Relation Age of Onset  . Heart attack Mother   . Stroke Father   . Heart attack Sister   . Lung cancer Brother     Social History   Tobacco Use  . Smoking status: Former Smoker    Packs/day: 0.50    Types: Cigarettes  . Smokeless tobacco: Never Used  Vaping Use  . Vaping Use: Never used  Substance Use Topics  . Alcohol use: Yes    Alcohol/week: 1.0 standard drink    Types: 1 Glasses of wine per week    Comment: occ  . Drug use: Never    Home Medications Prior to Admission medications   Medication Sig Start Date End Date Taking? Authorizing Provider  abiraterone acetate (ZYTIGA) 250 MG tablet Take 3 tablets (750 mg total) by mouth daily. Take on an empty stomach 1 hour before or 2 hours after a meal 05/11/20   Derek Jack, MD  acetaminophen (TYLENOL) 500 MG tablet Take 1,000 mg by mouth daily as needed for fever (for pain.).     [provider]  allopurinol (ZYLOPRIM) 300 MG tablet Take 300 mg by mouth daily.    [provider]  amLODipine (NORVASC) 5 MG tablet Take 1 tablet (5 mg total) by mouth daily. 11/18/19 03/01/20  Manuella Ghazi, Pratik D, DO  cholecalciferol (VITAMIN D3) 25 MCG (1000 UNIT) tablet Take 1,000 Units by mouth daily.  [provider]  docusate sodium (PHILLIPS STOOL SOFTENER) 100 MG capsule Take 100 mg by mouth daily as needed for mild constipation.    [provider]  gabapentin (NEURONTIN) 300 MG capsule Take 300 mg by mouth 3 (three) times daily. 06/21/20   [provider]  lactose free nutrition (BOOST) LIQD Take 237 mLs by mouth 2 (two) times daily between meals. Indefinitely. 06/15/20   Derek Jack, MD  levothyroxine (SYNTHROID) 50 MCG tablet Take 50 mcg by mouth daily before breakfast.    [provider]  lidocaine (LIDODERM) 5 % Place 1 patch onto the skin daily. Remove & Discard patch within 12 hours or as  directed by MD 10/21/19   Henderly, Britni A, PA-C  Multiple Vitamin (MULTIVITAMIN WITH MINERALS) TABS tablet Take 1 tablet by mouth daily.    [provider]  ondansetron (ZOFRAN ODT) 4 MG disintegrating tablet Take 1 tablet (4 mg total) by mouth every 8 (eight) hours as needed for nausea or vomiting. 10/21/19   Henderly, Britni A, PA-C  pantoprazole (PROTONIX) 40 MG tablet Take 40 mg by mouth daily.     [provider]  predniSONE (DELTASONE) 5 MG tablet Take 1 tablet (5 mg total) by mouth daily with breakfast. 04/29/20   Derek Jack, MD  rivaroxaban (XARELTO) 20 MG TABS tablet Take 20 mg by mouth in the morning.     [provider]  timolol (BETIMOL) 0.5 % ophthalmic solution Place 1 drop into the left eye in the morning.    [provider]  traMADol (ULTRAM) 50 MG tablet Take 1-2 tablets (50-100 mg total) by mouth every 6 (six) hours as needed. 07/05/20   Jacquelin Hawking, NP    Allergies    Oxycodone  Review of Systems   Review of Systems  All other systems reviewed and are negative.   Physical Exam Updated Vital Signs BP 110/73   Pulse 88   Temp 98.1 F (36.7 C) (Oral)   Resp 18   Ht 6' (1.829 m)   Wt 81.6 kg   SpO2 94%   BMI 24.41 kg/m   Physical Exam Vitals and nursing note reviewed.  Constitutional:      General: He is in acute distress (Uncomfortable).     Appearance: He is well-developed and well-nourished. He is not ill-appearing, toxic-appearing or diaphoretic.  HENT:     Head: Normocephalic and atraumatic.     Right Ear: External ear normal.     Left Ear: External ear normal.  Eyes:     Extraocular Movements: EOM normal.     Conjunctiva/sclera: Conjunctivae normal.     Pupils: Pupils are equal, round, and reactive to light.  Neck:     Trachea: Phonation normal.  Cardiovascular:     Rate and Rhythm: Normal rate and regular rhythm.     Heart sounds: Normal heart sounds.  Pulmonary:     Effort: Pulmonary effort is  normal.     Breath sounds: Normal breath sounds.  Chest:     Chest wall: No bony tenderness.  Abdominal:     General: There is distension.     Palpations: Abdomen is soft.  Musculoskeletal:     Cervical back: Normal range of motion and neck supple.     Comments: He guards against movement of the right upper leg secondary to right hip and right proximal femur pain  Skin:    General: Skin is warm, dry and intact.  Neurological:     Mental  Status: He is alert and oriented to person, place, and time.     Cranial Nerves: No cranial nerve deficit.     Sensory: No sensory deficit.     Motor: No abnormal muscle tone.     Coordination: Coordination normal.  Psychiatric:        Mood and Affect: Mood and affect and mood normal.        Behavior: Behavior normal.        Thought Content: Thought content normal.        Judgment: Judgment normal.     ED Results / Procedures / Treatments   Labs (all labs ordered are listed, but only abnormal results are displayed) Labs Reviewed  CBC WITH DIFFERENTIAL/PLATELET - Abnormal; Notable for the following components:      Result Value   RBC 2.98 (*)    Hemoglobin 8.6 (*)    HCT 27.7 (*)    Neutro Abs 7.8 (*)    Abs Immature Granulocytes 0.15 (*)    All other components within normal limits  SARS CORONAVIRUS 2 (TAT 6-24 HRS)  URINE CULTURE  RESP PANEL BY RT-PCR (FLU A&B, COVID) ARPGX2  COMPREHENSIVE METABOLIC PANEL  URINALYSIS, ROUTINE W REFLEX MICROSCOPIC    EKG EKG Interpretation  Date/Time:  Saturday August 13 2020 12:12:42 EST Ventricular Rate:  85 PR Interval:    QRS Duration: 104 QT Interval:  449 QTC Calculation: 534 R Axis:   43 Text Interpretation: Atrial fibrillation RSR' in V1 or V2, right VCD or RVH Nonspecific repol abnormality, lateral leads Prolonged QT interval Since last tracing QT has lengthened , pvc new, Non-specific ST-t changes inferiorly Confirmed by Daleen Bo [phone]) on 08/13/2020 12:22:31  PM   Radiology DG Pelvis 1-2 Views  Result Date: 08/13/2020 CLINICAL DATA:  Status post fall x2 yesterday.  Syncopal episodes. EXAM: PELVIS - 1-2 VIEW COMPARISON:  None. FINDINGS: No acute bony or joint abnormality is identified. Evaluation of the right hip is limited as the patient is externally rotated. IMPRESSION: No acute finding. Evaluation of the right hip is limited by external rotation. Electronically Signed   By: Inge Rise M.D.   On: 08/13/2020 14:19   DG Femur Min 2 Views Right  Result Date: 08/13/2020 CLINICAL DATA:  Syncopal episode yesterday with fall. Right leg pain. EXAM: RIGHT FEMUR 2 VIEWS COMPARISON:  None. FINDINGS: There is no evidence of fracture or other focal bone lesions. Minimal degenerative change of the right hip. Soft tissues are unremarkable. IMPRESSION: No acute findings. Electronically Signed   By: Marin Olp M.D.   On: 08/13/2020 14:19    Procedures Procedures (including critical care time)  Medications Ordered in ED Medications  fentaNYL (SUBLIMAZE) injection 100 mcg (100 mcg Intravenous Given 08/13/20 1339)  ondansetron (ZOFRAN) injection 4 mg (4 mg Intravenous Given 08/13/20 1338)    ED Course  I have reviewed the triage vital signs and the nursing notes.  Pertinent labs & imaging results that were available during my care of the patient were reviewed by me and considered in my medical decision making (see chart for details).    MDM Rules/Calculators/A&P                           Patient Vitals for the past 24 hrs:  BP Temp Temp src Pulse Resp SpO2 Height Weight  08/13/20 1430 110/73 -- -- 88 18 94 % -- --  08/13/20 1402 -- 98.1 F (36.7 C) Oral -- -- -- -- --  08/13/20 1400 123/73 -- -- 84 (!) 24 99 % -- --  08/13/20 1330 126/60 -- -- 80 (!) 27 98 % -- --  08/13/20 1300 105/67 -- -- 84 (!) 29 100 % -- --  08/13/20 1231 -- -- -- 92 (!) 23 100 % -- --  08/13/20 1230 119/74 -- -- -- -- -- 6' (1.829 m) 81.6 kg  08/13/20 1217 -- -- --  -- -- 91 % -- --      Medical Decision Making:  This patient is presenting for evaluation of pain in right leg, which does require a range of treatment options, and is a complaint that involves a high risk of morbidity and mortality. The differential diagnoses include fracture, radiculopathy, complications from prostate cancer. I decided to review old records, and in summary elderly male presenting for evaluation of painful condition.  He also has ongoing hematuria, felt to be related to prostate cancer.  I did not require additional historical information from anyone.  Clinical Laboratory Tests Ordered, included CBC, Metabolic panel and Screening COVID test. Review indicates CBC with low hemoglobin, remainder of labs pending. Radiologic Tests Ordered, included x-ray pelvis and right femur, coronary fracture versus bony metastatic lesion.  I independently Visualized: Radiographic images, which show no fracture or visible metastases    Critical Interventions-clinical evaluation, pain medication, radiologic imaging, laboratory testing, observation reassessment  After These Interventions, the Patient was reevaluated and was found with persistent pain requiring parenteral narcotic, likely secondary to bony metastases.  Patient may need advanced imaging of the spine, which was last done, 2 months ago.  CRITICAL CARE-no Performed by: Daleen Bo  Nursing Notes Reviewed/ Care Coordinated Applicable Imaging Reviewed Interpretation of Laboratory Data incorporated into ED treatment  Plan disposition by Dr. Laverta Baltimore, following completion of treatment in the ED and consider hospitalization if pain cannot be controlled.    Final Clinical Impression(s) / ED Diagnoses Final diagnoses:  Metastatic malignant neoplasm, unspecified site [facility])  Right leg pain  Anemia, unspecified type  Hematuria, unspecified type    Rx / DC Orders ED Discharge Orders    None       Daleen Bo, MD 08/13/20  1515

## 2020-08-13 NOTE — ED Notes (Signed)
Attempted to irrigate foley with 60 cc of sterile water- able to push in easily, no return of fluids when attempted to pull back with 60 cc syringe. Foley has 200 cc of urine at this time. Dr Laverta Baltimore made aware- instructed to monitor for return output over the next hour.

## 2020-08-13 NOTE — ED Triage Notes (Signed)
Pt brought in from home by [place] with c/o syncopal episode yesterday, falls x 2 yesterday, hypotension, and urinary blood clots. Son reports pt was found yesterday morning in the floor, estimates a couple hours, and then later in the afternoon pt was walking to the bathroom and became dizzy and was assisted down to the floor. Pt then had seizure like activity with right sided facial droop and right sided gaze deviation that lasted about 20 seconds. 911 was called but all local hospitals were on diversion and pt didn't want to go far off so he decided to not go to the hospital. This morning pt's BP was checked by his son, lying 141/68 but with sitting him up his BP dropped to 66/43. Son also reports pt has prostate cancer and has been having lots of blood clots from his penis. Son reports it is usually "just a trickle" but lately it has become very severe.

## 2020-08-13 NOTE — H&P (Signed)
History and Physical  Steven Gutierrez JTT:[id number] DOB: 1933/05/04 DOA: 08/13/2020  Referring physician: Margette Fast, MD PCP: [name], MD  Patient coming from: Home  Chief Complaint: Hematuria, generalized weakness  HPI: Steven Gutierrez is a 87 y.o. 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 Delton Coombes), atrial fibrillation, normocytic anemia who presents to the emergency department via EMS [facility] life-saving crew) due to mechanical falls x2 at home yesterday.  Patient lives alone and complained of about 4 weeks of blood in urine with blood clots which is associated with increasing weakness.  He sustained a fall at home twice yesterday, patient was seen by son this morning and activated EMS due to patient's worsening weakness with falls, on arrival of EMS team, he was noted to have increased dizziness on standing, BP was checked in supine position (141/68) and this was repeated on standing (66/43).  He was then taken to the ED for further evaluation.  Patient denies any seizure-like activity and denies loss of consciousness when he fell, he also denies chest pain, shortness of breath, nausea, vomiting or diarrhea.  ED Course: In the emergency department, he was hemodynamically stable.  Work-up in the ED showed normocytic anemia, hyponatremia, BUN/creatinine 19/1.31 (baseline creatinine to 0.9-1.2), FOBT positive.  Urinalysis was not done due to hematuria. CT abdomen pelvis without contrast showed evidence of patient's known metastatic prostate cancer with interval progression of patient's known large irregular mass over the right pelvic sidewall measuring approximately 7.2 x 9.2 cm causing destruction of the right posterior acetabulum. This mass abuts and appears to invade the adjacent bladder at the right UVJ with moderate hyperdense material filling half the bladder likely hemorrhagic debris.  This mass also causes a degree of obstruction with mild right-sided hydroureteronephrosis. The mass abuts the right anterolateral aspect of the rectum. Numerous new pulmonary nodules within the lung bases right worse than left compatible with metastatic disease. Small left pleural effusion with minimal associated atelectasis. Interval progression of known osseous metastatic disease. CT abdomen and pelvis showed no acute intracranial abnormalities. X-ray of the pelvis and right femur showed no acute findings. IV hydration with 1 L of NS, IV fentanyl and IV Zofran x4 were given. Urologist (Dr. Lovena Neighbours) was consulted by ED physician who recommended placing large catheter and irrigation to remove some of the blood clots in the bladder and that there was no indication for admission to [facility] for any urological intervention at this time. Hospitalist was asked to admit patient for further evaluation and management.  Review of Systems: Constitutional: Negative for chills and fever.  HENT: Negative for ear pain and sore throat.   Eyes: Negative for pain and visual disturbance.  Respiratory: Negative for cough, chest tightness and shortness of breath.   Cardiovascular: Negative for chest pain and palpitations.  Gastrointestinal: Negative for abdominal pain and vomiting.  Endocrine: Negative for polyphagia and polyuria.  Genitourinary: Positive for hematuria.  Negative for decreased urine volume Musculoskeletal: Positive for right leg pain (chronic) and falls (x2-yesterday) Skin: Negative for color change and rash.  Allergic/Immunologic: Negative for immunocompromised state.  Neurological: Positive for generalized weakness.  Negative for tremors, syncope, speech difficulty hematological: Does not bruise/bleed easily.  All other systems reviewed and are negative  Past Medical History:  Diagnosis Date  . A-fib ([place])   . Cancer ([place])    skin cancer  . Cancer [facility])    prostate  . Glaucoma  11/17/2019  . Gout 11/17/2019  . Hypertension   . Hypothyroidism 11/17/2019   Past Surgical History:  Procedure Laterality Date  . ESOPHAGOGASTRODUODENOSCOPY  03/2018  . TRANSURETHRAL RESECTION OF PROSTATE  04/21/2019    Social History:  reports that he has quit smoking. His smoking use included cigarettes. He smoked 0.50 packs per day. He has never used smokeless tobacco. He reports current alcohol use of about 1.0 standard drink of alcohol per week. He reports that he does not use drugs.   Allergies  Allergen Reactions  . Oxycodone Nausea And Vomiting    Family History  Problem Relation Age of Onset  . Heart attack Mother   . Stroke Father   . Heart attack Sister   . Lung cancer Brother      Prior to Admission medications   Medication Sig Start Date End Date Taking? Authorizing Provider  abiraterone acetate (ZYTIGA) 250 MG tablet Take 3 tablets (750 mg total) by mouth daily. Take on an empty stomach 1 hour before or 2 hours after a meal 05/11/20  Yes Derek Jack, MD  acetaminophen (TYLENOL) 500 MG tablet Take 1,000 mg by mouth daily as needed for fever (for pain.).    Yes [provider]  allopurinol (ZYLOPRIM) 300 MG tablet Take 300 mg by mouth daily.   Yes [provider]  amLODipine (NORVASC) 5 MG tablet Take 1 tablet (5 mg total) by mouth daily. 11/18/19 03/01/20 Yes Shah, Pratik D, DO  cholecalciferol (VITAMIN D3) 25 MCG (1000 UNIT) tablet Take 1,000 Units by mouth daily.   Yes [provider]  ciprofloxacin (CIPRO) 250 MG tablet Take 250 mg by mouth 2 (two) times daily. For 10 days 08/11/20  Yes [provider]  docusate sodium (COLACE) 100 MG capsule Take 100 mg by mouth daily as needed for mild constipation.   Yes [provider]  gabapentin (NEURONTIN) 100 MG capsule Take 100 mg by mouth 3 (three) times daily. 08/04/20  Yes [provider]  lactose free nutrition (BOOST) LIQD Take 237 mLs by mouth 2 (two) times  daily between meals. Indefinitely. 06/15/20  Yes Derek Jack, MD  levothyroxine (SYNTHROID) 50 MCG tablet Take 50 mcg by mouth daily before breakfast.   Yes [provider]  Multiple Vitamin (MULTIVITAMIN WITH MINERALS) TABS tablet Take 1 tablet by mouth daily.   Yes [provider]  pantoprazole (PROTONIX) 40 MG tablet Take 40 mg by mouth daily.    Yes [provider]  predniSONE (DELTASONE) 5 MG tablet Take 1 tablet (5 mg total) by mouth daily with breakfast. 04/29/20  Yes Derek Jack, MD  rivaroxaban (XARELTO) 20 MG TABS tablet Take 20 mg by mouth in the morning.    Yes [provider]  timolol (BETIMOL) 0.5 % ophthalmic solution Place 1 drop into the left eye in the morning.   Yes [provider]  traMADol (ULTRAM) 50 MG tablet Take 1-2 tablets (50-100 mg total) by mouth every 6 (six) hours as needed. 07/05/20  Yes Burns, Wandra Feinstein, NP  lidocaine (LIDODERM) 5 % Place 1 patch onto the skin daily. Remove & Discard patch within 12 hours or as directed by MD 10/21/19   Henderly, Britni A, PA-C  ondansetron (ZOFRAN ODT) 4 MG disintegrating tablet Take 1 tablet (4 mg total) by mouth every 8 (eight) hours as needed for nausea or vomiting. 10/21/19   Henderly, Britni A, PA-C    Physical Exam: BP 121/72   Pulse 100   Temp 98.1  F (36.7 C) (Oral)   Resp (!) 24   Ht 6' (1.829 m)   Wt 81.6 kg   SpO2 97%   BMI 24.41 kg/m   . General: 87 y.o. year-old male well developed well nourished in no acute distress.  Alert and oriented x3. Marland Kitchen HEENT: NCAT, EOMI . Neck: Supple, trachea medial . Cardiovascular: Regular rate and rhythm with no rubs or gallops.  No thyromegaly or JVD noted.  No lower extremity edema. 2/4 pulses in all 4 extremities. Marland Kitchen Respiratory: Clear to auscultation with no wheezes or rales. Good inspiratory effort. . Abdomen: Soft nontender nondistended with normal bowel sounds x4 quadrants. . Muskuloskeletal: Limited ROM of RLE  due to right hip and right proximal femur pain.  No cyanosis, clubbing or edema noted bilaterally . Neuro: CN II-XII intact, strength, sensation, reflexes . Skin: No ulcerative lesions noted or rashes . Psychiatry: Judgement and insight appear normal. Mood is appropriate for condition and setting          Labs on Admission:  Basic Metabolic Panel: Recent Labs  Lab 08/13/20 1438  NA 132*  K 3.8  CL 101  CO2 22  GLUCOSE 123*  BUN 19  CREATININE 1.31*  CALCIUM 7.8*   Liver Function Tests: Recent Labs  Lab 08/13/20 1438  AST 19  ALT 12  ALKPHOS 91  BILITOT 0.9  PROT 6.1*  ALBUMIN 2.8*   No results for input(s): LIPASE, AMYLASE in the last 168 hours. No results for input(s): AMMONIA in the last 168 hours. CBC: Recent Labs  Lab 08/13/20 1438  WBC 10.5  NEUTROABS 7.8*  HGB 8.6*  HCT 27.7*  MCV 93.0  PLT 366   Cardiac Enzymes: No results for input(s): CKTOTAL, CKMB, CKMBINDEX, TROPONINI in the last 168 hours.  BNP (last 3 results) Recent Labs    09/03/19 0245  BNP 135.4*    ProBNP (last 3 results) No results for input(s): PROBNP in the last 8760 hours.  CBG: No results for input(s): GLUCAP in the last 168 hours.  Radiological Exams on Admission: DG Pelvis 1-2 Views  Result Date: 08/13/2020 CLINICAL DATA:  Status post fall x2 yesterday.  Syncopal episodes. EXAM: PELVIS - 1-2 VIEW COMPARISON:  None. FINDINGS: No acute bony or joint abnormality is identified. Evaluation of the right hip is limited as the patient is externally rotated. IMPRESSION: No acute finding. Evaluation of the right hip is limited by external rotation. Electronically Signed   By: Inge Rise M.D.   On: 08/13/2020 14:19   CT Head Wo Contrast  Result Date: 08/13/2020 CLINICAL DATA:  Dizziness. Syncopal episode. Fell twice yesterday. Hypotension. EXAM: CT HEAD WITHOUT CONTRAST TECHNIQUE: Contiguous axial images were obtained from the base of the skull through the vertex without  intravenous contrast. COMPARISON:  None. FINDINGS: Brain: No evidence of acute infarction, hemorrhage, hydrocephalus, extra-axial collection or mass lesion/mass effect. There is mild ventricular sulcal enlargement reflecting age-appropriate volume loss. Patchy areas of white matter hypoattenuation are also noted consistent with mild chronic microvascular ischemic change. Vascular: No hyperdense vessel or unexpected calcification. Skull: Normal. Negative for fracture or focal lesion. Sinuses/Orbits: Globes and orbits are unremarkable. Sinuses are clear. Other: None. IMPRESSION: 1. No acute intracranial abnormalities. 2. Age-appropriate volume loss and mild chronic microvascular ischemic change. Electronically Signed   By: Lajean Manes M.D.   On: 08/13/2020 16:08   CT Renal Stone Study  Result Date: 08/13/2020 CLINICAL DATA:  Hematuria. Syncopal episode yesterday with falls. Urinary blood clots. Being treated for  prostate cancer. Right hip pain and hematuria. EXAM: CT ABDOMEN AND PELVIS WITHOUT CONTRAST TECHNIQUE: Multidetector CT imaging of the abdomen and pelvis was performed following the standard protocol without IV contrast. COMPARISON:  11/17/2019 FINDINGS: Lower chest: Numerous new nodules within the lung bases right worse than left with the largest measuring 1.4 cm over the medial right lower lobe. Small left pleural effusion with minimal associated atelectasis. Calcified plaque over the right coronary artery and descending thoracic aorta. Hepatobiliary: Liver is somewhat small but unchanged without focal mass. Gallbladder and biliary tree are normal. Pancreas: Normal. Spleen: Normal. Adrenals/Urinary Tract: Adrenal glands are normal. Kidneys are normal in size with minimal stable prominence of the left intrarenal collecting system. No renal stones are present. Exophytic cyst over the upper pole right kidney unchanged. Mild right-sided hydronephrosis with dilatation of the right renal pelvis and mild  dilatation of the right ureter due to progressive enlargement of soft tissue mass over the right pelvic sidewall causing destruction of the adjacent right posterior acetabulum. This is compatible with known metastatic disease. Left ureter is normal. There is moderate hyperdense material filling just over half the bladder likely hemorrhagic material. Stomach/Bowel: Stomach and small bowel are normal. Appendix is not visualized. Mild diverticulosis of the colon. Vascular/Lymphatic: Calcified plaque over the abdominal aorta which is normal in caliber. Significant improvement in the previously seen bulky periaortic and iliac chain adenopathy. Reproductive: Evidence of previous prostatectomy. Other: Interval progression of large irregular mass over the right pelvic sidewall measuring approximately 7.2 x 9.2 cm. This mass causes destruction along the right posterior acetabulum unchanged. This mass abuts and appears to invade the adjacent bladder at the right UVJ it extends into the surgical bed of the prostate gland. The mass abuts the right anterolateral aspect of the rectum contacting 90 degrees of the circumference. This is compatible progression of patient's known metastatic disease. Musculoskeletal: Mild interval progression of diffuse osseous metastatic disease. IMPRESSION: 1. Evidence of patient's known metastatic prostate cancer with interval progression of patient's known large irregular mass over the right pelvic sidewall measuring approximately 7.2 x 9.2 cm causing destruction of the right posterior acetabulum. This mass abuts and appears to invade the adjacent bladder at the right UVJ with moderate hyperdense material filling half the bladder likely hemorrhagic debris. This mass also causes a degree of obstruction with mild right-sided hydroureteronephrosis. The mass abuts the right anterolateral aspect of the rectum. Numerous new pulmonary nodules within the lung bases right worse than left compatible with  metastatic disease. Small left pleural effusion with minimal associated atelectasis. Interval progression of known osseous metastatic disease. 2. Stable right renal cyst. 3. Mild colonic diverticulosis. 4. Aortic atherosclerosis.  Atherosclerotic coronary artery disease. Aortic Atherosclerosis (ICD10-I70.0). Electronically Signed   By: Elberta Fortis M.D.   On: 08/13/2020 16:27   DG Femur Min 2 Views Right  Result Date: 08/13/2020 CLINICAL DATA:  Syncopal episode yesterday with fall. Right leg pain. EXAM: RIGHT FEMUR 2 VIEWS COMPARISON:  None. FINDINGS: There is no evidence of fracture or other focal bone lesions. Minimal degenerative change of the right hip. Soft tissues are unremarkable. IMPRESSION: No acute findings. Electronically Signed   By: Elberta Fortis M.D.   On: 08/13/2020 14:19    EKG: I independently viewed the EKG done and my findings are as followed: A. fib with rate control.  QTc 534 ms  Assessment/Plan Present on Admission: . Hematuria . Unspecified atrial fibrillation (HCC) . Metastatic castration-sensitive adenocarcinoma of prostate (HCC)  Principal Problem:  Hematuria Active Problems:   Unspecified atrial fibrillation (HCC)   Metastatic castration-sensitive adenocarcinoma of prostate (HCC)   Symptomatic anemia   GI bleed   Generalized weakness   Accident due to mechanical fall without injury   Orthostatic hypotension   Right leg pain   Hyponatremia   Hypoalbuminemia   Prolonged QT interval   Generalized weakness in the setting of symptomatic anemia possibly secondary to hematuria and presumed GI bleed H/H= 8.6/27.7, this was 12.6/40.0 on 07/31/2020  Hematuria noted in Foley catheter, continue hand irrigation as suggested by urologist (Dr. Lovena Neighbours) consulted by ED physician. FOBT was positive Continue Protonix Continue to monitor H/H with plan to transfuse blood if H/H continues to worsen. Patient is currently hemodynamically stable Xarelto will be held at this  time Gastroenterology will be consulted for further recommendation No indication to admit patient to [facility] at this time per urology (based on ED physician medical record) Consider reconsulting with urology for worsening symptoms  Mechanical fall secondary to above Continue fall precaution and neurochecks Continue PT/OT eval and treat  Reported orthostatic hypotension IV hydration was provided Orthostatic BP will be checked and patient will be treated accordingly Continue fall precautions and neurochecks   Metastatic castration sensitive adenocarcinoma prostate Right leg pain possibly due to metastatic prostate cancer CT abdomen and pelvis showed interval progression of known prostate cancer with invasion of  the adjacent bladder at the right UVJ with moderate hyperdense material filling half the bladder likely hemorrhagic debris. Numerous new pulmonary nodules compatible with metastatic cancer noted as well as interval progression of known osseous metastatic disease Continue IV morphine 2 mg every 4 hours as needed Patient follows with Dr. Delton Coombes Palliative consult to be placed for goals of care  Prolonged QTc (534 ms) Avoid QT prolonging drugs Magnesium level will be checked  Hyponatremia Na 132, IV hydration was provided Continue to monitor sodium level with morning labs  Hypoalbuminemia possibly secondary to moderate protein calorie malnutrition Protein supplements will be provided  Atrial fibrillation EKG showed A. fib with rate controlled; patient was not on any rate control medication Xarelto will be held at this time due to hematuria and presumed GI bleed   DVT prophylaxis: SCDs  Code Status: Full code  Family Communication: None at bedside  Disposition Plan:  Patient is from:                        home Anticipated DC to:                   SNF or family members home Anticipated DC date:               2-3 days Anticipated DC barriers:           Patient  is not stable to be discharged home at this time due to hematuria and worsening metastatic prostate cancer which required inpatient management  Consults called: Gastroenterology, palliative care team  Admission status: Inpatient   Bernadette Hoit MD [facility]  08/13/2020, 9:56 PM

## 2020-08-14 ENCOUNTER — Encounter (HOSPITAL_COMMUNITY)
Admission: EM | Disposition: A | Discharge status: Home Health | Payer: Self-pay | Source: Home / Self Care | Attending: Internal Medicine

## 2020-08-14 ENCOUNTER — Inpatient Hospital Stay (HOSPITAL_COMMUNITY): Payer: Medicare Other | Admitting: Registered Nurse

## 2020-08-14 DIAGNOSIS — R310 Gross hematuria: Secondary | ICD-10-CM | POA: Diagnosis not present

## 2020-08-14 HISTORY — PX: CYSTOSCOPY: SHX5120

## 2020-08-14 LAB — CBC
HCT: 20.2 % — ABNORMAL LOW (ref 39.0–52.0)
Hemoglobin: 6.2 g/dL — CL (ref 13.0–17.0)
MCH: 29 pg (ref 26.0–34.0)
MCHC: 30.7 g/dL (ref 30.0–36.0)
MCV: 94.4 fL (ref 80.0–100.0)
Platelets: 295 10*3/uL (ref 150–400)
RBC: 2.14 MIL/uL — ABNORMAL LOW (ref 4.22–5.81)
RDW: 15.5 % (ref 11.5–15.5)
WBC: 13.8 10*3/uL — ABNORMAL HIGH (ref 4.0–10.5)
nRBC: 0 % (ref 0.0–0.2)

## 2020-08-14 LAB — POCT I-STAT EG7
Acid-base deficit: 6 mmol/L — ABNORMAL HIGH (ref 0.0–2.0)
Bicarbonate: 21 mmol/L (ref 20.0–28.0)
Calcium, Ion: 1.05 mmol/L — ABNORMAL LOW (ref 1.15–1.40)
HCT: 21 % — ABNORMAL LOW (ref 39.0–52.0)
Hemoglobin: 7.1 g/dL — ABNORMAL LOW (ref 13.0–17.0)
O2 Saturation: 56 %
Potassium: 4.3 mmol/L (ref 3.5–5.1)
Sodium: 137 mmol/L (ref 135–145)
TCO2: 22 mmol/L (ref 22–32)
pCO2, Ven: 49.1 mmHg (ref 44.0–60.0)
pH, Ven: 7.239 — ABNORMAL LOW (ref 7.250–7.430)
pO2, Ven: 35 mmHg (ref 32.0–45.0)

## 2020-08-14 LAB — COMPREHENSIVE METABOLIC PANEL
ALT: 11 U/L (ref 0–44)
AST: 20 U/L (ref 15–41)
Albumin: 3.3 g/dL — ABNORMAL LOW (ref 3.5–5.0)
Alkaline Phosphatase: 78 U/L (ref 38–126)
Anion gap: 9 (ref 5–15)
BUN: 21 mg/dL (ref 8–23)
CO2: 21 mmol/L — ABNORMAL LOW (ref 22–32)
Calcium: 8.1 mg/dL — ABNORMAL LOW (ref 8.9–10.3)
Chloride: 104 mmol/L (ref 98–111)
Creatinine, Ser: 1.55 mg/dL — ABNORMAL HIGH (ref 0.61–1.24)
GFR, Estimated: 44 mL/min — ABNORMAL LOW (ref >60)
Glucose, Bld: 117 mg/dL — ABNORMAL HIGH (ref 70–99)
Potassium: 4 mmol/L (ref 3.5–5.1)
Sodium: 134 mmol/L — ABNORMAL LOW (ref 135–145)
Total Bilirubin: 0.9 mg/dL (ref 0.3–1.2)
Total Protein: 5.7 g/dL — ABNORMAL LOW (ref 6.5–8.1)

## 2020-08-14 LAB — PHOSPHORUS: Phosphorus: 2.8 mg/dL (ref 2.5–4.6)

## 2020-08-14 LAB — PROTIME-INR
INR: 1.4 — ABNORMAL HIGH (ref 0.8–1.2)
Prothrombin Time: 16.5 seconds — ABNORMAL HIGH (ref 11.4–15.2)

## 2020-08-14 LAB — APTT: aPTT: 37 seconds — ABNORMAL HIGH (ref 24–36)

## 2020-08-14 LAB — PREPARE RBC (CROSSMATCH)

## 2020-08-14 LAB — HEMOGLOBIN AND HEMATOCRIT, BLOOD
HCT: 24.8 % — ABNORMAL LOW (ref 39.0–52.0)
Hemoglobin: 7.7 g/dL — ABNORMAL LOW (ref 13.0–17.0)

## 2020-08-14 LAB — SARS CORONAVIRUS 2 (TAT 6-24 HRS): SARS Coronavirus 2: NEGATIVE

## 2020-08-14 LAB — MAGNESIUM: Magnesium: 2.1 mg/dL (ref 1.7–2.4)

## 2020-08-14 SURGERY — CYSTOSCOPY
Anesthesia: General

## 2020-08-14 MED ORDER — SODIUM CHLORIDE 0.9 % IV SOLN: INTRAVENOUS | Status: DC

## 2020-08-14 MED ORDER — PHENYLEPHRINE HCL (PRESSORS) 10 MG/ML IV SOLN
INTRAVENOUS | Status: DC | PRN
  Administered 2020-08-14: 120 ug via INTRAVENOUS
  Administered 2020-08-14: 80 ug via INTRAVENOUS

## 2020-08-14 MED ORDER — FENTANYL CITRATE (PF) 100 MCG/2ML IJ SOLN: 25.0000 ug | INTRAMUSCULAR | Status: DC | PRN

## 2020-08-14 MED ORDER — CEFAZOLIN SODIUM-DEXTROSE 2-4 GM/100ML-% IV SOLN: 2.0000 g | Freq: Once | INTRAVENOUS | Status: DC

## 2020-08-14 MED ORDER — FENTANYL CITRATE (PF) 100 MCG/2ML IJ SOLN
INTRAMUSCULAR | Status: DC | PRN
  Administered 2020-08-14: 25 ug via INTRAVENOUS
  Administered 2020-08-14: 50 ug via INTRAVENOUS
  Administered 2020-08-14: 25 ug via INTRAVENOUS

## 2020-08-14 MED ORDER — CEFAZOLIN SODIUM-DEXTROSE 2-3 GM-%(50ML) IV SOLR
INTRAVENOUS | Status: DC | PRN
  Administered 2020-08-14: 2 g via INTRAVENOUS

## 2020-08-14 MED ORDER — SODIUM CHLORIDE 0.9% IV SOLUTION: Freq: Once | INTRAVENOUS | Status: AC

## 2020-08-14 MED ORDER — WATER FOR IRRIGATION, STERILE IR SOLN
Status: DC | PRN
  Administered 2020-08-14 (×4): 3000 mL via INTRAVESICAL

## 2020-08-14 MED ORDER — DEXAMETHASONE SODIUM PHOSPHATE 10 MG/ML IJ SOLN
INTRAMUSCULAR | Status: DC | PRN
  Administered 2020-08-14: 8 mg via INTRAVENOUS

## 2020-08-14 MED ORDER — SUCCINYLCHOLINE CHLORIDE 200 MG/10ML IV SOSY
PREFILLED_SYRINGE | INTRAVENOUS | Status: DC | PRN
  Administered 2020-08-14: 80 mg via INTRAVENOUS

## 2020-08-14 MED ORDER — ONDANSETRON HCL 4 MG/2ML IJ SOLN: 4.0000 mg | Freq: Once | INTRAMUSCULAR | Status: DC | PRN

## 2020-08-14 MED ORDER — PHENYLEPHRINE HCL-NACL 10-0.9 MG/250ML-% IV SOLN
INTRAVENOUS | Status: DC | PRN
  Administered 2020-08-14: 50 ug/min via INTRAVENOUS

## 2020-08-14 MED ORDER — LIDOCAINE 2% (20 MG/ML) 5 ML SYRINGE
INTRAMUSCULAR | Status: DC | PRN
  Administered 2020-08-14: 60 mg via INTRAVENOUS

## 2020-08-14 MED ORDER — CHLORHEXIDINE GLUCONATE CLOTH 2 % EX PADS
6.0000 | MEDICATED_PAD | Freq: Every day | CUTANEOUS | Status: DC
  Administered 2020-08-14 – 2020-08-15 (×2): 6 via TOPICAL

## 2020-08-14 MED ORDER — PROPOFOL 10 MG/ML IV BOLUS
INTRAVENOUS | Status: DC | PRN
  Administered 2020-08-14: 100 mg via INTRAVENOUS

## 2020-08-14 MED ORDER — ACETAMINOPHEN 10 MG/ML IV SOLN
INTRAVENOUS | Status: DC | PRN
  Administered 2020-08-14: 1000 mg via INTRAVENOUS

## 2020-08-14 MED ORDER — SODIUM CHLORIDE 0.9 % IV SOLN: INTRAVENOUS | Status: DC | PRN

## 2020-08-14 MED ORDER — LEVOTHYROXINE SODIUM 50 MCG PO TABS
50.0000 ug | ORAL_TABLET | Freq: Every day | ORAL | Status: DC
  Administered 2020-08-14 – 2020-08-16 (×3): 50 ug via ORAL
  Filled 2020-08-14 (×3): qty 1

## 2020-08-14 MED ORDER — AMLODIPINE BESYLATE 5 MG PO TABS: 5.0000 mg | ORAL_TABLET | Freq: Every day | ORAL | Status: DC

## 2020-08-14 MED ORDER — TIMOLOL MALEATE 0.5 % OP SOLN
1.0000 [drp] | Freq: Every morning | OPHTHALMIC | Status: DC
  Administered 2020-08-14 – 2020-08-16 (×3): 1 [drp] via OPHTHALMIC
  Filled 2020-08-14: qty 5

## 2020-08-14 MED ORDER — SODIUM CHLORIDE 0.9 % IV SOLN
510.0000 mg | Freq: Once | INTRAVENOUS | Status: AC
  Administered 2020-08-14: 510 mg via INTRAVENOUS
  Filled 2020-08-14: qty 170

## 2020-08-14 MED ORDER — PREDNISONE 5 MG PO TABS
5.0000 mg | ORAL_TABLET | Freq: Every day | ORAL | Status: DC
  Administered 2020-08-15 – 2020-08-16 (×2): 5 mg via ORAL
  Filled 2020-08-14 (×2): qty 1

## 2020-08-14 MED ORDER — CALCIUM CHLORIDE 10 % IV SOLN
INTRAVENOUS | Status: DC | PRN
  Administered 2020-08-14: 1 g via INTRAVENOUS

## 2020-08-14 MED ORDER — HYDROCODONE-ACETAMINOPHEN 7.5-325 MG PO TABS: 1.0000 | ORAL_TABLET | Freq: Once | ORAL | Status: DC | PRN

## 2020-08-14 MED ORDER — SODIUM CHLORIDE 0.9 % IR SOLN: 3000.0000 mL | Status: DC

## 2020-08-14 MED ORDER — ALBUMIN HUMAN 5 % IV SOLN
INTRAVENOUS | Status: DC | PRN
Start: 2020-08-14 — End: 2020-08-14

## 2020-08-14 MED ORDER — ONDANSETRON HCL 4 MG/2ML IJ SOLN
INTRAMUSCULAR | Status: DC | PRN
  Administered 2020-08-14: 4 mg via INTRAVENOUS

## 2020-08-14 SURGICAL SUPPLY — 17 items
BAG URINE DRAIN 2000ML AR STRL (UROLOGICAL SUPPLIES) ×2 IMPLANT
BAG URO CATCHER STRL LF (MISCELLANEOUS) ×2 IMPLANT
CATH FOLEY 3WAY 30CC 22FR (CATHETERS) ×2 IMPLANT
CATH URET 5FR 28IN OPEN ENDED (CATHETERS) ×2 IMPLANT
CLOTH BEACON ORANGE TIMEOUT ST (SAFETY) ×2 IMPLANT
GLOVE SURG ENC TEXT LTX SZ7.5 (GLOVE) ×2 IMPLANT
GOWN STRL REUS W/TWL XL LVL3 (GOWN DISPOSABLE) ×2 IMPLANT
GUIDEWIRE STR DUAL SENSOR (WIRE) IMPLANT
GUIDEWIRE ZIPWRE .038 STRAIGHT (WIRE) ×2 IMPLANT
KIT TURNOVER KIT A (KITS) IMPLANT
MANIFOLD NEPTUNE II (INSTRUMENTS) ×2 IMPLANT
PACK CYSTO (CUSTOM PROCEDURE TRAY) ×2 IMPLANT
SYR 20ML LL LF (SYRINGE) ×2 IMPLANT
SYR TOOMEY IRRIG 70ML (MISCELLANEOUS) ×2
SYRINGE TOOMEY IRRIG 70ML (MISCELLANEOUS) ×1 IMPLANT
TUBING CONNECTING 10 (TUBING) ×2 IMPLANT
TUBING UROLOGY SET (TUBING) ×2 IMPLANT

## 2020-08-14 NOTE — Progress Notes (Signed)
PROGRESS NOTE    Steven Gutierrez  MWN:[id number] DOB: 26-Jul-1933 DOA: 08/13/2020 PCP: [name], MD     Brief Narrative:  Steven Gutierrez is a 87 y.o. 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 Delton Coombes), atrial fibrillation, normocytic anemia who presents to the emergency department via EMS due to mechanical falls x2 at home yesterday.  Patient lives alone and complained of about 4 weeks of blood in urine with blood clots which is associated with increasing weakness.  He sustained a fall at home twice yesterday. Patient was seen by son this morning and activated EMS due to patient's worsening weakness with falls. On arrival of EMS team, he was noted to have increased dizziness on standing, BP was checked in supine position (141/68) and this was repeated on standing (66/43).  He was then taken to the ED for further evaluation.    In the emergency department, work-up revealed anemia, significant hematuria.  CT abdomen pelvis revealed interval progression of patient's known large irregular mass over the right pelvic sidewall measuring approximately 7.2 x 9.2 cm causing destruction of the right posterior acetabulum. This mass abuts and appears to invade the adjacent bladder at the right UVJ with moderate hyperdense material filling half the bladder likely hemorrhagic debris. This mass also causes a degree of obstruction with mild right-sided hydroureteronephrosis. The mass abuts the right anterolateral aspect of the rectum. Numerous new pulmonary nodules within the lung bases right worse than left compatible with metastatic disease. Small left pleural effusion with minimal associated atelectasis. Interval progression of known osseous metastatic disease.  Urology was consulted and patient transferred to [facility] for cystoscopy due to urinary clot retention.  New events last 24 hours /  Subjective: Patient very somnolent this morning, likely due to lack of sleep after hospital admission and OR overnight.  Patient is easily arousable but quickly falls back asleep.  Hemoglobin dropped to 6.2, transfusion ordered.  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 Lovena Neighbours 1/16 -FOBT was positive but patient did not have any complaints of GI bleeding prior to admission.  Suspect his anemia is more due to source of hematuria but we will continue to monitor hemoglobin as well as any signs of overt GI blood loss.  Per son, patient has extensive hemorrhoids and has some bleeding on and off at baseline.  Has history of peptic ulcer disease, in the last 3 years had endoscopy in [place], [place]. BUN only 21 today  -Continue to hold Xarelto -Urology following -Transfuse 1u pRBC today and monitor hemoglobin  -IV feraheme   Generalized weakness with falls at home -PT OT  Orthostatic hypotension -Likely secondary to blood loss anemia -Continue to monitor, repeat today   Metastatic prostate cancer -Follows with Dr. Delton Coombes -CT scan revealed progression of disease  -Has been on Abiraterone, Xgeva, Prednisone  -Palliative care consulted for goals of care  conversation  Hyponatremia -Improving  -IVF   A. Fib -Hold Xarelto -Telemetry   AKI -Baseline Cr 1 -IVF and avoid nephrotoxins   HTN -Hold Norvasc in  setting of orthostatic hypotension on admission  Hypothyroidism -Continue Synthroid     DVT prophylaxis:  SCDs Start: 08/13/20 2211  Code Status: Full code Family Communication: No family at bedside; I spoke at length with patient's son > 15 min regarding patient's current status, treatments, plan for the next 24 hours.  I did recommend palliative care conversation with him, his brother and patient.  I confirmed full code status for now, although this may change pending palliative care conversation as well as patient's clinical course in the next 24 hours. Disposition Plan:  Status is: Inpatient  Remains inpatient appropriate because:Hemodynamically unstable, IV treatments appropriate due to intensity of illness or inability to take PO and Inpatient level of care appropriate due to severity of illness   Dispo: The patient is from: Home              Anticipated d/c is to: Home              Anticipated d/c date is: 2 days              Patient currently is not medically stable to d/c.  Transfuse blood today.  Continue to monitor hematuria as well as any signs of overt GI blood loss.  He will need PT OT.   Consultants:   Urology  Palliative care medicine  Procedures:   Cystoscopy with clot evacuation with fulguration and bladder biopsy by Dr. Dwaine Deter  Antimicrobials:  Anti-infectives (From admission, onward)   Start     Dose/Rate Route Frequency Ordered Stop   08/14/20 0415  ceFAZolin (ANCEF) IVPB 2g/100 mL premix        2 g 200 mL/hr over 30 Minutes Intravenous  Once 08/14/20 0327          Objective: Vitals:   08/14/20 0500 08/14/20 0515 08/14/20 0530 08/14/20 0603  BP: (!) 111/56 (!) 122/55 (!) 144/58 (!) 161/80  Pulse:  95 96 (!) 105  Resp: 18 14 15 18   Temp:   98.4 F (36.9 C) 98 F (36.7 C)  TempSrc:    Oral  SpO2: 99% 100% 97% 100%  Weight:      Height:        Intake/Output Summary (Last 24 hours) at 08/14/2020 0959 Last data filed at 08/14/2020  0611 Gross per 24 hour  Intake 2450 ml  Output 9350 ml  Net -6900 ml   Filed Weights   08/13/20 1230  Weight: 81.6 kg    Examination:  General exam: Appears calm and comfortable  Respiratory system: Clear to auscultation. Respiratory effort normal. No respiratory distress. No conversational dyspnea.  Cardiovascular system: S1 & S2 heard. No murmurs. No pedal edema. Gastrointestinal system: Abdomen is nondistended, soft and nontender. Normal bowel sounds heard. Central nervous system: Alert to voice  Extremities: Symmetric in appearance  Skin: No rashes, lesions or ulcers on exposed skin     Data Reviewed: I have personally reviewed following labs and imaging studies  CBC: Recent Labs  Lab 08/13/20 1438 08/14/20 0426 08/14/20 0619  WBC 10.5  --  13.8*  NEUTROABS 7.8*  --   --   HGB 8.6* 7.1* 6.2*  HCT 27.7* 21.0* 20.2*  MCV 93.0  --  94.4  PLT 366  --  [id number]   Basic Metabolic Panel: Recent Labs  Lab 08/13/20  1438 08/14/20 0426 08/14/20 0619  NA 132* 137 134*  K 3.8 4.3 4.0  CL 101  --  104  CO2 22  --  21*  GLUCOSE 123*  --  117*  BUN 19  --  21  CREATININE 1.31*  --  1.55*  CALCIUM 7.8*  --  8.1*  MG  --   --  2.1  PHOS  --   --  2.8   GFR: Estimated Creatinine Clearance: 38.2 mL/min (A) (by C-G formula based on SCr of 1.55 mg/dL (H)). Liver Function Tests: Recent Labs  Lab 08/13/20 1438 08/14/20 0619  AST 19 20  ALT 12 11  ALKPHOS 91 78  BILITOT 0.9 0.9  PROT 6.1* 5.7*  ALBUMIN 2.8* 3.3*   No results for input(s): LIPASE, AMYLASE in the last 168 hours. No results for input(s): AMMONIA in the last 168 hours. Coagulation Profile: Recent Labs  Lab 08/14/20 0619  INR 1.4*   Cardiac Enzymes: No results for input(s): CKTOTAL, CKMB, CKMBINDEX, TROPONINI in the last 168 hours. BNP (last 3 results) No results for input(s): PROBNP in the last 8760 hours. HbA1C: No results for input(s): HGBA1C in the last 72 hours. CBG: No results for input(s):  GLUCAP in the last 168 hours. Lipid Profile: No results for input(s): CHOL, HDL, LDLCALC, TRIG, CHOLHDL, LDLDIRECT in the last 72 hours. Thyroid Function Tests: No results for input(s): TSH, T4TOTAL, FREET4, T3FREE, THYROIDAB in the last 72 hours. Anemia Panel: Recent Labs    08/13/20 1720  VITAMINB12 535  FOLATE 19.1  FERRITIN 186  TIBC 211*  IRON 16*  RETICCTPCT 3.2*   Sepsis Labs: No results for input(s): PROCALCITON, LATICACIDVEN in the last 168 hours.  Recent Results (from the past 240 hour(s))  SARS CORONAVIRUS 2 (TAT 6-24 HRS) Nasopharyngeal Nasopharyngeal Swab     Status: None   Collection Time: 08/13/20  1:23 PM   Specimen: Nasopharyngeal Swab  Result Value Ref Range Status   SARS Coronavirus 2 NEGATIVE NEGATIVE Final    Comment: (NOTE) SARS-CoV-2 target nucleic acids are NOT DETECTED.  The SARS-CoV-2 RNA is generally detectable in upper and lower respiratory specimens during the acute phase of infection. Negative results do not preclude SARS-CoV-2 infection, do not rule out co-infections with other pathogens, and should not be used as the sole basis for treatment or other patient management decisions. Negative results must be combined with clinical observations, patient history, and epidemiological information. The expected result is Negative.  Fact Sheet for Patients: SugarRoll.be  Fact Sheet for Healthcare Providers: https://www.woods-mathews.com/  This test is not yet approved or cleared by the [place] FDA and  has been authorized for detection and/or diagnosis of SARS-CoV-2 by FDA under an Emergency Use Authorization (EUA). This EUA will remain  in effect (meaning this test can be used) for the duration of the COVID-19 declaration under Se ction 564(b)(1) of the Act, 21 U.S.C. section 360bbb-3(b)(1), unless the authorization is terminated or revoked sooner.  Performed at [facility], [place] [address], [place], [place] [address]   Resp Panel by RT-PCR (Flu A&B, Covid) Nasopharyngeal Swab     Status: None   Collection Time: 08/13/20  3:12 PM   Specimen: Nasopharyngeal Swab; Nasopharyngeal(NP) swabs in vial transport medium  Result Value Ref Range Status   SARS Coronavirus 2 by RT PCR NEGATIVE NEGATIVE Final    Comment: (NOTE) SARS-CoV-2 target nucleic acids are NOT DETECTED.  The SARS-CoV-2 RNA is generally detectable in upper respiratory specimens  during the acute phase of infection. The lowest concentration of SARS-CoV-2 viral copies this assay can detect is 138 copies/mL. A negative result does not preclude SARS-Cov-2 infection and should not be used as the sole basis for treatment or other patient management decisions. A negative result may occur with  improper specimen collection/handling, submission of specimen other than nasopharyngeal swab, presence of viral mutation(s) within the areas targeted by this assay, and inadequate number of viral copies(<138 copies/mL). A negative result must be combined with clinical observations, patient history, and epidemiological information. The expected result is Negative.  Fact Sheet for Patients:  EntrepreneurPulse.com.au  Fact Sheet for Healthcare Providers:  IncredibleEmployment.be  This test is no t yet approved or cleared by the [place] FDA and  has been authorized for detection and/or diagnosis of SARS-CoV-2 by FDA under an Emergency Use Authorization (EUA). This EUA will remain  in effect (meaning this test can be used) for the duration of the COVID-19 declaration under Section 564(b)(1) of the Act, 21 U.S.C.section 360bbb-3(b)(1), unless the authorization is terminated  or revoked sooner.       Influenza A by PCR NEGATIVE NEGATIVE Final   Influenza B by PCR NEGATIVE NEGATIVE Final    Comment: (NOTE) The Xpert Xpress SARS-CoV-2/FLU/RSV plus assay is intended as an aid in the  diagnosis of influenza from Nasopharyngeal swab specimens and should not be used as a sole basis for treatment. Nasal washings and aspirates are unacceptable for Xpert Xpress SARS-CoV-2/FLU/RSV testing.  Fact Sheet for Patients: EntrepreneurPulse.com.au  Fact Sheet for Healthcare Providers: IncredibleEmployment.be  This test is not yet approved or cleared by the [place] FDA and has been authorized for detection and/or diagnosis of SARS-CoV-2 by FDA under an Emergency Use Authorization (EUA). This EUA will remain in effect (meaning this test can be used) for the duration of the COVID-19 declaration under Section 564(b)(1) of the Act, 21 U.S.C. section 360bbb-3(b)(1), unless the authorization is terminated or revoked.  Performed at [facility], [address], [place], [place] [address]       Radiology Studies: DG Pelvis 1-2 Views  Result Date: 08/13/2020 CLINICAL DATA:  Status post fall x2 yesterday.  Syncopal episodes. EXAM: PELVIS - 1-2 VIEW COMPARISON:  None. FINDINGS: No acute bony or joint abnormality is identified. Evaluation of the right hip is limited as the patient is externally rotated. IMPRESSION: No acute finding. Evaluation of the right hip is limited by external rotation. Electronically Signed   By: Inge Rise M.D.   On: 08/13/2020 14:19   CT Head Wo Contrast  Result Date: 08/13/2020 CLINICAL DATA:  Dizziness. Syncopal episode. Fell twice yesterday. Hypotension. EXAM: CT HEAD WITHOUT CONTRAST TECHNIQUE: Contiguous axial images were obtained from the base of the skull through the vertex without intravenous contrast. COMPARISON:  None. FINDINGS: Brain: No evidence of acute infarction, hemorrhage, hydrocephalus, extra-axial collection or mass lesion/mass effect. There is mild ventricular sulcal enlargement reflecting age-appropriate volume loss. Patchy areas of white matter hypoattenuation are also noted consistent with mild  chronic microvascular ischemic change. Vascular: No hyperdense vessel or unexpected calcification. Skull: Normal. Negative for fracture or focal lesion. Sinuses/Orbits: Globes and orbits are unremarkable. Sinuses are clear. Other: None. IMPRESSION: 1. No acute intracranial abnormalities. 2. Age-appropriate volume loss and mild chronic microvascular ischemic change. Electronically Signed   By: Lajean Manes M.D.   On: 08/13/2020 16:08   CT Renal Stone Study  Result Date: 08/13/2020 CLINICAL DATA:  Hematuria. Syncopal episode yesterday with falls. Urinary blood clots. Being  treated for prostate cancer. Right hip pain and hematuria. EXAM: CT ABDOMEN AND PELVIS WITHOUT CONTRAST TECHNIQUE: Multidetector CT imaging of the abdomen and pelvis was performed following the standard protocol without IV contrast. COMPARISON:  11/17/2019 FINDINGS: Lower chest: Numerous new nodules within the lung bases right worse than left with the largest measuring 1.4 cm over the medial right lower lobe. Small left pleural effusion with minimal associated atelectasis. Calcified plaque over the right coronary artery and descending thoracic aorta. Hepatobiliary: Liver is somewhat small but unchanged without focal mass. Gallbladder and biliary tree are normal. Pancreas: Normal. Spleen: Normal. Adrenals/Urinary Tract: Adrenal glands are normal. Kidneys are normal in size with minimal stable prominence of the left intrarenal collecting system. No renal stones are present. Exophytic cyst over the upper pole right kidney unchanged. Mild right-sided hydronephrosis with dilatation of the right renal pelvis and mild dilatation of the right ureter due to progressive enlargement of soft tissue mass over the right pelvic sidewall causing destruction of the adjacent right posterior acetabulum. This is compatible with known metastatic disease. Left ureter is normal. There is moderate hyperdense material filling just over half the bladder likely  hemorrhagic material. Stomach/Bowel: Stomach and small bowel are normal. Appendix is not visualized. Mild diverticulosis of the colon. Vascular/Lymphatic: Calcified plaque over the abdominal aorta which is normal in caliber. Significant improvement in the previously seen bulky periaortic and iliac chain adenopathy. Reproductive: Evidence of previous prostatectomy. Other: Interval progression of large irregular mass over the right pelvic sidewall measuring approximately 7.2 x 9.2 cm. This mass causes destruction along the right posterior acetabulum unchanged. This mass abuts and appears to invade the adjacent bladder at the right UVJ it extends into the surgical bed of the prostate gland. The mass abuts the right anterolateral aspect of the rectum contacting 90 degrees of the circumference. This is compatible progression of patient's known metastatic disease. Musculoskeletal: Mild interval progression of diffuse osseous metastatic disease. IMPRESSION: 1. Evidence of patient's known metastatic prostate cancer with interval progression of patient's known large irregular mass over the right pelvic sidewall measuring approximately 7.2 x 9.2 cm causing destruction of the right posterior acetabulum. This mass abuts and appears to invade the adjacent bladder at the right UVJ with moderate hyperdense material filling half the bladder likely hemorrhagic debris. This mass also causes a degree of obstruction with mild right-sided hydroureteronephrosis. The mass abuts the right anterolateral aspect of the rectum. Numerous new pulmonary nodules within the lung bases right worse than left compatible with metastatic disease. Small left pleural effusion with minimal associated atelectasis. Interval progression of known osseous metastatic disease. 2. Stable right renal cyst. 3. Mild colonic diverticulosis. 4. Aortic atherosclerosis.  Atherosclerotic coronary artery disease. Aortic Atherosclerosis (ICD10-I70.0). Electronically Signed    By: Marin Olp M.D.   On: 08/13/2020 16:27   DG Femur Min 2 Views Right  Result Date: 08/13/2020 CLINICAL DATA:  Syncopal episode yesterday with fall. Right leg pain. EXAM: RIGHT FEMUR 2 VIEWS COMPARISON:  None. FINDINGS: There is no evidence of fracture or other focal bone lesions. Minimal degenerative change of the right hip. Soft tissues are unremarkable. IMPRESSION: No acute findings. Electronically Signed   By: Marin Olp M.D.   On: 08/13/2020 14:19      Scheduled Meds: . sodium chloride   Intravenous Once  . Chlorhexidine Gluconate Cloth  6 each Topical Daily  . feeding supplement  237 mL Oral BID BM  . pantoprazole (PROTONIX) IV  40 mg Intravenous Q12H   Continuous  Infusions: .  ceFAZolin (ANCEF) IV    . ferumoxytol    . sodium chloride irrigation       LOS: 1 day      Time spent: 45 minutes   Dessa Phi, DO [facility] 08/14/2020, 9:59 AM   Available via Epic secure chat 7am-7pm After these hours, please refer to coverage provider listed on [email or web address]

## 2020-08-14 NOTE — Anesthesia Procedure Notes (Signed)
Procedure Name: Intubation Date/Time: 08/14/2020 3:44 AM Performed by: Lissa Morales, CRNA Pre-anesthesia Checklist: Patient identified, Emergency Drugs available, Suction available and Patient being monitored Patient Re-evaluated:Patient Re-evaluated prior to induction Oxygen Delivery Method: Circle system utilized Preoxygenation: Pre-oxygenation with 100% oxygen Induction Type: IV induction Ventilation: Mask ventilation without difficulty Laryngoscope Size: Mac and 4 Grade View: Grade II Tube type: Oral Tube size: 7.5 mm Number of attempts: 1 Airway Equipment and Method: Stylet and Oral airway Placement Confirmation: ETT inserted through vocal cords under direct vision,  positive ETCO2 and breath sounds checked- equal and bilateral Secured at: 22 cm Tube secured with: Tape Dental Injury: Teeth and Oropharynx as per pre-operative assessment

## 2020-08-14 NOTE — Anesthesia Postprocedure Evaluation (Signed)
Anesthesia Post Note  Patient: Makoa Satz  Procedure(s) Performed: Cystoscopy with clot evacuation, bladder mass biopsy, fulguration, insertion of foley catheter for continuous irrigation (N/A )     Patient location during evaluation: PACU Anesthesia Type: General Level of consciousness: awake and alert, oriented and patient cooperative Pain management: pain level controlled Vital Signs Assessment: post-procedure vital signs reviewed and stable Respiratory status: spontaneous breathing, nonlabored ventilation and respiratory function stable Cardiovascular status: blood pressure returned to baseline and stable Postop Assessment: no apparent nausea or vomiting Anesthetic complications: no Comments: Significant clot burden evacuated intraop, Hb 7.1 from 8.9 in OR. Will send type and screen and hold off on transfusing as patient is hemodynamically stable in PACU, off pressors.    No complications documented.  Last Vitals:  Vitals:   08/14/20 0315 08/14/20 0330  BP: 128/80 119/65  Pulse:  100  Resp:  18  Temp:    SpO2:  100%    Last Pain:  Vitals:   08/14/20 0300  TempSrc:   PainSc: 0-No pain                 Pervis Hocking

## 2020-08-14 NOTE — Progress Notes (Signed)
CRITICAL VALUE ALERT  Critical Value:  hgb 6.2  Date & Time Notied:  08/14/20  Provider Notified: Kennon Holter, NP  Orders Received/Actions taken: Awaiting new orders.

## 2020-08-14 NOTE — Progress Notes (Signed)
Patient was admitted due to hematuria. Urologist was consulted by ED physician and recommended hand irrigation of the bladder, unfortunately, RN reported that further irrigation was not feasible due to blockage of the Foley with blood clots. Decision was made for patient to be transferred to [facility] by Dr. Lovena Neighbours. Hospitalist service at [facility] will still continue to follow-up with the patient

## 2020-08-14 NOTE — Transfer of Care (Signed)
Immediate Anesthesia Transfer of Care Note  Patient: Steven Gutierrez  Procedure(s) Performed: Cystoscopy with clot evacuation (N/A )  Patient Location: PACU  Anesthesia Type:General  Level of Consciousness: awake, alert , oriented and patient cooperative  Airway & Oxygen Therapy: Patient Spontanous Breathing and Patient connected to face mask oxygen  Post-op Assessment: Report given to RN, Post -op Vital signs reviewed and stable and Patient moving all extremities X 4  Post vital signs: stable  Last Vitals:  Vitals Value Taken Time  BP    Temp    Pulse    Resp    SpO2      Last Pain:  Vitals:   08/14/20 0300  TempSrc:   PainSc: 0-No pain         Complications: No complications documented.

## 2020-08-14 NOTE — Op Note (Signed)
Operative Note  Preoperative diagnosis:  1.  Clot urinary retention 2.  Metastatic prostate cancer 3.  Right hydronephrosis  Postoperative diagnosis: 1.  Clot urinary retention 2.  Metastatic prostate cancer with invasion into the right lateral aspects of the bladder with complete involvement of the right ureteral orifice 3.  Right hydronephrosis due to occlusion of the right ureteral orifice from pelvic mass  Procedure(s): 1.  Cystoscopy with clot evacuation with fulguration and bladder biopsy  Surgeon: Ellison Hughs, MD  Assistants:  None  Anesthesia:  General  Complications:  None  EBL: Approximately 1 L of well-formed clot was evacuated from the bladder  Specimens: 1.  Bladder biopsy  Drains/Catheters: 1.  22 French three-way Foley catheter with 10 mL in the balloon  Intraoperative findings:   1. 1 mL of well-formed clot was evacuated from the bladder 2. Large sessile mass involving the right lateral wall of the bladder  3. Right ureteral orifice was unidentifiable due to right bladder wall mass  Indication:  Steven Gutierrez is a 87 y.o. male with a known history of metastatic prostate cancer who presented to the [facility] emergency department with gross hematuria, progressive weakness and recent falls from standing.  CT scan showed a markedly distended bladder full of clot as well as evidence of widespread metastatic cancer.  He failed catheter irrigation due to his tremendous clot burden within the bladder.  He has been consented for the above procedures, voices understanding and wishes to proceed.  Description of procedure:  After informed consent was obtained, the patient was brought to the operating room and general LMA anesthesia was administered. The patient was then placed in the dorsolithotomy position and prepped and draped in the usual sterile fashion. A timeout was performed. A 23 French rigid cystoscope was then inserted into the urethral meatus and  advanced into the bladder under direct vision. A complete bladder survey revealed the findings listed above  Approximately 1 L of clot was evacuated from the bladder through the sheath of the cystoscope.  The large sessile bladder mass involving the right lateral wall of the bladder completely enveloped the right ureteral orifice, which was unidentifiable.  The left ureteral orifice was easily identifiable and effluxing urine.  Biopsy forceps were used to obtain a tissue specimen of the bladder mass.  Bugbee electrocautery was then used to extensively fulgurate the large sessile mass until hemostasis was achieved.  A 22 French three-way Foley catheter was then inserted with return of clear irrigant.  The catheter was extensively hand irrigated and was free of obstruction.  The catheter was then placed to continuous bladder irrigation.  The patient tolerated the procedure well and was transferred to the postanesthesia in stable condition.  Plan: Continue CBI.  Recommend palliative care consult

## 2020-08-14 NOTE — Progress Notes (Signed)
PT Cancellation Note  Patient Details Name: Woodfin Kiss MRN: [id number] DOB: 08/18/1932   Cancelled Treatment:     PT order received but eval deferred this am - pt with hgb of 6.2.  Will follow.   Christiona Siddique 08/14/2020, 8:52 AM

## 2020-08-14 NOTE — Progress Notes (Signed)
OT Cancellation Note  Patient Details Name: Steven Gutierrez MRN: [id number] DOB: July 03, 1933   Cancelled Treatment:    Reason Eval/Treat Not Completed: Medical issues which prohibited therapy Critically low HGB. Will f/u as able after transfusion.  Tazia Illescas L Shannara Winbush 08/14/2020, 9:34 AM

## 2020-08-14 NOTE — Anesthesia Preprocedure Evaluation (Signed)
Anesthesia Evaluation  Patient identified by MRN, date of birth, ID band Patient awake    Reviewed: Allergy & Precautions, NPO status , Patient's Chart, lab work & pertinent test results, reviewed documented beta blocker date and time   Airway Mallampati: II  TM Distance: >3 FB Neck ROM: Full    Dental  (+) Poor Dentition, Missing, Chipped, Dental Advisory Given,    Pulmonary former smoker,    Pulmonary exam normal breath sounds clear to auscultation       Cardiovascular hypertension, Pt. on medications and Pt. on home beta blockers Normal cardiovascular exam+ dysrhythmias (xarelto last dose: 1/15 AM) Atrial Fibrillation  Rhythm:Regular Rate:Normal     Neuro/Psych negative neurological ROS  negative psych ROS   GI/Hepatic Neg liver ROS, GERD  Medicated and Controlled,  Endo/Other  Hypothyroidism   Renal/GU negative Renal ROS   Metastatic prostate ca- most recent CTA A/P with widely metastatic prostate cancer,10 cm mass in the right pelvis that appears to be invading into the lateral aspects of the bladder along with a markedly distended bladder that appears to be filled with clot burden. P/w hematuria in the setting of blood thinner for Afib, foley continues to clot off    Musculoskeletal negative musculoskeletal ROS (+)   Abdominal Normal abdominal exam  (+)   Peds  Hematology  (+) Blood dyscrasia, anemia , H/H 12h ago 8.6/28   Anesthesia Other Findings Elderly, frail appearing   Reproductive/Obstetrics negative OB ROS                             Anesthesia Physical Anesthesia Plan  ASA: III and emergent  Anesthesia Plan: General   Post-op Pain Management:    Induction: Intravenous  PONV Risk Score and Plan: 2 and Ondansetron, Dexamethasone and Treatment may vary due to age or medical condition  Airway Management Planned: LMA  Additional Equipment: None  Intra-op Plan:    Post-operative Plan: Extubation in OR  Informed Consent: I have reviewed the patients History and Physical, chart, labs and discussed the procedure including the risks, benefits and alternatives for the proposed anesthesia with the patient or authorized representative who has indicated his/her understanding and acceptance.     Dental advisory given  Plan Discussed with: CRNA  Anesthesia Plan Comments:         Anesthesia Quick Evaluation

## 2020-08-14 NOTE — Consult Note (Signed)
Urology Consult   Physician requesting consult: Alona Bene, MD  Reason for consult: Gross hematuria  History of Present Illness: Steven Gutierrez is a 87 y.o. male with castration sensitive, metastatic prostate cancer who is currently being treated with abiraterone by Dr. Ellin Saba.  The patient presents to the emergency department after a 1 week history of progressive weakness and multiple falls from standing.  Patient also reports gross hematuria with worsening urinary urgency and incomplete bladder emptying.  Patient had a CT scan of the abdomen and pelvis that found widely metastatic prostate cancer with a near 10 cm mass in the right pelvis that appears to be invading into the lateral aspects of the bladder along with a markedly distended bladder that appears to be filled with clot burden.  Patient was also noted to have right renal atrophy with mild right-sided hydronephrosis that is likely secondary to distal obstruction from his pelvic mass.  The patient had a 47 French Foley catheter placed in the emergency department, but upon examination, this catheter was completely clotted off.  I placed a 22 French three-way Foley catheter and extensively hand irrigated his bladder with return of approximately 500 mL of well-formed clot.  He was started on CBI, but his 58 French catheter quickly clotted off despite the nursing staff's best efforts.  The patient is in a considerable amount of discomfort due to his distended bladder and is here for a clot evacuation.    Past Medical History:  Diagnosis Date  . A-fib (HCC)   . Cancer (HCC)    skin cancer  . Cancer [facility])    prostate  . Glaucoma 11/17/2019  . Gout 11/17/2019  . Hypertension   . Hypothyroidism 11/17/2019    Past Surgical History:  Procedure Laterality Date  . ESOPHAGOGASTRODUODENOSCOPY  03/2018  . TRANSURETHRAL RESECTION OF PROSTATE  04/21/2019    Current Hospital Medications:  Home Meds:  Current Meds  Medication Sig  .  abiraterone acetate (ZYTIGA) 250 MG tablet Take 3 tablets (750 mg total) by mouth daily. Take on an empty stomach 1 hour before or 2 hours after a meal  . acetaminophen (TYLENOL) 500 MG tablet Take 1,000 mg by mouth daily as needed for fever (for pain.).   Marland Kitchen allopurinol (ZYLOPRIM) 300 MG tablet Take 300 mg by mouth daily.  Marland Kitchen amLODipine (NORVASC) 5 MG tablet Take 1 tablet (5 mg total) by mouth daily.  . cholecalciferol (VITAMIN D3) 25 MCG (1000 UNIT) tablet Take 1,000 Units by mouth daily.  . ciprofloxacin (CIPRO) 250 MG tablet Take 250 mg by mouth 2 (two) times daily. For 10 days  . docusate sodium (COLACE) 100 MG capsule Take 100 mg by mouth daily as needed for mild constipation.  . gabapentin (NEURONTIN) 100 MG capsule Take 100 mg by mouth 3 (three) times daily.  Marland Kitchen lactose free nutrition (BOOST) LIQD Take 237 mLs by mouth 2 (two) times daily between meals. Indefinitely.  Marland Kitchen levothyroxine (SYNTHROID) 50 MCG tablet Take 50 mcg by mouth daily before breakfast.  . Multiple Vitamin (MULTIVITAMIN WITH MINERALS) TABS tablet Take 1 tablet by mouth daily.  . pantoprazole (PROTONIX) 40 MG tablet Take 40 mg by mouth daily.   . predniSONE (DELTASONE) 5 MG tablet Take 1 tablet (5 mg total) by mouth daily with breakfast.  . rivaroxaban (XARELTO) 20 MG TABS tablet Take 20 mg by mouth in the morning.   . timolol (BETIMOL) 0.5 % ophthalmic solution Place 1 drop into the left eye in the morning.  Marland Kitchen  traMADol (ULTRAM) 50 MG tablet Take 1-2 tablets (50-100 mg total) by mouth every 6 (six) hours as needed.    Scheduled Meds: . Chlorhexidine Gluconate Cloth  6 each Topical Daily  . feeding supplement  237 mL Oral BID BM  . pantoprazole (PROTONIX) IV  40 mg Intravenous Q12H   Continuous Infusions: PRN Meds:.morphine injection, prochlorperazine  Allergies:  Allergies  Allergen Reactions  . Oxycodone Nausea And Vomiting    Family History  Problem Relation Age of Onset  . Heart attack Mother   . Stroke  Father   . Heart attack Sister   . Lung cancer Brother     Social History:  reports that he has quit smoking. His smoking use included cigarettes. He smoked 0.50 packs per day. He has never used smokeless tobacco. He reports current alcohol use of about 1.0 standard drink of alcohol per week. He reports that he does not use drugs.  ROS: A complete review of systems was performed.  All systems are negative except for pertinent findings as noted.  Physical Exam:  Vital signs in last 24 hours: Temp:  [98.1 F (36.7 C)-99.6 F (37.6 C)] 98.6 F (37 C) (01/16 0141) Pulse Rate:  [80-100] 98 (01/16 0141) Resp:  [18-29] 18 (01/16 0141) BP: (100-148)/(60-78) 136/75 (01/16 0141) SpO2:  [91 %-100 %] 99 % (01/16 0141) Weight:  [81.6 kg] 81.6 kg (01/15 1230) Constitutional:  Alert and oriented, No acute distress Cardiovascular: Regular rate and rhythm, No JVD Respiratory: Normal respiratory effort, Lungs clear bilaterally GI: Abdomen is soft, nontender, nondistended, no abdominal masses GU: No CVA tenderness Lymphatic: No lymphadenopathy Neurologic: Grossly intact, no focal deficits Psychiatric: Normal mood and affect GU: 22 French three-way Foley catheter with a return of grossly bloody urine Laboratory Data:  Recent Labs    08/13/20 1438  WBC 10.5  HGB 8.6*  HCT 27.7*  PLT 366    Recent Labs    08/13/20 1438  NA 132*  K 3.8  CL 101  GLUCOSE 123*  BUN 19  CALCIUM 7.8*  CREATININE 1.31*     Results for orders placed or performed during the hospital encounter of 08/13/20 (from the past 24 hour(s))  SARS CORONAVIRUS 2 (TAT 6-24 HRS) Nasopharyngeal Nasopharyngeal Swab     Status: None   Collection Time: 08/13/20  1:23 PM   Specimen: Nasopharyngeal Swab  Result Value Ref Range   SARS Coronavirus 2 NEGATIVE NEGATIVE  Comprehensive metabolic panel     Status: Abnormal   Collection Time: 08/13/20  2:38 PM  Result Value Ref Range   Sodium 132 (L) 135 - 145 mmol/L   Potassium  3.8 3.5 - 5.1 mmol/L   Chloride 101 98 - 111 mmol/L   CO2 22 22 - 32 mmol/L   Glucose, Bld 123 (H) 70 - 99 mg/dL   BUN 19 8 - 23 mg/dL   Creatinine, Ser 1.31 (H) 0.61 - 1.24 mg/dL   Calcium 7.8 (L) 8.9 - 10.3 mg/dL   Total Protein 6.1 (L) 6.5 - 8.1 g/dL   Albumin 2.8 (L) 3.5 - 5.0 g/dL   AST 19 15 - 41 U/L   ALT 12 0 - 44 U/L   Alkaline Phosphatase 91 38 - 126 U/L   Total Bilirubin 0.9 0.3 - 1.2 mg/dL   GFR, Estimated 53 (L) >60 mL/min   Anion gap 9 5 - 15  CBC with Differential     Status: Abnormal   Collection Time: 08/13/20  2:38 PM  Result  Value Ref Range   WBC 10.5 4.0 - 10.5 K/uL   RBC 2.98 (L) 4.22 - 5.81 MIL/uL   Hemoglobin 8.6 (L) 13.0 - 17.0 g/dL   HCT 27.7 (L) 39.0 - 52.0 %   MCV 93.0 80.0 - 100.0 fL   MCH 28.9 26.0 - 34.0 pg   MCHC 31.0 30.0 - 36.0 g/dL   RDW 15.3 11.5 - 15.5 %   Platelets 366 150 - 400 K/uL   nRBC 0.0 0.0 - 0.2 %   Neutrophils Relative % 75 %   Neutro Abs 7.8 (H) 1.7 - 7.7 K/uL   Lymphocytes Relative 14 %   Lymphs Abs 1.4 0.7 - 4.0 K/uL   Monocytes Relative 9 %   Monocytes Absolute 0.9 0.1 - 1.0 K/uL   Eosinophils Relative 1 %   Eosinophils Absolute 0.2 0.0 - 0.5 K/uL   Basophils Relative 0 %   Basophils Absolute 0.0 0.0 - 0.1 K/uL   Immature Granulocytes 1 %   Abs Immature Granulocytes 0.15 (H) 0.00 - 0.07 K/uL  Resp Panel by RT-PCR (Flu A&B, Covid) Nasopharyngeal Swab     Status: None   Collection Time: 08/13/20  3:12 PM   Specimen: Nasopharyngeal Swab; Nasopharyngeal(NP) swabs in vial transport medium  Result Value Ref Range   SARS Coronavirus 2 by RT PCR NEGATIVE NEGATIVE   Influenza A by PCR NEGATIVE NEGATIVE   Influenza B by PCR NEGATIVE NEGATIVE  Vitamin B12     Status: None   Collection Time: 08/13/20  5:20 PM  Result Value Ref Range   Vitamin B-12 535 180 - 914 pg/mL  Folate     Status: None   Collection Time: 08/13/20  5:20 PM  Result Value Ref Range   Folate 19.1 >5.9 ng/mL  Iron and TIBC     Status: Abnormal    Collection Time: 08/13/20  5:20 PM  Result Value Ref Range   Iron 16 (L) 45 - 182 ug/dL   TIBC 211 (L) 250 - 450 ug/dL   Saturation Ratios 8 (L) 17.9 - 39.5 %   UIBC 195 ug/dL  Ferritin     Status: None   Collection Time: 08/13/20  5:20 PM  Result Value Ref Range   Ferritin 186 24 - 336 ng/mL  Reticulocytes     Status: Abnormal   Collection Time: 08/13/20  5:20 PM  Result Value Ref Range   Retic Ct Pct 3.2 (H) 0.4 - 3.1 %   RBC. 2.78 (L) 4.22 - 5.81 MIL/uL   Retic Count, Absolute 87.6 19.0 - 186.0 K/uL   Immature Retic Fract 37.9 (H) 2.3 - 15.9 %  POC occult blood, ED     Status: Abnormal   Collection Time: 08/13/20  6:11 PM  Result Value Ref Range   Fecal Occult Bld POSITIVE (A) NEGATIVE  Urinalysis, Routine w reflex microscopic Urine, Catheterized     Status: Abnormal   Collection Time: 08/13/20  7:52 PM  Result Value Ref Range   Color, Urine RED (A) YELLOW   APPearance HAZY (A) CLEAR   Specific Gravity, Urine  1.005 - 1.030    TEST NOT REPORTED DUE TO COLOR INTERFERENCE OF URINE PIGMENT   pH  5.0 - 8.0    TEST NOT REPORTED DUE TO COLOR INTERFERENCE OF URINE PIGMENT   Glucose, UA (A) NEGATIVE mg/dL    TEST NOT REPORTED DUE TO COLOR INTERFERENCE OF URINE PIGMENT   Hgb urine dipstick (A) NEGATIVE    TEST NOT REPORTED DUE  TO COLOR INTERFERENCE OF URINE PIGMENT   Bilirubin Urine (A) NEGATIVE    TEST NOT REPORTED DUE TO COLOR INTERFERENCE OF URINE PIGMENT   Ketones, ur (A) NEGATIVE mg/dL    TEST NOT REPORTED DUE TO COLOR INTERFERENCE OF URINE PIGMENT   Protein, ur (A) NEGATIVE mg/dL    TEST NOT REPORTED DUE TO COLOR INTERFERENCE OF URINE PIGMENT   Nitrite (A) NEGATIVE    TEST NOT REPORTED DUE TO COLOR INTERFERENCE OF URINE PIGMENT   Leukocytes,Ua (A) NEGATIVE    TEST NOT REPORTED DUE TO COLOR INTERFERENCE OF URINE PIGMENT   RBC / HPF >50 (H) 0 - 5 RBC/hpf   WBC, UA 11-20 0 - 5 WBC/hpf   Bacteria, UA NONE SEEN NONE SEEN   Recent Results (from the past 240 hour(s))  SARS  CORONAVIRUS 2 (TAT 6-24 HRS) Nasopharyngeal Nasopharyngeal Swab     Status: None   Collection Time: 08/13/20  1:23 PM   Specimen: Nasopharyngeal Swab  Result Value Ref Range Status   SARS Coronavirus 2 NEGATIVE NEGATIVE Final    Comment: (NOTE) SARS-CoV-2 target nucleic acids are NOT DETECTED.  The SARS-CoV-2 RNA is generally detectable in upper and lower respiratory specimens during the acute phase of infection. Negative results do not preclude SARS-CoV-2 infection, do not rule out co-infections with other pathogens, and should not be used as the sole basis for treatment or other patient management decisions. Negative results must be combined with clinical observations, patient history, and epidemiological information. The expected result is Negative.  Fact Sheet for Patients: HairSlick.no  Fact Sheet for Healthcare Providers: quierodirigir.com  This test is not yet approved or cleared by the [place] FDA and  has been authorized for detection and/or diagnosis of SARS-CoV-2 by FDA under an Emergency Use Authorization (EUA). This EUA will remain  in effect (meaning this test can be used) for the duration of the COVID-19 declaration under Se ction 564(b)(1) of the Act, 21 U.S.C. section 360bbb-3(b)(1), unless the authorization is terminated or revoked sooner.  Performed at [facility], [address], [place], [place] [address]   Resp Panel by RT-PCR (Flu A&B, Covid) Nasopharyngeal Swab     Status: None   Collection Time: 08/13/20  3:12 PM   Specimen: Nasopharyngeal Swab; Nasopharyngeal(NP) swabs in vial transport medium  Result Value Ref Range Status   SARS Coronavirus 2 by RT PCR NEGATIVE NEGATIVE Final    Comment: (NOTE) SARS-CoV-2 target nucleic acids are NOT DETECTED.  The SARS-CoV-2 RNA is generally detectable in upper respiratory specimens during the acute phase of infection. The lowest concentration of  SARS-CoV-2 viral copies this assay can detect is 138 copies/mL. A negative result does not preclude SARS-Cov-2 infection and should not be used as the sole basis for treatment or other patient management decisions. A negative result may occur with  improper specimen collection/handling, submission of specimen other than nasopharyngeal swab, presence of viral mutation(s) within the areas targeted by this assay, and inadequate number of viral copies(<138 copies/mL). A negative result must be combined with clinical observations, patient history, and epidemiological information. The expected result is Negative.  Fact Sheet for Patients:  BloggerCourse.com  Fact Sheet for Healthcare Providers:  SeriousBroker.it  This test is no t yet approved or cleared by the [place] FDA and  has been authorized for detection and/or diagnosis of SARS-CoV-2 by FDA under an Emergency Use Authorization (EUA). This EUA will remain  in effect (meaning this test can be used) for the  duration of the COVID-19 declaration under Section 564(b)(1) of the Act, 21 U.S.C.section 360bbb-3(b)(1), unless the authorization is terminated  or revoked sooner.       Influenza A by PCR NEGATIVE NEGATIVE Final   Influenza B by PCR NEGATIVE NEGATIVE Final    Comment: (NOTE) The Xpert Xpress SARS-CoV-2/FLU/RSV plus assay is intended as an aid in the diagnosis of influenza from Nasopharyngeal swab specimens and should not be used as a sole basis for treatment. Nasal washings and aspirates are unacceptable for Xpert Xpress SARS-CoV-2/FLU/RSV testing.  Fact Sheet for Patients: EntrepreneurPulse.com.au  Fact Sheet for Healthcare Providers: IncredibleEmployment.be  This test is not yet approved or cleared by the [place] FDA and has been authorized for detection and/or diagnosis of SARS-CoV-2 by FDA under an Emergency Use  Authorization (EUA). This EUA will remain in effect (meaning this test can be used) for the duration of the COVID-19 declaration under Section 564(b)(1) of the Act, 21 U.S.C. section 360bbb-3(b)(1), unless the authorization is terminated or revoked.  Performed at [facility], [address], [place],  [address]     Renal Function: Recent Labs    08/13/20 1438  CREATININE 1.31*   Estimated Creatinine Clearance: 45.3 mL/min (A) (by C-G formula based on SCr of 1.31 mg/dL (H)).  Radiologic Imaging: DG Pelvis 1-2 Views  Result Date: 08/13/2020 CLINICAL DATA:  Status post fall x2 yesterday.  Syncopal episodes. EXAM: PELVIS - 1-2 VIEW COMPARISON:  None. FINDINGS: No acute bony or joint abnormality is identified. Evaluation of the right hip is limited as the patient is externally rotated. IMPRESSION: No acute finding. Evaluation of the right hip is limited by external rotation. Electronically Signed   By: Inge Rise M.D.   On: 08/13/2020 14:19   CT Head Wo Contrast  Result Date: 08/13/2020 CLINICAL DATA:  Dizziness. Syncopal episode. Fell twice yesterday. Hypotension. EXAM: CT HEAD WITHOUT CONTRAST TECHNIQUE: Contiguous axial images were obtained from the base of the skull through the vertex without intravenous contrast. COMPARISON:  None. FINDINGS: Brain: No evidence of acute infarction, hemorrhage, hydrocephalus, extra-axial collection or mass lesion/mass effect. There is mild ventricular sulcal enlargement reflecting age-appropriate volume loss. Patchy areas of white matter hypoattenuation are also noted consistent with mild chronic microvascular ischemic change. Vascular: No hyperdense vessel or unexpected calcification. Skull: Normal. Negative for fracture or focal lesion. Sinuses/Orbits: Globes and orbits are unremarkable. Sinuses are clear. Other: None. IMPRESSION: 1. No acute intracranial abnormalities. 2. Age-appropriate volume loss and mild chronic microvascular ischemic  change. Electronically Signed   By: Lajean Manes M.D.   On: 08/13/2020 16:08   CT Renal Stone Study  Result Date: 08/13/2020 CLINICAL DATA:  Hematuria. Syncopal episode yesterday with falls. Urinary blood clots. Being treated for prostate cancer. Right hip pain and hematuria. EXAM: CT ABDOMEN AND PELVIS WITHOUT CONTRAST TECHNIQUE: Multidetector CT imaging of the abdomen and pelvis was performed following the standard protocol without IV contrast. COMPARISON:  11/17/2019 FINDINGS: Lower chest: Numerous new nodules within the lung bases right worse than left with the largest measuring 1.4 cm over the medial right lower lobe. Small left pleural effusion with minimal associated atelectasis. Calcified plaque over the right coronary artery and descending thoracic aorta. Hepatobiliary: Liver is somewhat small but unchanged without focal mass. Gallbladder and biliary tree are normal. Pancreas: Normal. Spleen: Normal. Adrenals/Urinary Tract: Adrenal glands are normal. Kidneys are normal in size with minimal stable prominence of the left intrarenal collecting system. No renal stones are present. Exophytic cyst over the upper  pole right kidney unchanged. Mild right-sided hydronephrosis with dilatation of the right renal pelvis and mild dilatation of the right ureter due to progressive enlargement of soft tissue mass over the right pelvic sidewall causing destruction of the adjacent right posterior acetabulum. This is compatible with known metastatic disease. Left ureter is normal. There is moderate hyperdense material filling just over half the bladder likely hemorrhagic material. Stomach/Bowel: Stomach and small bowel are normal. Appendix is not visualized. Mild diverticulosis of the colon. Vascular/Lymphatic: Calcified plaque over the abdominal aorta which is normal in caliber. Significant improvement in the previously seen bulky periaortic and iliac chain adenopathy. Reproductive: Evidence of previous prostatectomy.  Other: Interval progression of large irregular mass over the right pelvic sidewall measuring approximately 7.2 x 9.2 cm. This mass causes destruction along the right posterior acetabulum unchanged. This mass abuts and appears to invade the adjacent bladder at the right UVJ it extends into the surgical bed of the prostate gland. The mass abuts the right anterolateral aspect of the rectum contacting 90 degrees of the circumference. This is compatible progression of patient's known metastatic disease. Musculoskeletal: Mild interval progression of diffuse osseous metastatic disease. IMPRESSION: 1. Evidence of patient's known metastatic prostate cancer with interval progression of patient's known large irregular mass over the right pelvic sidewall measuring approximately 7.2 x 9.2 cm causing destruction of the right posterior acetabulum. This mass abuts and appears to invade the adjacent bladder at the right UVJ with moderate hyperdense material filling half the bladder likely hemorrhagic debris. This mass also causes a degree of obstruction with mild right-sided hydroureteronephrosis. The mass abuts the right anterolateral aspect of the rectum. Numerous new pulmonary nodules within the lung bases right worse than left compatible with metastatic disease. Small left pleural effusion with minimal associated atelectasis. Interval progression of known osseous metastatic disease. 2. Stable right renal cyst. 3. Mild colonic diverticulosis. 4. Aortic atherosclerosis.  Atherosclerotic coronary artery disease. Aortic Atherosclerosis (ICD10-I70.0). Electronically Signed   By: Marin Olp M.D.   On: 08/13/2020 16:27   DG Femur Min 2 Views Right  Result Date: 08/13/2020 CLINICAL DATA:  Syncopal episode yesterday with fall. Right leg pain. EXAM: RIGHT FEMUR 2 VIEWS COMPARISON:  None. FINDINGS: There is no evidence of fracture or other focal bone lesions. Minimal degenerative change of the right hip. Soft tissues are  unremarkable. IMPRESSION: No acute findings. Electronically Signed   By: Marin Olp M.D.   On: 08/13/2020 14:19    I independently reviewed the above imaging studies.  Impression/Recommendation 87 year old male with metastatic prostate cancer resulting in gross hematuria with clot urinary retention and acute anemia.  -The risk, benefits and alternatives of cystoscopy with clot evacuation and fulguration was discussed in detail.  Risk include, but are not limited to, bleeding, urinary tract infection, bladder injury/perforation, the need for multiple surgeries, MI, CVA, DVT and the inherent risk of general anesthesia.  He voices understanding and wishes to proceed. -Continue to hold Xarelto -Recommend palliative care consultation  Ellison Hughs, MD [facility] 08/14/2020, 3:20 AM

## 2020-08-15 ENCOUNTER — Encounter (HOSPITAL_COMMUNITY): Payer: Self-pay | Admitting: Urology

## 2020-08-15 DIAGNOSIS — Z7189 Other specified counseling: Secondary | ICD-10-CM | POA: Diagnosis not present

## 2020-08-15 DIAGNOSIS — Z191 Hormone sensitive malignancy status: Secondary | ICD-10-CM | POA: Diagnosis not present

## 2020-08-15 DIAGNOSIS — R310 Gross hematuria: Secondary | ICD-10-CM | POA: Diagnosis not present

## 2020-08-15 DIAGNOSIS — C61 Malignant neoplasm of prostate: Secondary | ICD-10-CM | POA: Diagnosis not present

## 2020-08-15 LAB — TYPE AND SCREEN
ABO/RH(D): A NEG
Antibody Screen: NEGATIVE
Unit division: 0

## 2020-08-15 LAB — CBC
HCT: 22.6 % — ABNORMAL LOW (ref 39.0–52.0)
Hemoglobin: 7.2 g/dL — ABNORMAL LOW (ref 13.0–17.0)
MCH: 29.4 pg (ref 26.0–34.0)
MCHC: 31.9 g/dL (ref 30.0–36.0)
MCV: 92.2 fL (ref 80.0–100.0)
Platelets: 280 10*3/uL (ref 150–400)
RBC: 2.45 MIL/uL — ABNORMAL LOW (ref 4.22–5.81)
RDW: 15.7 % — ABNORMAL HIGH (ref 11.5–15.5)
WBC: 18.1 10*3/uL — ABNORMAL HIGH (ref 4.0–10.5)
nRBC: 0 % (ref 0.0–0.2)

## 2020-08-15 LAB — BASIC METABOLIC PANEL
Anion gap: 10 (ref 5–15)
BUN: 19 mg/dL (ref 8–23)
CO2: 20 mmol/L — ABNORMAL LOW (ref 22–32)
Calcium: 8 mg/dL — ABNORMAL LOW (ref 8.9–10.3)
Chloride: 106 mmol/L (ref 98–111)
Creatinine, Ser: 1.11 mg/dL (ref 0.61–1.24)
GFR, Estimated: >60 mL/min (ref >60)
Glucose, Bld: 125 mg/dL — ABNORMAL HIGH (ref 70–99)
Potassium: 4.1 mmol/L (ref 3.5–5.1)
Sodium: 136 mmol/L (ref 135–145)

## 2020-08-15 LAB — BPAM RBC
Blood Product Expiration Date: 202201292359
ISSUE DATE / TIME: 202201161212
Unit Type and Rh: 600

## 2020-08-15 LAB — URINE CULTURE: Culture: NO GROWTH

## 2020-08-15 MED ORDER — ALUM & MAG HYDROXIDE-SIMETH 200-200-20 MG/5ML PO SUSP
30.0000 mL | Freq: Four times a day (QID) | ORAL | Status: DC | PRN
  Administered 2020-08-15: 30 mL via ORAL
  Filled 2020-08-15: qty 30

## 2020-08-15 MED ORDER — FAMOTIDINE 20 MG PO TABS: 20.0000 mg | ORAL_TABLET | Freq: Two times a day (BID) | ORAL | Status: DC | PRN

## 2020-08-15 MED ORDER — TRAMADOL HCL 50 MG PO TABS
50.0000 mg | ORAL_TABLET | Freq: Four times a day (QID) | ORAL | Status: DC | PRN
  Administered 2020-08-15: 50 mg via ORAL
  Filled 2020-08-15: qty 1

## 2020-08-15 NOTE — Progress Notes (Signed)
PROGRESS NOTE    Steven Gutierrez  [id number] DOB: [birth year] DOA: 08/13/2020 PCP: [name], MD     Brief Narrative:  Steven Gutierrez is a 87 y.o. 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 Delton Coombes), atrial fibrillation, normocytic anemia who presents to the emergency department via EMS due to mechanical falls x2 at home yesterday.  Patient lives alone and complained of about 4 weeks of blood in urine with blood clots which is associated with increasing weakness.  He sustained a fall at home twice yesterday. Patient was seen by son this morning and activated EMS due to patient's worsening weakness with falls. On arrival of EMS team, he was noted to have increased dizziness on standing, BP was checked in supine position (141/68) and this was repeated on standing (66/43).  He was then taken to the ED for further evaluation.    In the emergency department, work-up revealed anemia, significant hematuria.  CT abdomen pelvis revealed interval progression of patient's known large irregular mass over the right pelvic sidewall measuring approximately 7.2 x 9.2 cm causing destruction of the right posterior acetabulum. This mass abuts and appears to invade the adjacent bladder at the right UVJ with moderate hyperdense material filling half the bladder likely hemorrhagic debris. This mass also causes a degree of obstruction with mild right-sided hydroureteronephrosis. The mass abuts the right anterolateral aspect of the rectum. Numerous new pulmonary nodules within the lung bases right worse than left compatible with metastatic disease. Small left pleural effusion with minimal associated atelectasis. Interval progression of known osseous metastatic disease.  Urology was consulted and patient transferred to [facility] for cystoscopy due to urinary clot retention.  New events last 24 hours /  Subjective: Patient without acute complaints this morning.  He denies any blood loss from his rectum or bowel movements prior to admission.  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 Lovena Neighbours 1/16 -FOBT was positive but patient did not have any complaints of GI bleeding prior to admission.  Suspect his anemia is more due to source of hematuria but we will continue to monitor hemoglobin as well as any signs of overt GI blood loss.  Per son, patient has extensive hemorrhoids and has some bleeding on and off at baseline.  Has history of peptic ulcer disease, in the last 3 years had endoscopy in [place], [place]. BUN only 21 today  -Continue to hold Xarelto -Urology following -Status post 1 unit packed red blood cell 1/16 -IV feraheme   Generalized weakness with falls at home -PT OT  Orthostatic hypotension -Likely secondary to blood loss anemia -Continue to monitor, repeat today   Metastatic prostate cancer -Follows with Dr. Delton Coombes -CT scan revealed progression of disease  -Has been on Abiraterone, Xgeva, Prednisone  -Palliative care consulted for goals of care conversation  Hyponatremia -Resolved  A. Fib -Hold Xarelto -Telemetry   AKI -Baseline Cr 1 -Resolved  HTN -Hold Norvasc in setting of orthostatic hypotension on admission  Hypothyroidism -Continue Synthroid    Leukocytosis -Without concern for infectious process.  Could be secondary to prednisone use, continue to monitor    DVT prophylaxis:  SCDs Start: 08/13/20 2211  Code Status: Full code Family Communication: No family at bedside; discussed with son over the phone 1/16 Disposition Plan:  Status is: Inpatient  Remains inpatient appropriate because:IV treatments appropriate due to intensity of illness or inability to take PO and Inpatient level of care appropriate due to severity of illness   Dispo: The patient is from: Home              Anticipated d/c is to: Home              Anticipated d/c date is: 1 day              Patient currently is not medically stable to d/c.  PT OT, palliative care medicine pending.  Continue to monitor CBC.   Consultants:   Urology  Palliative care medicine  Procedures:   Cystoscopy with clot evacuation with fulguration and bladder biopsy by Dr. Dwaine Deter  Antimicrobials:  Anti-infectives (From admission, onward)   Start     Dose/Rate Route Frequency Ordered Stop   08/14/20 0415  ceFAZolin (ANCEF) IVPB 2g/100 mL premix  Status:  Discontinued        2 g 200 mL/hr over 30 Minutes Intravenous  Once 08/14/20 0327 08/15/20 0720       Objective: Vitals:   08/14/20 1242 08/14/20 1540 08/14/20 2024 08/15/20 0415  BP: 115/62 (!) 120/52 (!) 107/44 108/60  Pulse: 79 74 80 96  Resp: 18 18 20 18   Temp: (!) 97.5 F (36.4 C) (!) 97.5 F (36.4 C) 97.8 F (36.6 C) 98 F (36.7 C)  TempSrc: Oral Oral  Oral  SpO2: 100% 94% 100% 97%  Weight:      Height:        Intake/Output Summary (Last 24 hours) at 08/15/2020 1217 Last data filed at 08/15/2020 0900 Gross per 24 hour  Intake 2439.64 ml  Output 5025 ml  Net -2585.36 ml   Filed Weights   08/13/20 1230  Weight: 81.6 kg    Examination: General exam: Appears calm and comfortable  Respiratory system: Clear to auscultation. Respiratory effort normal. Cardiovascular system: S1 & S2 heard,  irregular rhythm, rate 80s. No pedal edema. Gastrointestinal system: Abdomen is nondistended, soft and nontender. Normal bowel sounds heard. Central nervous system: Alert and oriented. Non focal exam. Speech clear  Extremities: Symmetric in appearance bilaterally  Skin: No rashes, lesions or ulcers on exposed skin  Psychiatry: Judgement and insight appear stable. Mood & affect appropriate.    Data Reviewed: I have personally reviewed following labs and imaging studies  CBC: Recent Labs  Lab 08/13/20 1438 08/14/20 0426 08/14/20 0619 08/14/20 1912 08/15/20 0505  WBC 10.5  --  13.8*  --  18.1*  NEUTROABS 7.8*  --   --   --   --   HGB 8.6* 7.1* 6.2* 7.7* 7.2*  HCT 27.7* 21.0* 20.2* 24.8* 22.6*  MCV 93.0  --  94.4  --  92.2  PLT 366  --  295  --  892   Basic Metabolic Panel: Recent Labs  Lab 08/13/20 1438 08/14/20 0426 08/14/20 0619 08/15/20 0505  NA 132* 137 134* 136  K 3.8 4.3 4.0 4.1  CL 101  --  104 106  CO2 22  --  21* 20*  GLUCOSE 123*  --  117* 125*  BUN 19  --  21 19  CREATININE 1.31*  --  1.55* 1.11  CALCIUM 7.8*  --  8.1* 8.0*  MG  --   --  2.1  --   PHOS  --   --  2.8  --    GFR: Estimated Creatinine Clearance: 53.4 mL/min (by C-G formula based on SCr of 1.11 mg/dL). Liver Function Tests: Recent Labs  Lab 08/13/20 1438 08/14/20 0619  AST 19 20  ALT 12 11  ALKPHOS 91 78  BILITOT 0.9 0.9  PROT 6.1* 5.7*  ALBUMIN 2.8* 3.3*   No results for input(s): LIPASE, AMYLASE in the last 168 hours. No results for input(s): AMMONIA in the last 168 hours. Coagulation Profile: Recent Labs  Lab 08/14/20 0619  INR 1.4*   Cardiac Enzymes: No results for input(s): CKTOTAL, CKMB, CKMBINDEX, TROPONINI in the last 168 hours. BNP (last 3 results) No results for input(s): PROBNP in the last 8760 hours. HbA1C: No results for input(s): HGBA1C in the last 72 hours. CBG: No results for input(s): GLUCAP in the last 168 hours. Lipid Profile: No results for input(s):  CHOL, HDL, LDLCALC, TRIG, CHOLHDL, LDLDIRECT in the last 72 hours. Thyroid Function Tests: No results for input(s): TSH, T4TOTAL, FREET4, T3FREE, THYROIDAB in the last 72 hours. Anemia Panel: Recent Labs    08/13/20 1720  VITAMINB12 535  FOLATE 19.1  FERRITIN 186  TIBC 211*  IRON 16*  RETICCTPCT 3.2*   Sepsis Labs: No results for input(s): PROCALCITON, LATICACIDVEN in the last 168 hours.  Recent Results (from the past 240 hour(s))  SARS CORONAVIRUS 2 (TAT 6-24 HRS) Nasopharyngeal Nasopharyngeal Swab     Status: None   Collection Time: 08/13/20  1:23 PM   Specimen: Nasopharyngeal Swab  Result Value Ref Range Status   SARS Coronavirus 2 NEGATIVE NEGATIVE Final    Comment: (NOTE) SARS-CoV-2 target nucleic acids are NOT DETECTED.  The SARS-CoV-2 RNA is generally detectable in upper and lower respiratory specimens during the acute phase of infection. Negative results do not preclude SARS-CoV-2 infection, do not rule out co-infections with other pathogens, and should not be used as the sole basis for treatment or other patient management decisions. Negative results must be combined with clinical observations, patient history, and epidemiological information. The expected result is Negative.  Fact Sheet for Patients: SugarRoll.be  Fact Sheet for Healthcare Providers: https://www.woods-mathews.com/  This test is not yet approved or cleared by the [place] FDA and  has been authorized for detection and/or diagnosis of SARS-CoV-2 by FDA under an Emergency Use Authorization (EUA). This EUA will remain  in effect (meaning this test can be used) for the duration of the COVID-19 declaration under Se ction 564(b)(1) of the Act, 21 U.S.C. section 360bbb-3(b)(1), unless the authorization is terminated or revoked sooner.  Performed at [facility], [place] [address] [place], [place] [address]   Resp Panel by RT-PCR (Flu A&B, Covid)  Nasopharyngeal Swab     Status: None   Collection Time: 08/13/20  3:12 PM   Specimen: Nasopharyngeal Swab; Nasopharyngeal(NP) swabs in vial transport medium  Result Value Ref Range Status   SARS Coronavirus 2 by RT PCR NEGATIVE NEGATIVE Final    Comment: (NOTE) SARS-CoV-2 target nucleic acids are NOT DETECTED.  The SARS-CoV-2 RNA is generally detectable in upper respiratory specimens during the acute phase of infection.  The lowest concentration of SARS-CoV-2 viral copies this assay can detect is 138 copies/mL. A negative result does not preclude SARS-Cov-2 infection and should not be used as the sole basis for treatment or other patient management decisions. A negative result may occur with  improper specimen collection/handling, submission of specimen other than nasopharyngeal swab, presence of viral mutation(s) within the areas targeted by this assay, and inadequate number of viral copies(<138 copies/mL). A negative result must be combined with clinical observations, patient history, and epidemiological information. The expected result is Negative.  Fact Sheet for Patients:  EntrepreneurPulse.com.au  Fact Sheet for Healthcare Providers:  IncredibleEmployment.be  This test is no t yet approved or cleared by the [place] FDA and  has been authorized for detection and/or diagnosis of SARS-CoV-2 by FDA under an Emergency Use Authorization (EUA). This EUA will remain  in effect (meaning this test can be used) for the duration of the COVID-19 declaration under Section 564(b)(1) of the Act, 21 U.S.C.section 360bbb-3(b)(1), unless the authorization is terminated  or revoked sooner.       Influenza A by PCR NEGATIVE NEGATIVE Final   Influenza B by PCR NEGATIVE NEGATIVE Final    Comment: (NOTE) The Xpert Xpress SARS-CoV-2/FLU/RSV plus assay is intended as an aid in the diagnosis of influenza from Nasopharyngeal swab specimens and should not be  used as a sole basis for treatment. Nasal washings and aspirates are unacceptable for Xpert Xpress SARS-CoV-2/FLU/RSV testing.  Fact Sheet for Patients: EntrepreneurPulse.com.au  Fact Sheet for Healthcare Providers: IncredibleEmployment.be  This test is not yet approved or cleared by the [place] FDA and has been authorized for detection and/or diagnosis of SARS-CoV-2 by FDA under an Emergency Use Authorization (EUA). This EUA will remain in effect (meaning this test can be used) for the duration of the COVID-19 declaration under Section 564(b)(1) of the Act, 21 U.S.C. section 360bbb-3(b)(1), unless the authorization is terminated or revoked.  Performed at [facility], [address] [place], [place] [address]   Urine culture     Status: None   Collection Time: 08/13/20  7:52 PM   Specimen: Urine, Clean Catch  Result Value Ref Range Status   Specimen Description   Final    URINE, CLEAN CATCH Performed at [facility], [address] [place], [place] [address]    Special Requests   Final    NONE Performed at [facility], [address] [place], [place] [address]    Culture   Final    NO GROWTH Performed at [facility], [place] [address], [place], [place] [address]    Report Status 08/15/2020 FINAL  Final      Radiology Studies: DG Pelvis 1-2 Views  Result Date: 08/13/2020 CLINICAL DATA:  Status post fall x2 yesterday.  Syncopal episodes. EXAM: PELVIS - 1-2 VIEW COMPARISON:  None. FINDINGS: No acute bony or joint abnormality is identified. Evaluation of the right hip is limited as the patient is externally rotated. IMPRESSION: No acute finding. Evaluation of the right hip is limited by external rotation. Electronically Signed   By: Inge Rise M.D.   On: 08/13/2020 14:19   CT Head Wo Contrast  Result Date: 08/13/2020 CLINICAL DATA:  Dizziness. Syncopal episode. Fell twice yesterday. Hypotension. EXAM: CT HEAD WITHOUT  CONTRAST TECHNIQUE: Contiguous axial images were obtained from the base of the skull through the vertex without intravenous contrast. COMPARISON:  None. FINDINGS: Brain: No evidence of acute infarction, hemorrhage, hydrocephalus, extra-axial collection or mass lesion/mass effect. There  is mild ventricular sulcal enlargement reflecting age-appropriate volume loss. Patchy areas of white matter hypoattenuation are also noted consistent with mild chronic microvascular ischemic change. Vascular: No hyperdense vessel or unexpected calcification. Skull: Normal. Negative for fracture or focal lesion. Sinuses/Orbits: Globes and orbits are unremarkable. Sinuses are clear. Other: None. IMPRESSION: 1. No acute intracranial abnormalities. 2. Age-appropriate volume loss and mild chronic microvascular ischemic change. Electronically Signed   By: Lajean Manes M.D.   On: 08/13/2020 16:08   CT Renal Stone Study  Result Date: 08/13/2020 CLINICAL DATA:  Hematuria. Syncopal episode yesterday with falls. Urinary blood clots. Being treated for prostate cancer. Right hip pain and hematuria. EXAM: CT ABDOMEN AND PELVIS WITHOUT CONTRAST TECHNIQUE: Multidetector CT imaging of the abdomen and pelvis was performed following the standard protocol without IV contrast. COMPARISON:  11/17/2019 FINDINGS: Lower chest: Numerous new nodules within the lung bases right worse than left with the largest measuring 1.4 cm over the medial right lower lobe. Small left pleural effusion with minimal associated atelectasis. Calcified plaque over the right coronary artery and descending thoracic aorta. Hepatobiliary: Liver is somewhat small but unchanged without focal mass. Gallbladder and biliary tree are normal. Pancreas: Normal. Spleen: Normal. Adrenals/Urinary Tract: Adrenal glands are normal. Kidneys are normal in size with minimal stable prominence of the left intrarenal collecting system. No renal stones are present. Exophytic cyst over the upper pole  right kidney unchanged. Mild right-sided hydronephrosis with dilatation of the right renal pelvis and mild dilatation of the right ureter due to progressive enlargement of soft tissue mass over the right pelvic sidewall causing destruction of the adjacent right posterior acetabulum. This is compatible with known metastatic disease. Left ureter is normal. There is moderate hyperdense material filling just over half the bladder likely hemorrhagic material. Stomach/Bowel: Stomach and small bowel are normal. Appendix is not visualized. Mild diverticulosis of the colon. Vascular/Lymphatic: Calcified plaque over the abdominal aorta which is normal in caliber. Significant improvement in the previously seen bulky periaortic and iliac chain adenopathy. Reproductive: Evidence of previous prostatectomy. Other: Interval progression of large irregular mass over the right pelvic sidewall measuring approximately 7.2 x 9.2 cm. This mass causes destruction along the right posterior acetabulum unchanged. This mass abuts and appears to invade the adjacent bladder at the right UVJ it extends into the surgical bed of the prostate gland. The mass abuts the right anterolateral aspect of the rectum contacting 90 degrees of the circumference. This is compatible progression of patient's known metastatic disease. Musculoskeletal: Mild interval progression of diffuse osseous metastatic disease. IMPRESSION: 1. Evidence of patient's known metastatic prostate cancer with interval progression of patient's known large irregular mass over the right pelvic sidewall measuring approximately 7.2 x 9.2 cm causing destruction of the right posterior acetabulum. This mass abuts and appears to invade the adjacent bladder at the right UVJ with moderate hyperdense material filling half the bladder likely hemorrhagic debris. This mass also causes a degree of obstruction with mild right-sided hydroureteronephrosis. The mass abuts the right anterolateral aspect  of the rectum. Numerous new pulmonary nodules within the lung bases right worse than left compatible with metastatic disease. Small left pleural effusion with minimal associated atelectasis. Interval progression of known osseous metastatic disease. 2. Stable right renal cyst. 3. Mild colonic diverticulosis. 4. Aortic atherosclerosis.  Atherosclerotic coronary artery disease. Aortic Atherosclerosis (ICD10-I70.0). Electronically Signed   By: Marin Olp M.D.   On: 08/13/2020 16:27   DG Femur Min 2 Views Right  Result Date: 08/13/2020 CLINICAL DATA:  Syncopal episode yesterday with fall. Right leg pain. EXAM: RIGHT FEMUR 2 VIEWS COMPARISON:  None. FINDINGS: There is no evidence of fracture or other focal bone lesions. Minimal degenerative change of the right hip. Soft tissues are unremarkable. IMPRESSION: No acute findings. Electronically Signed   By: Marin Olp M.D.   On: 08/13/2020 14:19      Scheduled Meds: . Chlorhexidine Gluconate Cloth  6 each Topical Daily  . feeding supplement  237 mL Oral BID BM  . levothyroxine  50 mcg Oral QAC breakfast  . pantoprazole (PROTONIX) IV  40 mg Intravenous Q12H  . predniSONE  5 mg Oral Q breakfast  . timolol  1 drop Left Eye q morning - 10a   Continuous Infusions: . sodium chloride irrigation       LOS: 2 days      Time spent: 25 minutes   Dessa Phi, DO [facility] 08/15/2020, 12:17 PM   Available via Epic secure chat 7am-7pm After these hours, please refer to coverage provider listed on [email or web address]

## 2020-08-15 NOTE — Consult Note (Signed)
Consultation Note Date: 08/15/2020   Patient Name: Steven Gutierrez  DOB: [birth year]  MRN: [id number]  Age / Sex: 87 y.o., male  PCP: [name], MD Referring Physician: Dessa Phi, DO  Reason for Consultation: Establishing goals of care  HPI/Patient Profile: 87 y.o. 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 Workup revealed anemia with hgb 6.2 (now up to 7.2 s/p transfusion) as well as progression of large mass in pelvic sidewall causing destruction of the acetabulum, invading the bladder, and obstructing the R ureter; new pulmonary nodules and progression of osseous metastatic disease. He underwent cystoscopy for blood clot evaluation with fulgration and bladder biopsy- pathology is pending. Urine remains clear thus far. Palliative medicine consulted for goals of care.   Clinical Assessment and Goals of Care:  I have reviewed medical records including EPIC notes, labs and imaging, received report from patient's nurse, examined the patient and met at bedside with patient to discuss diagnosis prognosis, GOC, EOL wishes, disposition and options.  I introduced Palliative Medicine as specialized medical care for people living with serious illness. It focuses on providing relief from the symptoms and stress of a serious illness.   Evaluated patient-he was awake alert and oriented.  Sitting up in recliner.  He had just finished his breakfast.  He reports feeling better, with reduced pain.  We discussed a brief life review of the patient.  He lives in his home alone.  His spouse died about 3 years ago.  He has a son who is an EMT with advanced medical training and lives close by.  As far as  functional and nutritional status-prior to this admission he had been living at home independently, however he recognizes that he had been getting weaker and weaker.  He expressed that after this most recent fall he told his family that they needed to start making arrangements for more care for him.  We discussed her current illness and what it means in the larger context of her on-going co-morbidities.  Natural disease trajectory and expectations at EOL were discussed.  [name] shares the fear and worry that he experienced when he was told by the urologist that he could not be cured and the best thing that the urologist could do for him was "put him at ease ".   [name] felt that before making any kind of decision to that end he needs to discuss his situation with his oncologist Dr. Delton Coombes.  We discussed [name] current goals of care.  He expresses that right now his main concern is being able to discuss the progression of his cancer with Dr. Delton Coombes and what possible treatment plans might be offered.  Advanced directives, concepts specific to code status, artifical feeding and hydration, and rehospitalization were considered and discussed.  Mr. Schwertner does express that he would not want to be put  on life support.  He feels that would prolong his family's grief whenever his death was inevitable.  We discussed a DO NOT RESUSCITATE order, and [name] wishes to discuss that with his son before making a final decision.  With [name] is permission I called and spoke to his son Delman Goshorn.  [name] has concerns about discharge plans for patient.  We discussed that hospice services at home would be an option for Mr. Lakeman if he were to decide he no longer wanted to pursue aggressive treatment for his cancer.  However that decision will be deferred until they can have a discussion with Dr. Delton Coombes.  Questions and concerns were addressed.  The family was encouraged to call with questions or  concerns.   Primary Decision Maker PATIENT    SUMMARY OF RECOMMENDATIONS - Continue current treatment plan- patient stated he would likely accept any further treatment for his cancer if it was offered by Dr. Delton Coombes -Recommend continued advanced care planning with Dr. Delton Coombes- discussed possible inpatient oncology consultation with Dr. Maylene Roes- agree with her that outpatient followup with Dr. Delton Coombes is indicated  Code Status/Advance Care Planning:  Full code  Prognosis:    Unable to determine  Discharge Planning: Home with Home Health  Primary Diagnoses: Present on Admission: . Hematuria . Unspecified atrial fibrillation ([place]) . Metastatic castration-sensitive adenocarcinoma of prostate ([place])   I have reviewed the medical record, interviewed the patient and family, and examined the patient. The following aspects are pertinent.  Past Medical History:  Diagnosis Date  . A-fib ([place])   . Cancer ([place])    skin cancer  . Cancer [facility])    prostate  . Glaucoma 11/17/2019  . Gout 11/17/2019  . Hypertension   . Hypothyroidism 11/17/2019   Social History   Socioeconomic History  . Marital status: Widowed    Spouse name: Not on file  . Number of children: 2  . Years of education: Not on file  . Highest education level: Not on file  Occupational History  . Occupation: Retired  Tobacco Use  . Smoking status: Former Smoker    Packs/day: 0.50    Types: Cigarettes  . Smokeless tobacco: Never Used  Vaping Use  . Vaping Use: Never used  Substance and Sexual Activity  . Alcohol use: Yes    Alcohol/week: 1.0 standard drink    Types: 1 Glasses of wine per week    Comment: occ  . Drug use: Never  . Sexual activity: Not Currently  Other Topics Concern  . Not on file  Social History Narrative  . Not on file   Social Determinants of Health   Financial Resource Strain: Low Risk   . Difficulty of Paying Living Expenses: Not hard at all  Food Insecurity: No Food  Insecurity  . Worried About Charity fundraiser in the Last Year: Never true  . Ran Out of Food in the Last Year: Never true  Transportation Needs: No Transportation Needs  . Lack of Transportation (Medical): No  . Lack of Transportation (Non-Medical): No  Physical Activity: Insufficiently Active  . Days of Exercise per Week: 3 days  . Minutes of Exercise per Session: 30 min  Stress: No Stress Concern Present  . Feeling of Stress : Not at all  Social Connections: Moderately Isolated  . Frequency of Communication with Friends and Family: More than three times a week  . Frequency of Social Gatherings with Friends and Family: More than three times a week  .  Attends Religious Services: More than 4 times per year  . Active Member of Clubs or Organizations: No  . Attends Archivist Meetings: Never  . Marital Status: Widowed   Scheduled Meds: . Chlorhexidine Gluconate Cloth  6 each Topical Daily  . feeding supplement  237 mL Oral BID BM  . levothyroxine  50 mcg Oral QAC breakfast  . pantoprazole (PROTONIX) IV  40 mg Intravenous Q12H  . predniSONE  5 mg Oral Q breakfast  . timolol  1 drop Left Eye q morning - 10a   Continuous Infusions: . sodium chloride irrigation     PRN Meds:.morphine injection, prochlorperazine Medications Prior to Admission:  Prior to Admission medications   Medication Sig Start Date End Date Taking? Authorizing Provider  abiraterone acetate (ZYTIGA) 250 MG tablet Take 3 tablets (750 mg total) by mouth daily. Take on an empty stomach 1 hour before or 2 hours after a meal 05/11/20  Yes Derek Jack, MD  acetaminophen (TYLENOL) 500 MG tablet Take 1,000 mg by mouth daily as needed for fever (for pain.).    Yes [provider]  allopurinol (ZYLOPRIM) 300 MG tablet Take 300 mg by mouth daily.   Yes [provider]  amLODipine (NORVASC) 5 MG tablet Take 1 tablet (5 mg total) by mouth daily. 11/18/19 03/01/20 Yes Shah, Pratik D, DO   cholecalciferol (VITAMIN D3) 25 MCG (1000 UNIT) tablet Take 1,000 Units by mouth daily.   Yes [provider]  ciprofloxacin (CIPRO) 250 MG tablet Take 250 mg by mouth 2 (two) times daily. For 10 days 08/11/20  Yes [provider]  docusate sodium (COLACE) 100 MG capsule Take 100 mg by mouth daily as needed for mild constipation.   Yes [provider]  gabapentin (NEURONTIN) 100 MG capsule Take 100 mg by mouth 3 (three) times daily. 08/04/20  Yes [provider]  lactose free nutrition (BOOST) LIQD Take 237 mLs by mouth 2 (two) times daily between meals. Indefinitely. 06/15/20  Yes Derek Jack, MD  levothyroxine (SYNTHROID) 50 MCG tablet Take 50 mcg by mouth daily before breakfast.   Yes [provider]  Multiple Vitamin (MULTIVITAMIN WITH MINERALS) TABS tablet Take 1 tablet by mouth daily.   Yes [provider]  pantoprazole (PROTONIX) 40 MG tablet Take 40 mg by mouth daily.    Yes [provider]  predniSONE (DELTASONE) 5 MG tablet Take 1 tablet (5 mg total) by mouth daily with breakfast. 04/29/20  Yes Derek Jack, MD  rivaroxaban (XARELTO) 20 MG TABS tablet Take 20 mg by mouth in the morning.    Yes [provider]  timolol (BETIMOL) 0.5 % ophthalmic solution Place 1 drop into the left eye in the morning.   Yes [provider]  traMADol (ULTRAM) 50 MG tablet Take 1-2 tablets (50-100 mg total) by mouth every 6 (six) hours as needed. 07/05/20  Yes Burns, Wandra Feinstein, NP  lidocaine (LIDODERM) 5 % Place 1 patch onto the skin daily. Remove & Discard patch within 12 hours or as directed by MD 10/21/19   Henderly, Britni A, PA-C  ondansetron (ZOFRAN ODT) 4 MG disintegrating tablet Take 1 tablet (4 mg total) by mouth every 8 (eight) hours as needed for nausea or vomiting. 10/21/19   Henderly, Britni A, PA-C   Allergies  Allergen Reactions  . Oxycodone Nausea And Vomiting   Review of Systems  Constitutional:  Positive for activity change. Negative for appetite change and unexpected weight change.  Respiratory: Negative for  shortness of breath.     Physical Exam Vitals and nursing note reviewed.  Constitutional:      Appearance: Normal appearance.  Cardiovascular:     Rate and Rhythm: Normal rate.  Pulmonary:     Effort: Pulmonary effort is normal.  Neurological:     Mental Status: He is alert and oriented to person, place, and time.  Psychiatric:        Mood and Affect: Mood normal.        Behavior: Behavior normal.        Thought Content: Thought content normal.        Judgment: Judgment normal.     Vital Signs: BP 108/60 (BP Location: Left Arm)   Pulse 96   Temp 98 F (36.7 C) (Oral)   Resp 18   Ht 6' (1.829 m)   Wt 81.6 kg   SpO2 97%   BMI 24.41 kg/m  Pain Scale: 0-10   Pain Score: 0-No pain   SpO2: SpO2: 97 % O2 Device:SpO2: 97 % O2 Flow Rate: .O2 Flow Rate (L/min): 2 L/min  IO: Intake/output summary:   Intake/Output Summary (Last 24 hours) at 08/15/2020 1343 Last data filed at 08/15/2020 0900 Gross per 24 hour  Intake 2439.64 ml  Output 5025 ml  Net -2585.36 ml    LBM:   Baseline Weight: Weight: 81.6 kg Most recent weight: Weight: 81.6 kg     Palliative Assessment/Data: PPS: 60%     Thank you for this consult. Palliative medicine will continue to follow and assist as needed.   Time In: 1153 Time Out:1343 Time Total: 113 minutes Greater than 50%  of this time was spent counseling and coordinating care related to the above assessment and plan.  Signed by: Mariana Kaufman, AGNP-C Palliative Medicine    Please contact Palliative Medicine Team phone at [phone] for questions and concerns.  For individual provider: See Shea Evans

## 2020-08-15 NOTE — Evaluation (Signed)
Occupational Therapy Evaluation Patient Details Name: Steven Gutierrez MRN: [id number] DOB: 1932/08/15 Today's Date: 08/15/2020    History of Present Illness 87 y.o. 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 Delton Coombes), atrial fibrillation, COVID,  normocytic anemia who presents to the emergency department via EMS due to mechanical falls x2 at home. In the ED, work-up revealed anemia, significant hematuria.  CT abdomen pelvis revealed interval progression of patient's known large irregular mass over the right pelvic sidewall.    Clinical Impression   PTA, pt was living alone and was independent with ADLs and recently using a SPC and RW for mobility. Reports that his son and cleaning service assist with IADLs. Pt currently requiring Min Guard-Min A for LB ADLs and Min guard A for functional mobility with RW. Pt limited by fatigue and pain at RLE. Pt would benefit from further acute OT to facilitate safe dc. Discussed with pt his need for increased support at home due to recent falls; pt reports he and his family have started to plan for increased support. Recommend dc to home with HHOT for further OT to optimize safety, independence with ADLs, and return to PLOF.     Follow Up Recommendations  Home health OT;Supervision/Assistance - 24 hour (Discussed need for increased support at home)    Equipment Recommendations  3 in 1 bedside commode    Recommendations for Other Services PT consult     Precautions / Restrictions Precautions Precautions: Fall Precaution Comments: Increased pain at RLE; suspect from cancer medication Restrictions Weight Bearing Restrictions: No      Mobility Bed Mobility Overal bed mobility: Needs Assistance Bed Mobility: Supine to Sit;Rolling Rolling: Supervision   Supine to sit: Supervision     General bed mobility comments: Supervision for safety. Increased time  due to pain at RLE    Transfers Overall transfer level: Needs assistance Equipment used: Rolling walker (2 wheeled) Transfers: Sit to/from Stand Sit to Stand: Min guard;From elevated surface         General transfer comment: MIn Guard A for safety.    Balance Overall balance assessment: Needs assistance Sitting-balance support: No upper extremity supported;Feet supported Sitting balance-Leahy Scale: Good     Standing balance support: During functional activity;Bilateral upper extremity supported Standing balance-Leahy Scale: Poor Standing balance comment: Reliant on atleast one UE for support                           ADL either performed or assessed with clinical judgement   ADL Overall ADL's : Needs assistance/impaired Eating/Feeding: Set up;Sitting   Grooming: Set up;Supervision/safety;Sitting   Upper Body Bathing: Set up;Supervision/ safety;Sitting   Lower Body Bathing: Min guard;Minimal assistance;Sit to/from stand   Upper Body Dressing : Set up;Supervision/safety;Sitting   Lower Body Dressing: Minimal assistance;Sit to/from stand   Toilet Transfer: Min guard;Ambulation;RW (simulated to recliner)           Functional mobility during ADLs: Min guard;Rolling walker General ADL Comments: Pt presenting with decreased strength, balance, and activity tolerance due to pain at RLE.     Vision         Perception     Praxis      Pertinent Vitals/Pain Pain Assessment: 0-10 Pain Score: 4  Pain Location: R hip/groin Pain Descriptors / Indicators: Discomfort;Grimacing;Jabbing Pain Intervention(s): Monitored during session;Repositioned     Hand Dominance Right   Extremity/Trunk Assessment Upper Extremity Assessment Upper  Extremity Assessment: Overall WFL for tasks assessed   Lower Extremity Assessment Lower Extremity Assessment: Defer to PT evaluation   Cervical / Trunk Assessment Cervical / Trunk Assessment: Normal   Communication  Communication Communication: HOH   Cognition Arousal/Alertness: Awake/alert Behavior During Therapy: WFL for tasks assessed/performed Overall Cognitive Status: Within Functional Limits for tasks assessed                                 General Comments: Pt HOH so requiring certain information repeated   General Comments  SpO2 94%. BP stable: Supine 125/62 and sitting at EOB 119/63.    Exercises     Shoulder Instructions      Home Living Family/patient expects to be discharged to:: Private residence Living Arrangements: Alone Available Help at Discharge: Family;Friend(s);Available PRN/intermittently Type of Home: House Home Access: Stairs to enter [employer] of Steps: 3 Entrance Stairs-Rails: Can reach both Home Layout: Multi-level;Able to live on main level with bedroom/bathroom     Bathroom Shower/Tub: Teacher, early years/pre: Standard Bathroom Accessibility: Yes   Home Equipment: Grab bars - tub/shower;Cane - single point;Walker - 2 wheels;Shower seat;Bedside commode   Additional Comments: Son lives ~1 mile from patient.      Prior Functioning/Environment Level of Independence: Independent        Comments: enjoys walking; used to be very active, but now primarily walks in driveway. Has a dog        OT Problem List: Decreased strength;Decreased range of motion;Decreased activity tolerance;Impaired balance (sitting and/or standing);Decreased safety awareness;Decreased knowledge of use of DME or AE;Decreased knowledge of precautions;Pain      OT Treatment/Interventions: Self-care/ADL training;Therapeutic exercise;Energy conservation;DME and/or AE instruction;Therapeutic activities;Patient/family education    OT Goals(Current goals can be found in the care plan section) Acute Rehab OT Goals Patient Stated Goal: Return home OT Goal Formulation: With patient Time For Goal Achievement: 08/29/20 Potential to Achieve Goals: Good   OT Frequency: Min 2X/week   Barriers to D/C:            Co-evaluation              AM-PAC OT "6 Clicks" Daily Activity     Outcome Measure Help from another person eating meals?: None Help from another person taking care of personal grooming?: A Little Help from another person toileting, which includes using toliet, bedpan, or urinal?: A Little Help from another person bathing (including washing, rinsing, drying)?: A Little Help from another person to put on and taking off regular upper body clothing?: None Help from another person to put on and taking off regular lower body clothing?: A Little 6 Click Score: 20   End of Session Equipment Utilized During Treatment: Rolling walker Nurse Communication: Mobility status  Activity Tolerance: Patient tolerated treatment well Patient left: in chair;with call bell/phone within reach;with chair alarm set  OT Visit Diagnosis: Unsteadiness on feet (R26.81);Other abnormalities of gait and mobility (R26.89);Muscle weakness (generalized) (M62.81);Pain Pain - Right/Left: Right Pain - part of body: Leg                Time: 6967-8938 OT Time Calculation (min): 39 min Charges:  OT General Charges $OT Visit: 1 Visit OT Evaluation $OT Eval Moderate Complexity: 1 Mod OT Treatments $Self Care/Home Management : 23-37 mins  [place], OTR/L Acute Rehab Pager: [phone] Office: [place] 08/15/2020, 12:59 PM

## 2020-08-15 NOTE — Plan of Care (Signed)
  Problem: Activity: Goal: Risk for activity intolerance will decrease Outcome: Not Progressing   

## 2020-08-15 NOTE — Evaluation (Signed)
Physical Therapy Evaluation Patient Details Name: Steven Gutierrez MRN: [id number] DOB: [birth year] Today's Date: 08/15/2020   History of Present Illness  88 y.o. 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 Delton Coombes), atrial fibrillation, COVID,  normocytic anemia who presents to the emergency department via EMS due to mechanical falls x2 at home. In the ED, work-up revealed anemia, significant hematuria.  CT abdomen pelvis revealed interval progression of patient's known large irregular mass over the right pelvic sidewall.  Clinical Impression  On eval, pt was Steven guard assist for mobility. He walked ~75 feet with use of a RW. Moderate R hip pain with activity. Pt participated well. Will continue to follow and progress activity as tolerated. He is agreeable to HHPT f/u.     Follow Up Recommendations Home health PT    Equipment Recommendations  None recommended by PT    Recommendations for Other Services       Precautions / Restrictions Precautions Precautions: Fall Restrictions Weight Bearing Restrictions: No      Mobility  Bed Mobility               General bed mobility comments: oob in recliner    Transfers Overall transfer level: Needs assistance Equipment used: Rolling walker (2 wheeled) Transfers: Sit to/from Stand Sit to Stand: Steven guard         General transfer comment: Cues for hand placement. Increased time.  Ambulation/Gait Ambulation/Gait assistance: Steven guard Gait Distance (Feet): 75 Feet Assistive device: Rolling walker (2 wheeled) Gait Pattern/deviations: Step-through pattern;Decreased stride length     General Gait Details: Steven guard for safety. Slow gait speed. Cues for safety, technique. Slow start due to hip pain but improved as distance increased.  Stairs            Wheelchair Mobility    Modified Rankin (Stroke Patients Only)        Balance Overall balance assessment: Needs assistance         Standing balance support: Bilateral upper extremity supported Standing balance-Leahy Scale: Fair                               Pertinent Vitals/Pain Pain Assessment: 0-10 Pain Score: 5  Pain Location: R hip/groin Pain Descriptors / Indicators: Discomfort;Grimacing;Sharp Pain Intervention(s): Monitored during session    Home Living Family/patient expects to be discharged to:: Private residence Living Arrangements: Alone Available Help at Discharge: Family;Friend(s);Available PRN/intermittently Type of Home: House Home Access: Stairs to enter Entrance Stairs-Rails: Can reach both Entrance Stairs-Number of Steps: 3 Home Layout: Multi-level;Able to live on main level with bedroom/bathroom Home Equipment: Grab bars - tub/shower;Cane - single point;Walker - 2 wheels;Shower seat;Bedside commode Additional Comments: Son lives ~1 mile from patient.    Prior Function Level of Independence: Independent with assistive device(s)         Comments: using cane     Hand Dominance        Extremity/Trunk Assessment   Upper Extremity Assessment Upper Extremity Assessment: Defer to OT evaluation    Lower Extremity Assessment Lower Extremity Assessment: Generalized weakness;RLE deficits/detail RLE Deficits / Details: increased hip ER    Cervical / Trunk Assessment Cervical / Trunk Assessment: Normal  Communication   Communication: HOH  Cognition Arousal/Alertness: Awake/alert Behavior During Therapy: WFL for tasks assessed/performed Overall Cognitive Status: Within Functional Limits for tasks assessed  General Comments      Exercises     Assessment/Plan    PT Assessment Patient needs continued PT services  PT Problem List Decreased strength;Decreased mobility;Decreased activity tolerance;Decreased balance;Pain;Decreased knowledge of use  of DME       PT Treatment Interventions DME instruction;Gait training;Therapeutic activities;Therapeutic exercise;Patient/family education;Balance training;Functional mobility training    PT Goals (Current goals can be found in the Care Plan section)  Acute Rehab PT Goals Patient Stated Goal: Return home PT Goal Formulation: With patient Time For Goal Achievement: 08/29/20 Potential to Achieve Goals: Good    Frequency Steven 3X/week   Barriers to discharge        Co-evaluation               AM-PAC PT "6 Clicks" Mobility  Outcome Measure Help needed turning from your back to your side while in a flat bed without using bedrails?: A Little Help needed moving from lying on your back to sitting on the side of a flat bed without using bedrails?: A Little Help needed moving to and from a bed to a chair (including a wheelchair)?: A Little Help needed standing up from a chair using your arms (e.g., wheelchair or bedside chair)?: A Little Help needed to walk in hospital room?: A Little Help needed climbing 3-5 steps with a railing? : A Little 6 Click Score: 18    End of Session Equipment Utilized During Treatment: Gait belt Activity Tolerance: Patient tolerated treatment well Patient left: in chair;with call bell/phone within reach   PT Visit Diagnosis: Muscle weakness (generalized) (M62.81);Pain;Difficulty in walking, not elsewhere classified (R26.2) Pain - part of body: Hip    Time: 1520-1539 PT Time Calculation (Steven) (ACUTE ONLY): 19 Steven   Charges:   PT Evaluation $PT Eval Moderate Complexity: 1 Mod             Doreatha Massed, PT Acute Rehabilitation  Office: [phone] Pager: [phone]

## 2020-08-16 ENCOUNTER — Ambulatory Visit (HOSPITAL_COMMUNITY): Payer: Medicare Other

## 2020-08-16 ENCOUNTER — Inpatient Hospital Stay (HOSPITAL_COMMUNITY): Payer: Medicare Other

## 2020-08-16 ENCOUNTER — Ambulatory Visit (HOSPITAL_COMMUNITY): Payer: Medicare Other | Admitting: Hematology

## 2020-08-16 DIAGNOSIS — R310 Gross hematuria: Secondary | ICD-10-CM | POA: Diagnosis not present

## 2020-08-16 LAB — CBC
HCT: 23.2 % — ABNORMAL LOW (ref 39.0–52.0)
Hemoglobin: 7.3 g/dL — ABNORMAL LOW (ref 13.0–17.0)
MCH: 29.2 pg (ref 26.0–34.0)
MCHC: 31.5 g/dL (ref 30.0–36.0)
MCV: 92.8 fL (ref 80.0–100.0)
Platelets: 167 10*3/uL (ref 150–400)
RBC: 2.5 MIL/uL — ABNORMAL LOW (ref 4.22–5.81)
RDW: 16 % — ABNORMAL HIGH (ref 11.5–15.5)
WBC: 15.5 10*3/uL — ABNORMAL HIGH (ref 4.0–10.5)
nRBC: 0.5 % — ABNORMAL HIGH (ref 0.0–0.2)

## 2020-08-16 LAB — BASIC METABOLIC PANEL
Anion gap: 9 (ref 5–15)
BUN: 18 mg/dL (ref 8–23)
CO2: 24 mmol/L (ref 22–32)
Calcium: 8 mg/dL — ABNORMAL LOW (ref 8.9–10.3)
Chloride: 105 mmol/L (ref 98–111)
Creatinine, Ser: 1 mg/dL (ref 0.61–1.24)
GFR, Estimated: >60 mL/min (ref >60)
Glucose, Bld: 94 mg/dL (ref 70–99)
Potassium: 4 mmol/L (ref 3.5–5.1)
Sodium: 138 mmol/L (ref 135–145)

## 2020-08-16 LAB — SURGICAL PATHOLOGY

## 2020-08-16 NOTE — Discharge Summary (Addendum)
Physician Discharge Summary  Steven Gutierrez [id number] DOB: [birth year] DOA: 08/13/2020  PCP: [name], MD  Admit date: 08/13/2020 Discharge date: 08/16/2020  Admitted From: Home Disposition:  Home   Recommendations for Outpatient Follow-up:  1. Follow up with Dr. Delton Coombes in the next week  2. Follow up with Urology on 08/23/20 3. Recommended to discontinue Xarelto for now and continue to monitor hematuria and anemia.  Could consider resuming for stroke prevention as an outpatient.  Discharge Condition: Stable CODE STATUS: Full  Diet recommendation:  Diet Orders (From admission, onward)    Start     Ordered   08/13/20 2211  Diet Heart Room service appropriate? Yes; Fluid consistency: Thin  Diet effective now       Question Answer Comment  Room service appropriate? 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 Delton Coombes), atrial fibrillation, normocytic anemia who presents to the emergency departmentvia EMS due to mechanical falls x2 at home yesterday. Patient lives alone and complained of about 4 weeks of blood in urine with blood clots which is associated with increasing weakness. He sustained a fall at home twice yesterday. Patient was seen by son this morning and activated EMS due to patient's worsening weakness with falls. On arrival of EMS team, he was noted to have increased dizziness on standing, BP was checked in supine position (141/68) and this was repeated on standing (66/43). He was then taken to the ED for further evaluation.   In the emergency department, work-up revealed anemia, significant hematuria.  CT abdomen pelvis revealed interval progression of patient's known large irregular mass over the right pelvic sidewall measuring approximately 7.2 x 9.2 cm causing  destruction of the right posterior acetabulum. This mass abuts and appears to invade the adjacent bladder at the right UVJ with moderate hyperdense material filling half the bladder likely hemorrhagic debris. This mass also causes a degree of obstruction with mild right-sided hydroureteronephrosis. The mass abuts the right anterolateral aspect of the rectum. Numerous new pulmonary nodules within the lung bases right worse than left compatible with metastatic disease. Small left pleural effusion with minimal associated atelectasis. Interval progression of known osseous metastatic disease.  Urology was consulted and patient transferred to [facility] for cystoscopy due to urinary clot retention.  Patient underwent cystoscopy with clot evacuation with fulguration and bladder biopsy by Dr. Dwaine Deter.  Foley catheter was placed with continuous bladder irrigation.  Xarelto was held.  Patient did not have any further hematuria.  Foley catheter removed prior to discharge.  Patient was also given 1 unit packed red blood cell as well as IV Feraheme during the hospital stay.  He worked with physical therapy who recommended home health.  CT scan revealed progression of disease, palliative care medicine consulted for goals of care.  Patient and son looking to follow-up with Dr. Delton Coombes for his recommendation for treatment versus palliative measures.  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 Dwaine Deter -FOBT was positive but patient did not have any complaints of GI bleeding prior to admission.  Suspect his anemia is more due to source of hematuria but we will continue to monitor hemoglobin as well as any signs of overt GI blood loss.  Per son, patient has extensive hemorrhoids and has some bleeding on and off at baseline.  Has history of peptic ulcer disease, in the last 3 years had endoscopy in [place], [place].  -Continue to hold Xarelto -Status post 1 unit packed red blood cell 1/16 -Status post IV feraheme   Generalized weakness with falls at home -PT OT recommending home health therapy  Orthostatic hypotension -Likely secondary to blood loss anemia -Resolved, orthostatic vital signs negative on 1/17  Metastatic prostate cancer -Follows with Dr. Delton Coombes -CT scan revealed progression of disease  -Has been on Abiraterone, Xgeva, Prednisone  -Palliative care consulted for goals of care conversation -Family and patient desire to follow-up with Dr. Delton Coombes for further recommendation  Hyponatremia -Resolved  A. Fib -Hold Xarelto -Telemetry   AKI -Baseline Cr 1 -Resolved  HTN -Resume Norvasc  Hypothyroidism -Continue Synthroid   Leukocytosis -Without concern for infectious process.  Could be secondary to prednisone use, continue to monitor.  Leukocytosis improved overnight    Discharge Instructions  Discharge Instructions    Call MD for:  difficulty breathing, headache or visual disturbances   Complete by: As directed    Call MD for:  extreme fatigue   Complete by: As directed    Call MD for:  persistant dizziness or light-headedness   Complete by: As directed    Call MD for:  persistant nausea and vomiting   Complete by: As directed    Call MD for:  severe uncontrolled pain   Complete by: As directed    Call MD for:  temperature >100.4   Complete by: As directed     Discharge instructions   Complete by: As directed    You were cared for by a hospitalist during your hospital stay. If you have any questions about your discharge medications or the care you received while you were in the hospital after you are discharged, you can call the unit and ask to speak with the hospitalist on call if the hospitalist that took care of you is not available. Once you are discharged, your primary care physician will handle any further medical issues. Please note that NO REFILLS for any discharge medications will be authorized once you are discharged, as it is imperative that you return to your primary care physician (or establish a relationship with a primary care physician if you do not have one) for your aftercare needs so that they can reassess your need for medications and monitor your lab values.   Increase activity slowly   Complete by: As directed    No wound care   Complete by: As directed      Allergies as of 08/16/2020      Reactions   Oxycodone Nausea And Vomiting      Medication List    STOP taking these medications   ciprofloxacin 250 MG tablet Commonly known as: CIPRO   rivaroxaban 20 MG Tabs tablet Commonly known as: XARELTO     TAKE these medications   abiraterone acetate 250 MG tablet Commonly known  as: ZYTIGA Take 3 tablets (750 mg total) by mouth daily. Take on an empty stomach 1 hour before or 2 hours after a meal   acetaminophen 500 MG tablet Commonly known as: TYLENOL Take 1,000 mg by mouth daily as needed for fever (for pain.).   allopurinol 300 MG tablet Commonly known as: ZYLOPRIM Take 300 mg by mouth daily.   amLODipine 5 MG tablet Commonly known as: NORVASC Take 1 tablet (5 mg total) by mouth daily.   cholecalciferol 25 MCG (1000 UNIT) tablet Commonly known as: VITAMIN D3 Take 1,000 Units by mouth daily.   docusate sodium 100 MG capsule Commonly known as: COLACE Take 100 mg by mouth daily as needed for mild constipation.    gabapentin 100 MG capsule Commonly known as: NEURONTIN Take 100 mg by mouth 3 (three) times daily.   lactose free nutrition Liqd Take 237 mLs by mouth 2 (two) times daily between meals. Indefinitely.   levothyroxine 50 MCG tablet Commonly known as: SYNTHROID Take 50 mcg by mouth daily before breakfast.   lidocaine 5 % Commonly known as: Lidoderm Place 1 patch onto the skin daily. Remove & Discard patch within 12 hours or as directed by MD   multivitamin with minerals Tabs tablet Take 1 tablet by mouth daily.   ondansetron 4 MG disintegrating tablet Commonly known as: Zofran ODT Take 1 tablet (4 mg total) by mouth every 8 (eight) hours as needed for nausea or vomiting.   pantoprazole 40 MG tablet Commonly known as: PROTONIX Take 40 mg by mouth daily.   predniSONE 5 MG tablet Commonly known as: DELTASONE Take 1 tablet (5 mg total) by mouth daily with breakfast.   timolol 0.5 % ophthalmic solution Commonly known as: BETIMOL Place 1 drop into the left eye in the morning.   traMADol 50 MG tablet Commonly known as: ULTRAM Take 1-2 tablets (50-100 mg total) by mouth every 6 (six) hours as needed.       Follow-up Information    Derek Jack, MD. Call in 1 day(s).   Specialty: Hematology Contact information: [place] [address] [phone]              Allergies  Allergen Reactions  . Oxycodone Nausea And Vomiting    Consultations:  Urology  Palliative care medicine   Procedures/Studies: DG Pelvis 1-2 Views  Result Date: 08/13/2020 CLINICAL DATA:  Status post fall x2 yesterday.  Syncopal episodes. EXAM: PELVIS - 1-2 VIEW COMPARISON:  None. FINDINGS: No acute bony or joint abnormality is identified. Evaluation of the right hip is limited as the patient is externally rotated. IMPRESSION: No acute finding. Evaluation of the right hip is limited by external rotation. Electronically Signed   By: Inge Rise M.D.   On: 08/13/2020  14:19   CT Head Wo Contrast  Result Date: 08/13/2020 CLINICAL DATA:  Dizziness. Syncopal episode. Fell twice yesterday. Hypotension. EXAM: CT HEAD WITHOUT CONTRAST TECHNIQUE: Contiguous axial images were obtained from the base of the skull through the vertex without intravenous contrast. COMPARISON:  None. FINDINGS: Brain: No evidence of acute infarction, hemorrhage, hydrocephalus, extra-axial collection or mass lesion/mass effect. There is mild ventricular sulcal enlargement reflecting age-appropriate volume loss. Patchy areas of white matter hypoattenuation are also noted consistent with mild chronic microvascular ischemic change. Vascular: No hyperdense vessel or unexpected calcification. Skull: Normal. Negative for fracture or focal lesion. Sinuses/Orbits: Globes and orbits are unremarkable. Sinuses are clear. Other: None. IMPRESSION: 1. No acute intracranial abnormalities. 2. Age-appropriate volume  loss and mild chronic microvascular ischemic change. Electronically Signed   By: Lajean Manes M.D.   On: 08/13/2020 16:08   DG Chest Portable 1 View  Result Date: 07/31/2020 CLINICAL DATA:  Weakness EXAM: PORTABLE CHEST 1 VIEW COMPARISON:  11/17/2019, 09/04/2019, 09/01/2019, PET CT 11/10/2019 FINDINGS: Coarse mildly reticular diffuse pulmonary opacity likely due to chronic disease. No acute consolidation or effusion. Stable cardiomediastinal silhouette with borderline cardiomegaly and aortic atherosclerosis. 14 mm rounded opacity in the right upper lung and 14 mm nodular opacity in the left mid lung. IMPRESSION: 1. No acute pulmonary infiltrate or edema. 2. Probable underlying chronic interstitial lung disease. Possible bilateral lung nodules. Chest CT is recommended for further evaluation. Electronically Signed   By: Donavan Foil M.D.   On: 07/31/2020 16:39   CT Renal Stone Study  Result Date: 08/13/2020 CLINICAL DATA:  Hematuria. Syncopal episode yesterday with falls. Urinary blood clots. Being treated  for prostate cancer. Right hip pain and hematuria. EXAM: CT ABDOMEN AND PELVIS WITHOUT CONTRAST TECHNIQUE: Multidetector CT imaging of the abdomen and pelvis was performed following the standard protocol without IV contrast. COMPARISON:  11/17/2019 FINDINGS: Lower chest: Numerous new nodules within the lung bases right worse than left with the largest measuring 1.4 cm over the medial right lower lobe. Small left pleural effusion with minimal associated atelectasis. Calcified plaque over the right coronary artery and descending thoracic aorta. Hepatobiliary: Liver is somewhat small but unchanged without focal mass. Gallbladder and biliary tree are normal. Pancreas: Normal. Spleen: Normal. Adrenals/Urinary Tract: Adrenal glands are normal. Kidneys are normal in size with minimal stable prominence of the left intrarenal collecting system. No renal stones are present. Exophytic cyst over the upper pole right kidney unchanged. Mild right-sided hydronephrosis with dilatation of the right renal pelvis and mild dilatation of the right ureter due to progressive enlargement of soft tissue mass over the right pelvic sidewall causing destruction of the adjacent right posterior acetabulum. This is compatible with known metastatic disease. Left ureter is normal. There is moderate hyperdense material filling just over half the bladder likely hemorrhagic material. Stomach/Bowel: Stomach and small bowel are normal. Appendix is not visualized. Mild diverticulosis of the colon. Vascular/Lymphatic: Calcified plaque over the abdominal aorta which is normal in caliber. Significant improvement in the previously seen bulky periaortic and iliac chain adenopathy. Reproductive: Evidence of previous prostatectomy. Other: Interval progression of large irregular mass over the right pelvic sidewall measuring approximately 7.2 x 9.2 cm. This mass causes destruction along the right posterior acetabulum unchanged. This mass abuts and appears to  invade the adjacent bladder at the right UVJ it extends into the surgical bed of the prostate gland. The mass abuts the right anterolateral aspect of the rectum contacting 90 degrees of the circumference. This is compatible progression of patient's known metastatic disease. Musculoskeletal: Mild interval progression of diffuse osseous metastatic disease. IMPRESSION: 1. Evidence of patient's known metastatic prostate cancer with interval progression of patient's known large irregular mass over the right pelvic sidewall measuring approximately 7.2 x 9.2 cm causing destruction of the right posterior acetabulum. This mass abuts and appears to invade the adjacent bladder at the right UVJ with moderate hyperdense material filling half the bladder likely hemorrhagic debris. This mass also causes a degree of obstruction with mild right-sided hydroureteronephrosis. The mass abuts the right anterolateral aspect of the rectum. Numerous new pulmonary nodules within the lung bases right worse than left compatible with metastatic disease. Small left pleural effusion with minimal associated atelectasis. Interval progression of  known osseous metastatic disease. 2. Stable right renal cyst. 3. Mild colonic diverticulosis. 4. Aortic atherosclerosis.  Atherosclerotic coronary artery disease. Aortic Atherosclerosis (ICD10-I70.0). Electronically Signed   By: Marin Olp M.D.   On: 08/13/2020 16:27   DG Femur Min 2 Views Right  Result Date: 08/13/2020 CLINICAL DATA:  Syncopal episode yesterday with fall. Right leg pain. EXAM: RIGHT FEMUR 2 VIEWS COMPARISON:  None. FINDINGS: There is no evidence of fracture or other focal bone lesions. Minimal degenerative change of the right hip. Soft tissues are unremarkable. IMPRESSION: No acute findings. Electronically Signed   By: Marin Olp M.D.   On: 08/13/2020 14:19     Discharge Exam: Vitals:   08/15/20 2052 08/16/20 0526  BP: 123/65 119/70  Pulse: 84 80  Resp: 18 18  Temp: 98.3  F (36.8 C) 98.1 F (36.7 C)  SpO2: 94% 95%    General: Pt is alert, awake, not in acute distress Cardiovascular: Irregular rhythm, S1/S2 +, no edema Respiratory: CTA bilaterally, no wheezing, no rhonchi, no respiratory distress, no conversational dyspnea  Abdominal: Soft, NT, ND, bowel sounds + Extremities: no edema, no cyanosis Psych: Normal mood and affect, stable judgement and insight     The results of significant diagnostics from this hospitalization (including imaging, microbiology, ancillary and laboratory) are listed below for reference.     Microbiology: Recent Results (from the past 240 hour(s))  SARS CORONAVIRUS 2 (TAT 6-24 HRS) Nasopharyngeal Nasopharyngeal Swab     Status: None   Collection Time: 08/13/20  1:23 PM   Specimen: Nasopharyngeal Swab  Result Value Ref Range Status   SARS Coronavirus 2 NEGATIVE NEGATIVE Final    Comment: (NOTE) SARS-CoV-2 target nucleic acids are NOT DETECTED.  The SARS-CoV-2 RNA is generally detectable in upper and lower respiratory specimens during the acute phase of infection. Negative results do not preclude SARS-CoV-2 infection, do not rule out co-infections with other pathogens, and should not be used as the sole basis for treatment or other patient management decisions. Negative results must be combined with clinical observations, patient history, and epidemiological information. The expected result is Negative.  Fact Sheet for Patients: SugarRoll.be  Fact Sheet for Healthcare Providers: https://www.woods-mathews.com/  This test is not yet approved or cleared by the [place] FDA and  has been authorized for detection and/or diagnosis of SARS-CoV-2 by FDA under an Emergency Use Authorization (EUA). This EUA will remain  in effect (meaning this test can be used) for the duration of the COVID-19 declaration under Se ction 564(b)(1) of the Act, 21 U.S.C. section 360bbb-3(b)(1),  unless the authorization is terminated or revoked sooner.  Performed at [facility], [place] [address], [place], [place] [address]   Resp Panel by RT-PCR (Flu A&B, Covid) Nasopharyngeal Swab     Status: None   Collection Time: 08/13/20  3:12 PM   Specimen: Nasopharyngeal Swab; Nasopharyngeal(NP) swabs in vial transport medium  Result Value Ref Range Status   SARS Coronavirus 2 by RT PCR NEGATIVE NEGATIVE Final    Comment: (NOTE) SARS-CoV-2 target nucleic acids are NOT DETECTED.  The SARS-CoV-2 RNA is generally detectable in upper respiratory specimens during the acute phase of infection. The lowest concentration of SARS-CoV-2 viral copies this assay can detect is 138 copies/mL. A negative result does not preclude SARS-Cov-2 infection and should not be used as the sole basis for treatment or other patient management decisions. A negative result may occur with  improper specimen collection/handling, submission of specimen other than nasopharyngeal swab, presence  of viral mutation(s) within the areas targeted by this assay, and inadequate number of viral copies(<138 copies/mL). A negative result must be combined with clinical observations, patient history, and epidemiological information. The expected result is Negative.  Fact Sheet for Patients:  EntrepreneurPulse.com.au  Fact Sheet for Healthcare Providers:  IncredibleEmployment.be  This test is no t yet approved or cleared by the [place] FDA and  has been authorized for detection and/or diagnosis of SARS-CoV-2 by FDA under an Emergency Use Authorization (EUA). This EUA will remain  in effect (meaning this test can be used) for the duration of the COVID-19 declaration under Section 564(b)(1) of the Act, 21 U.S.C.section 360bbb-3(b)(1), unless the authorization is terminated  or revoked sooner.       Influenza A by PCR NEGATIVE NEGATIVE Final   Influenza B by PCR NEGATIVE NEGATIVE  Final    Comment: (NOTE) The Xpert Xpress SARS-CoV-2/FLU/RSV plus assay is intended as an aid in the diagnosis of influenza from Nasopharyngeal swab specimens and should not be used as a sole basis for treatment. Nasal washings and aspirates are unacceptable for Xpert Xpress SARS-CoV-2/FLU/RSV testing.  Fact Sheet for Patients: EntrepreneurPulse.com.au  Fact Sheet for Healthcare Providers: IncredibleEmployment.be  This test is not yet approved or cleared by the [place] FDA and has been authorized for detection and/or diagnosis of SARS-CoV-2 by FDA under an Emergency Use Authorization (EUA). This EUA will remain in effect (meaning this test can be used) for the duration of the COVID-19 declaration under Section 564(b)(1) of the Act, 21 U.S.C. section 360bbb-3(b)(1), unless the authorization is terminated or revoked.  Performed at [facility], [address], [place], [place] [address]   Urine culture     Status: None   Collection Time: 08/13/20  7:52 PM   Specimen: Urine, Clean Catch  Result Value Ref Range Status   Specimen Description   Final    URINE, CLEAN CATCH Performed at [facility], [address] [place], [place] [address]    Special Requests   Final    NONE Performed at [facility], [address], [place], [place] [address]    Culture   Final    NO GROWTH Performed at [facility], [place] [address], [place],  [address]    Report Status 08/15/2020 FINAL  Final     Labs: BNP (last 3 results) Recent Labs    09/03/19 0245  BNP [id number]*   Basic Metabolic Panel: Recent Labs  Lab 08/13/20 1438 08/14/20 0426 08/14/20 0619 08/15/20 0505 08/16/20 0521  NA 132* 137 134* 136 138  K 3.8 4.3 4.0 4.1 4.0  CL 101  --  104 106 105  CO2 22  --  21* 20* 24  GLUCOSE 123*  --  117* 125* 94  BUN 19  --  21 19 18   CREATININE 1.31*  --  1.55* 1.11 1.00  CALCIUM 7.8*  --  8.1* 8.0* 8.0*  MG  --   --  2.1  --   --    PHOS  --   --  2.8  --   --    Liver Function Tests: Recent Labs  Lab 08/13/20 1438 08/14/20 0619  AST 19 20  ALT 12 11  ALKPHOS 91 78  BILITOT 0.9 0.9  PROT 6.1* 5.7*  ALBUMIN 2.8* 3.3*   No results for input(s): LIPASE, AMYLASE in the last 168 hours. No results for input(s): AMMONIA in the last 168 hours. CBC: Recent Labs  Lab 08/13/20  1438 08/14/20 0426 08/14/20 0619 08/14/20 1912 08/15/20 0505 08/16/20 0521  WBC 10.5  --  13.8*  --  18.1* 15.5*  NEUTROABS 7.8*  --   --   --   --   --   HGB 8.6* 7.1* 6.2* 7.7* 7.2* 7.3*  HCT 27.7* 21.0* 20.2* 24.8* 22.6* 23.2*  MCV 93.0  --  94.4  --  92.2 92.8  PLT 366  --  295  --  280 167   Cardiac Enzymes: No results for input(s): CKTOTAL, CKMB, CKMBINDEX, TROPONINI in the last 168 hours. BNP: Invalid input(s): POCBNP CBG: No results for input(s): GLUCAP in the last 168 hours. D-Dimer No results for input(s): DDIMER in the last 72 hours. Hgb A1c No results for input(s): HGBA1C in the last 72 hours. Lipid Profile No results for input(s): CHOL, HDL, LDLCALC, TRIG, CHOLHDL, LDLDIRECT in the last 72 hours. Thyroid function studies No results for input(s): TSH, T4TOTAL, T3FREE, THYROIDAB in the last 72 hours.  Invalid input(s): FREET3 Anemia work up Recent Labs    08/13/20 1720  VITAMINB12 535  FOLATE 19.1  FERRITIN 186  TIBC 211*  IRON 16*  RETICCTPCT 3.2*   Urinalysis    Component Value Date/Time   COLORURINE RED (A) 08/13/2020 1952   APPEARANCEUR HAZY (A) 08/13/2020 1952   LABSPEC  08/13/2020 1952    TEST NOT REPORTED DUE TO COLOR INTERFERENCE OF URINE PIGMENT   PHURINE  08/13/2020 1952    TEST NOT REPORTED DUE TO COLOR INTERFERENCE OF URINE PIGMENT   GLUCOSEU (A) 08/13/2020 1952    TEST NOT REPORTED DUE TO COLOR INTERFERENCE OF URINE PIGMENT   HGBUR (A) 08/13/2020 1952    TEST NOT REPORTED DUE TO COLOR INTERFERENCE OF URINE PIGMENT   BILIRUBINUR (A) 08/13/2020 1952    TEST NOT REPORTED DUE TO COLOR  INTERFERENCE OF URINE PIGMENT   KETONESUR (A) 08/13/2020 1952    TEST NOT REPORTED DUE TO COLOR INTERFERENCE OF URINE PIGMENT   PROTEINUR (A) 08/13/2020 1952    TEST NOT REPORTED DUE TO COLOR INTERFERENCE OF URINE PIGMENT   NITRITE (A) 08/13/2020 1952    TEST NOT REPORTED DUE TO COLOR INTERFERENCE OF URINE PIGMENT   LEUKOCYTESUR (A) 08/13/2020 1952    TEST NOT REPORTED DUE TO COLOR INTERFERENCE OF URINE PIGMENT   Sepsis Labs Invalid input(s): PROCALCITONIN,  WBC,  LACTICIDVEN Microbiology Recent Results (from the past 240 hour(s))  SARS CORONAVIRUS 2 (TAT 6-24 HRS) Nasopharyngeal Nasopharyngeal Swab     Status: None   Collection Time: 08/13/20  1:23 PM   Specimen: Nasopharyngeal Swab  Result Value Ref Range Status   SARS Coronavirus 2 NEGATIVE NEGATIVE Final    Comment: (NOTE) SARS-CoV-2 target nucleic acids are NOT DETECTED.  The SARS-CoV-2 RNA is generally detectable in upper and lower respiratory specimens during the acute phase of infection. Negative results do not preclude SARS-CoV-2 infection, do not rule out co-infections with other pathogens, and should not be used as the sole basis for treatment or other patient management decisions. Negative results must be combined with clinical observations, patient history, and epidemiological information. The expected result is Negative.  Fact Sheet for Patients: SugarRoll.be  Fact Sheet for Healthcare Providers: https://www.woods-mathews.com/  This test is not yet approved or cleared by the [place] FDA and  has been authorized for detection and/or diagnosis of SARS-CoV-2 by FDA under an Emergency Use Authorization (EUA). This EUA will remain  in effect (meaning this test can be used) for the duration  of the COVID-19 declaration under Se ction 564(b)(1) of the Act, 21 U.S.C. section 360bbb-3(b)(1), unless the authorization is terminated or revoked sooner.  Performed at [facility], [place] [address], [place], [place] [address]   Resp Panel by RT-PCR (Flu A&B, Covid) Nasopharyngeal Swab     Status: None   Collection Time: 08/13/20  3:12 PM   Specimen: Nasopharyngeal Swab; Nasopharyngeal(NP) swabs in vial transport medium  Result Value Ref Range Status   SARS Coronavirus 2 by RT PCR NEGATIVE NEGATIVE Final    Comment: (NOTE) SARS-CoV-2 target nucleic acids are NOT DETECTED.  The SARS-CoV-2 RNA is generally detectable in upper respiratory specimens during the acute phase of infection. The lowest concentration of SARS-CoV-2 viral copies this assay can detect is 138 copies/mL. A negative result does not preclude SARS-Cov-2 infection and should not be used as the sole basis for treatment or other patient management decisions. A negative result may occur with  improper specimen collection/handling, submission of specimen other than nasopharyngeal swab, presence of viral mutation(s) within the areas targeted by this assay, and inadequate number of viral copies(<138 copies/mL). A negative result must be combined with clinical observations, patient history, and epidemiological information. The expected result is Negative.  Fact Sheet for Patients:  EntrepreneurPulse.com.au  Fact Sheet for Healthcare Providers:  IncredibleEmployment.be  This test is no t yet approved or cleared by the [place] FDA and  has been authorized for detection and/or diagnosis of SARS-CoV-2 by FDA under an Emergency Use Authorization (EUA). This EUA will remain  in effect (meaning this test can be used) for the duration of the COVID-19 declaration under Section 564(b)(1) of the Act, 21 U.S.C.section 360bbb-3(b)(1), unless the authorization is terminated  or revoked sooner.       Influenza A by PCR NEGATIVE NEGATIVE Final   Influenza B by PCR NEGATIVE NEGATIVE Final    Comment: (NOTE) The Xpert Xpress SARS-CoV-2/FLU/RSV plus assay is intended  as an aid in the diagnosis of influenza from Nasopharyngeal swab specimens and should not be used as a sole basis for treatment. Nasal washings and aspirates are unacceptable for Xpert Xpress SARS-CoV-2/FLU/RSV testing.  Fact Sheet for Patients: EntrepreneurPulse.com.au  Fact Sheet for Healthcare Providers: IncredibleEmployment.be  This test is not yet approved or cleared by the [place] FDA and has been authorized for detection and/or diagnosis of SARS-CoV-2 by FDA under an Emergency Use Authorization (EUA). This EUA will remain in effect (meaning this test can be used) for the duration of the COVID-19 declaration under Section 564(b)(1) of the Act, 21 U.S.C. section 360bbb-3(b)(1), unless the authorization is terminated or revoked.  Performed at [facility], [address], [place], [place] [address]   Urine culture     Status: None   Collection Time: 08/13/20  7:52 PM   Specimen: Urine, Clean Catch  Result Value Ref Range Status   Specimen Description   Final    URINE, CLEAN CATCH Performed at [facility], [address], [place], [place] [address]    Special Requests   Final    NONE Performed at [facility], [address], [place], [place] [address]    Culture   Final    NO GROWTH Performed at [facility], [place] [address], [place], [place] [address]    Report Status 08/15/2020 FINAL  Final     Patient was seen and examined on the day of discharge and was found to be in stable condition. Time coordinating discharge: 25 minutes  including assessment and coordination of care, as well as examination of the patient.   SIGNED:  Dessa Phi, DO [facility] 08/16/2020, 12:05 PM

## 2020-08-16 NOTE — TOC Transition Note (Signed)
Transition of Care [facility]) - CM/SW Discharge Note   Patient Details  Name: Steven Gutierrez MRN: [id number] Date of Birth: July 07, 1933  Transition of Care [facility]) CM/SW Contact:  Ross Ludwig, LCSW Phone Number: 08/16/2020, 6:31 PM   Clinical Narrative:     Patient will be going home with home health through [facility].  CSW signing off please reconsult with any other social work needs, home health agency has been notified of planned discharge.  Patient's sons will pick patient up and bring him home.  Patient needed a bedside commode, which will be delivered to patient's room before he leaves.   Final next level of care: [place] Barriers to Discharge: Barriers Resolved   Patient Goals and CMS Choice Patient states their goals for this hospitalization and ongoing recovery are:: To return back home with home health services. CMS Medicare.gov Compare Post Acute Care list provided to:: Patient Represenative (must comment) Choice offered to / list presented to : Adult Children  Discharge Placement                       Discharge Plan and Services                DME Arranged: Bedside commode DME Agency: Other - Comment [facility]) Date DME Agency Contacted: 08/16/20 Time DME Agency Contacted: 1400 Representative spoke with at DME Agency: Brenton Grills HH Arranged: PT,OT,Social Work [place]: [place] Date [place]: 08/16/20 Time [place]: 1500 Representative spoke with at [place]: [place] Determinants of Health ([place]) Interventions     Readmission Risk Interventions No flowsheet data found.

## 2020-08-16 NOTE — Progress Notes (Signed)
2 Days Post-Op Subjective: Catheter out and patient voiding without difficulty.  H&H stable.  Objective: Vital signs in last 24 hours: Temp:  [97.9 F (36.6 C)-98.3 F (36.8 C)] 98.1 F (36.7 C) (01/18 0526) Pulse Rate:  [80-84] 80 (01/18 0526) Resp:  [18-20] 18 (01/18 0526) BP: (119-127)/(65-70) 119/70 (01/18 0526) SpO2:  [94 %-96 %] 95 % (01/18 0526)  Intake/Output from previous day: 01/17 0701 - 01/18 0700 In: 1840 [P.O.:240] Out: 3225 [Urine:3225]  Intake/Output this shift: Total I/O In: -  Out: 700 [Urine:700]  Physical Exam:  General: Alert and oriented CV: RRR, palpable distal pulses Lungs: CTAB, equal chest rise Abdomen: Soft, NTND, no rebound or guarding Ext: NT, No erythema  Lab Results: Recent Labs    08/14/20 1912 08/15/20 0505 08/16/20 0521  HGB 7.7* 7.2* 7.3*  HCT 24.8* 22.6* 23.2*   BMET Recent Labs    08/15/20 0505 08/16/20 0521  NA 136 138  K 4.1 4.0  CL 106 105  CO2 20* 24  GLUCOSE 125* 94  BUN 19 18  CREATININE 1.11 1.00  CALCIUM 8.0* 8.0*     Studies/Results: No results found.  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  Status post cystoscopy with clot evacuation on 08/14/2020.  -The patient already had a scheduled follow-up with Dr. Alyson Ingles on 08/23/2020.  I encouraged him to keep that appointment -Xarelto is being held, per primary team   LOS: 3 days   Ellison Hughs, [facility] Pager: [phone]  08/16/2020, 1:46 PM

## 2020-08-16 NOTE — Plan of Care (Signed)
  Problem: Education: Goal: Knowledge of General Education information will improve Description: Including pain rating scale, medication(s)/side effects and non-pharmacologic comfort measures Outcome: Completed/Met   Problem: Activity: Goal: Risk for activity intolerance will decrease Outcome: Completed/Met   Problem: Pain Managment: Goal: General experience of comfort will improve Outcome: Completed/Met   Problem: Safety: Goal: Ability to remain free from injury will improve Outcome: Completed/Met

## 2020-08-16 NOTE — Progress Notes (Signed)
Reviewed dc instructions with pt. Pt verbalized understanding of all instructions. Tatijana Bierly, Laurel Dimmer, RN

## 2020-08-16 NOTE — Plan of Care (Signed)

## 2020-08-16 NOTE — Care Management Important Message (Signed)
Important Message  Patient Details IM Letter given to the Patient. Name: Trung Wenzl MRN: [id number] Date of Birth: Oct 08, 1932   Medicare Important Message Given:  Yes     Kerin Salen 08/16/2020, 3:43 PM

## 2020-08-19 ENCOUNTER — Other Ambulatory Visit (HOSPITAL_COMMUNITY): Payer: Self-pay

## 2020-08-19 DIAGNOSIS — C7951 Secondary malignant neoplasm of bone: Secondary | ICD-10-CM

## 2020-08-19 DIAGNOSIS — C61 Malignant neoplasm of prostate: Secondary | ICD-10-CM

## 2020-08-22 ENCOUNTER — Other Ambulatory Visit: Payer: Self-pay

## 2020-08-22 ENCOUNTER — Inpatient Hospital Stay (HOSPITAL_COMMUNITY): Payer: Medicare Other

## 2020-08-22 ENCOUNTER — Inpatient Hospital Stay (HOSPITAL_COMMUNITY): Payer: Medicare Other | Attending: Hematology

## 2020-08-22 ENCOUNTER — Inpatient Hospital Stay (HOSPITAL_BASED_OUTPATIENT_CLINIC_OR_DEPARTMENT_OTHER): Payer: Medicare Other | Admitting: Hematology

## 2020-08-22 VITALS — BP 87/59 | HR 94 | Temp 97.0°F | Resp 20

## 2020-08-22 DIAGNOSIS — I4891 Unspecified atrial fibrillation: Secondary | ICD-10-CM | POA: Insufficient documentation

## 2020-08-22 DIAGNOSIS — Z9079 Acquired absence of other genital organ(s): Secondary | ICD-10-CM | POA: Diagnosis not present

## 2020-08-22 DIAGNOSIS — Z191 Hormone sensitive malignancy status: Secondary | ICD-10-CM | POA: Insufficient documentation

## 2020-08-22 DIAGNOSIS — C61 Malignant neoplasm of prostate: Secondary | ICD-10-CM

## 2020-08-22 DIAGNOSIS — Z79899 Other long term (current) drug therapy: Secondary | ICD-10-CM | POA: Diagnosis not present

## 2020-08-22 DIAGNOSIS — Z801 Family history of malignant neoplasm of trachea, bronchus and lung: Secondary | ICD-10-CM | POA: Insufficient documentation

## 2020-08-22 DIAGNOSIS — Z8249 Family history of ischemic heart disease and other diseases of the circulatory system: Secondary | ICD-10-CM | POA: Diagnosis not present

## 2020-08-22 DIAGNOSIS — I2510 Atherosclerotic heart disease of native coronary artery without angina pectoris: Secondary | ICD-10-CM | POA: Insufficient documentation

## 2020-08-22 DIAGNOSIS — Z7952 Long term (current) use of systemic steroids: Secondary | ICD-10-CM | POA: Insufficient documentation

## 2020-08-22 DIAGNOSIS — I10 Essential (primary) hypertension: Secondary | ICD-10-CM | POA: Diagnosis not present

## 2020-08-22 DIAGNOSIS — Z85828 Personal history of other malignant neoplasm of skin: Secondary | ICD-10-CM | POA: Insufficient documentation

## 2020-08-22 DIAGNOSIS — D649 Anemia, unspecified: Secondary | ICD-10-CM | POA: Diagnosis not present

## 2020-08-22 DIAGNOSIS — Z87891 Personal history of nicotine dependence: Secondary | ICD-10-CM | POA: Diagnosis not present

## 2020-08-22 DIAGNOSIS — C7951 Secondary malignant neoplasm of bone: Secondary | ICD-10-CM | POA: Insufficient documentation

## 2020-08-22 DIAGNOSIS — H409 Unspecified glaucoma: Secondary | ICD-10-CM | POA: Insufficient documentation

## 2020-08-22 DIAGNOSIS — E039 Hypothyroidism, unspecified: Secondary | ICD-10-CM | POA: Diagnosis not present

## 2020-08-22 LAB — CBC WITH DIFFERENTIAL/PLATELET
Abs Immature Granulocytes: 0.21 10*3/uL — ABNORMAL HIGH (ref 0.00–0.07)
Basophils Absolute: 0 10*3/uL (ref 0.0–0.1)
Basophils Relative: 0 %
Eosinophils Absolute: 0.2 10*3/uL (ref 0.0–0.5)
Eosinophils Relative: 2 %
HCT: 33.5 % — ABNORMAL LOW (ref 39.0–52.0)
Hemoglobin: 9.9 g/dL — ABNORMAL LOW (ref 13.0–17.0)
Immature Granulocytes: 2 %
Lymphocytes Relative: 9 %
Lymphs Abs: 0.9 10*3/uL (ref 0.7–4.0)
MCH: 28.4 pg (ref 26.0–34.0)
MCHC: 29.6 g/dL — ABNORMAL LOW (ref 30.0–36.0)
MCV: 96.3 fL (ref 80.0–100.0)
Monocytes Absolute: 0.8 10*3/uL (ref 0.1–1.0)
Monocytes Relative: 7 %
Neutro Abs: 8.3 10*3/uL — ABNORMAL HIGH (ref 1.7–7.7)
Neutrophils Relative %: 80 %
Platelets: 219 10*3/uL (ref 150–400)
RBC: 3.48 MIL/uL — ABNORMAL LOW (ref 4.22–5.81)
RDW: 17.4 % — ABNORMAL HIGH (ref 11.5–15.5)
WBC: 10.4 10*3/uL (ref 4.0–10.5)
nRBC: 0 % (ref 0.0–0.2)

## 2020-08-22 LAB — COMPREHENSIVE METABOLIC PANEL
ALT: 19 U/L (ref 0–44)
AST: 23 U/L (ref 15–41)
Albumin: 3 g/dL — ABNORMAL LOW (ref 3.5–5.0)
Alkaline Phosphatase: 98 U/L (ref 38–126)
Anion gap: 10 (ref 5–15)
BUN: 13 mg/dL (ref 8–23)
CO2: 25 mmol/L (ref 22–32)
Calcium: 8.4 mg/dL — ABNORMAL LOW (ref 8.9–10.3)
Chloride: 99 mmol/L (ref 98–111)
Creatinine, Ser: 0.9 mg/dL (ref 0.61–1.24)
GFR, Estimated: >60 mL/min (ref >60)
Glucose, Bld: 108 mg/dL — ABNORMAL HIGH (ref 70–99)
Potassium: 3.8 mmol/L (ref 3.5–5.1)
Sodium: 134 mmol/L — ABNORMAL LOW (ref 135–145)
Total Bilirubin: 0.6 mg/dL (ref 0.3–1.2)
Total Protein: 6.4 g/dL — ABNORMAL LOW (ref 6.5–8.1)

## 2020-08-22 LAB — PSA: Prostatic Specific Antigen: 1.1 ng/mL (ref 0.00–4.00)

## 2020-08-22 MED ORDER — DRONABINOL 5 MG PO CAPS: 5.0000 mg | ORAL_CAPSULE | Freq: Two times a day (BID) | ORAL | 1 refills | Status: AC

## 2020-08-22 NOTE — Patient Instructions (Signed)
[place] [facility] Discharge Instructions  You were seen today by Dr. Delton Coombes. He went over your recent results and scans. Stop taking the Zytiga and finish all of your prednisone. You will be scheduled for a PET scan prior to next visit. You will be prescribed Marinol 5 mg to take twice daily to improve your appetite. Drink 4-5 cans of Boost/Ensure Plus daily to improve your weight and energy levels. Dr. Delton Coombes will see you back after the PET scan for follow up.   Thank you for choosing [place] at [facility] to provide your oncology and hematology care.  To afford each patient quality time with our provider, please arrive at least 15 minutes before your scheduled appointment time.   If you have a lab appointment with the [facility] please come in thru the Main Entrance and check in at the main information desk  You need to re-schedule your appointment should you arrive 10 or more minutes late.  We strive to give you quality time with our providers, and arriving late affects you and other patients whose appointments are after yours.  Also, if you no show three or more times for appointments you may be dismissed from the clinic at the providers discretion.     Again, thank you for choosing [facility].  Our hope is that these requests will decrease the amount of time that you wait before being seen by our physicians.       _____________________________________________________________  Should you have questions after your visit to [facility], please contact our office at [phone] between the hours of 8:00 a.m. and 4:30 p.m.  Voicemails left after 4:00 p.m. will not be returned until the following business day.  For prescription refill requests, have your pharmacy contact our office and allow 72 hours.    Cancer Center Support Programs:   > Cancer Support Group  2nd Tuesday of the month 1pm-2pm,  [facility]our[facility] Room

## 2020-08-22 NOTE — Progress Notes (Signed)
[facility] 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    CANCER STAGING: Cancer Staging No matching staging information was found for the patient.  INTERVAL HISTORY:  [name], a 87 y.o. male, returns for routine follow-up of his metastatic castration-sensitive prostate cancer. Hargis was last seen on 06/14/2020.   Today he is accompanied by his son and he reports feeling okay. He went to [facility] on 1/15 after having low blood pressure and no appetite; he was still taking Xarelto when he went to the ED. He has not taken any of his BP meds or Zytiga since leaving on 1/18. He was reportedly bleeding in his urine for several weeks prior to showing up at the ED. He is still taking a prednisone. He denies feeling dizzy. His appetite is decreased and slowly improving since discharge; his energy levels have also been low. He is drinking 1 can of Ensure daily. He had 1 episode of syncope right after discharge but has not had any more since. He denies having any new cough. He denies having any more hematuria. He is applying the Lidoderm patches and takes tramadol as needed for his pain.  He has a follow-up appointment with urology on 1/25. He has a home nurse that comes to his home twice a week and PT is supposed to come soon.   REVIEW OF SYSTEMS:  Review of Systems  Constitutional: Positive for appetite change (25%) and fatigue (25%).  Respiratory: Negative for cough.   Genitourinary: Negative for hematuria.   Neurological: Negative for dizziness.  All other systems reviewed and are  negative.   PAST MEDICAL/SURGICAL HISTORY:  Past Medical History:  Diagnosis Date  . A-fib (HCC)   . Cancer (HCC)    skin cancer  . Cancer [facility] [place] [facility])    prostate  . Glaucoma 11/17/2019  . Gout 11/17/2019  . Hypertension   . Hypothyroidism 11/17/2019   Past Surgical History:  Procedure Laterality Date  . CYSTOSCOPY N/A 08/14/2020   Procedure: Cystoscopy with clot evacuation, bladder mass biopsy, fulguration, insertion of foley catheter for continuous irrigation;  Surgeon: Rene Paci, MD;  Location: [facility] ORS;  Service: Urology;  Laterality: N/A;  . ESOPHAGOGASTRODUODENOSCOPY  03/2018  . TRANSURETHRAL RESECTION OF PROSTATE  04/21/2019    SOCIAL HISTORY:  Social History   Socioeconomic History  . Marital status: Widowed    Spouse name: Not on file  . Number of children: 2  . Years of education: Not on file  . Highest education level: Not on file  Occupational History  . Occupation: Retired  Tobacco Use  . Smoking status: Former Smoker    Packs/day: 0.50    Types: Cigarettes  . Smokeless tobacco: Never Used  Vaping Use  . Vaping Use: Never used  Substance and Sexual Activity  . Alcohol use: Yes    Alcohol/week: 1.0 standard drink    Types: 1 Glasses of wine per week    Comment: occ  . Drug use: Never  . Sexual activity: Not Currently  Other Topics Concern  . Not on file  Social History Narrative  .  Not on file   Social Determinants of Health   Financial Resource Strain: Low Risk   . Difficulty of Paying Living Expenses: Not hard at all  Food Insecurity: No Food Insecurity  . Worried About Charity fundraiser in the Last Year: Never true  . Ran Out of Food in the Last Year: Never true  Transportation Needs: No Transportation Needs  . Lack of Transportation (Medical): No  . Lack of Transportation (Non-Medical): No  Physical Activity: Insufficiently Active  . Days of Exercise per Week: 3 days  . Minutes of Exercise per Session: 30 min  Stress: No Stress  Concern Present  . Feeling of Stress : Not at all  Social Connections: Moderately Isolated  . Frequency of Communication with Friends and Family: More than three times a week  . Frequency of Social Gatherings with Friends and Family: More than three times a week  . Attends Religious Services: More than 4 times per year  . Active Member of Clubs or Organizations: No  . Attends Archivist Meetings: Never  . Marital Status: Widowed  Intimate Partner Violence: Not At Risk  . Fear of Current or Ex-Partner: No  . Emotionally Abused: No  . Physically Abused: No  . Sexually Abused: No    FAMILY HISTORY:  Family History  Problem Relation Age of Onset  . Heart attack Mother   . Stroke Father   . Heart attack Sister   . Lung cancer Brother     CURRENT MEDICATIONS:  Current Outpatient Medications  Medication Sig Dispense Refill  . abiraterone acetate (ZYTIGA) 250 MG tablet Take 3 tablets (750 mg total) by mouth daily. Take on an empty stomach 1 hour before or 2 hours after a meal 90 tablet 2  . acetaminophen (TYLENOL) 500 MG tablet Take 1,000 mg by mouth daily as needed for fever (for pain.).     Marland Kitchen allopurinol (ZYLOPRIM) 300 MG tablet Take 300 mg by mouth daily.    . cholecalciferol (VITAMIN D3) 25 MCG (1000 UNIT) tablet Take 1,000 Units by mouth daily.    Marland Kitchen docusate sodium (COLACE) 100 MG capsule Take 100 mg by mouth daily as needed for mild constipation.    Marland Kitchen dronabinol (MARINOL) 5 MG capsule Take 1 capsule (5 mg total) by mouth 2 (two) times daily before a meal. 60 capsule 1  . gabapentin (NEURONTIN) 100 MG capsule Take 100 mg by mouth 3 (three) times daily.    Marland Kitchen lactose free nutrition (BOOST) LIQD Take 237 mLs by mouth 2 (two) times daily between meals. Indefinitely. 15000 mL 3  . levothyroxine (SYNTHROID) 50 MCG tablet Take 50 mcg by mouth daily before breakfast.    . lidocaine (LIDODERM) 5 % Place 1 patch onto the skin daily. Remove & Discard patch within 12 hours or as  directed by MD 30 patch 0  . Multiple Vitamin (MULTIVITAMIN WITH MINERALS) TABS tablet Take 1 tablet by mouth daily.    . ondansetron (ZOFRAN ODT) 4 MG disintegrating tablet Take 1 tablet (4 mg total) by mouth every 8 (eight) hours as needed for nausea or vomiting. 20 tablet 0  . pantoprazole (PROTONIX) 40 MG tablet Take 40 mg by mouth daily.     . predniSONE (DELTASONE) 5 MG tablet Take 1 tablet (5 mg total) by mouth daily with breakfast. 30 tablet 6  . timolol (BETIMOL) 0.5 % ophthalmic solution Place 1 drop into the left eye in the morning.    . traMADol (ULTRAM) 50 MG  tablet Take 1-2 tablets (50-100 mg total) by mouth every 6 (six) hours as needed. 60 tablet 0   No current facility-administered medications for this visit.    ALLERGIES:  Allergies  Allergen Reactions  . Oxycodone Nausea And Vomiting    PHYSICAL EXAM:  Performance status (ECOG): 1 - Symptomatic but completely ambulatory  Vitals:   08/22/20 1444  BP: (!) 87/59  Pulse: 94  Resp: 20  Temp: (!) 97 F (36.1 C)  SpO2: 100%   Wt Readings from Last 3 Encounters:  08/13/20 180 lb (81.6 kg)  06/14/20 217 lb (98.4 kg)  05/24/20 210 lb 4.8 oz (95.4 kg)   Physical Exam Vitals reviewed.  Constitutional:      Appearance: Normal appearance.     Comments: In wheelchair  Neurological:     General: No focal deficit present.     Mental Status: He is alert and oriented to person, place, and time.  Psychiatric:        Mood and Affect: Mood normal.        Behavior: Behavior normal.      LABORATORY DATA:  I have reviewed the labs as listed.  CBC Latest Ref Rng & Units 08/22/2020 08/16/2020 08/15/2020  WBC 4.0 - 10.5 K/uL 10.4 15.5(H) 18.1(H)  Hemoglobin 13.0 - 17.0 g/dL 9.9(L) 7.3(L) 7.2(L)  Hematocrit 39.0 - 52.0 % 33.5(L) 23.2(L) 22.6(L)  Platelets 150 - 400 K/uL 219 167 280   CMP Latest Ref Rng & Units 08/22/2020 08/16/2020 08/15/2020  Glucose 70 - 99 mg/dL 108(H) 94 125(H)  BUN 8 - 23 mg/dL 13 18 19   Creatinine  0.61 - 1.24 mg/dL 0.90 1.00 1.11  Sodium 135 - 145 mmol/L 134(L) 138 136  Potassium 3.5 - 5.1 mmol/L 3.8 4.0 4.1  Chloride 98 - 111 mmol/L 99 105 106  CO2 22 - 32 mmol/L 25 24 20(L)  Calcium 8.9 - 10.3 mg/dL 8.4(L) 8.0(L) 8.0(L)  Total Protein 6.5 - 8.1 g/dL 6.4(L) - -  Total Bilirubin 0.3 - 1.2 mg/dL 0.6 - -  Alkaline Phos 38 - 126 U/L 98 - -  AST 15 - 41 U/L 23 - -  ALT 0 - 44 U/L 19 - -    DIAGNOSTIC IMAGING:  I have independently reviewed the scans and discussed with the patient. DG Pelvis 1-2 Views  Result Date: 08/13/2020 CLINICAL DATA:  Status post fall x2 yesterday.  Syncopal episodes. EXAM: PELVIS - 1-2 VIEW COMPARISON:  None. FINDINGS: No acute bony or joint abnormality is identified. Evaluation of the right hip is limited as the patient is externally rotated. IMPRESSION: No acute finding. Evaluation of the right hip is limited by external rotation. Electronically Signed   By: Inge Rise M.D.   On: 08/13/2020 14:19   CT Head Wo Contrast  Result Date: 08/13/2020 CLINICAL DATA:  Dizziness. Syncopal episode. Fell twice yesterday. Hypotension. EXAM: CT HEAD WITHOUT CONTRAST TECHNIQUE: Contiguous axial images were obtained from the base of the skull through the vertex without intravenous contrast. COMPARISON:  None. FINDINGS: Brain: No evidence of acute infarction, hemorrhage, hydrocephalus, extra-axial collection or mass lesion/mass effect. There is mild ventricular sulcal enlargement reflecting age-appropriate volume loss. Patchy areas of white matter hypoattenuation are also noted consistent with mild chronic microvascular ischemic change. Vascular: No hyperdense vessel or unexpected calcification. Skull: Normal. Negative for fracture or focal lesion. Sinuses/Orbits: Globes and orbits are unremarkable. Sinuses are clear. Other: None. IMPRESSION: 1. No acute intracranial abnormalities. 2. Age-appropriate volume loss and mild chronic microvascular ischemic  change. Electronically  Signed   By: Lajean Manes M.D.   On: 08/13/2020 16:08   DG Chest Portable 1 View  Result Date: 07/31/2020 CLINICAL DATA:  Weakness EXAM: PORTABLE CHEST 1 VIEW COMPARISON:  11/17/2019, 09/04/2019, 09/01/2019, PET CT 11/10/2019 FINDINGS: Coarse mildly reticular diffuse pulmonary opacity likely due to chronic disease. No acute consolidation or effusion. Stable cardiomediastinal silhouette with borderline cardiomegaly and aortic atherosclerosis. 14 mm rounded opacity in the right upper lung and 14 mm nodular opacity in the left mid lung. IMPRESSION: 1. No acute pulmonary infiltrate or edema. 2. Probable underlying chronic interstitial lung disease. Possible bilateral lung nodules. Chest CT is recommended for further evaluation. Electronically Signed   By: Donavan Foil M.D.   On: 07/31/2020 16:39   CT Renal Stone Study  Result Date: 08/13/2020 CLINICAL DATA:  Hematuria. Syncopal episode yesterday with falls. Urinary blood clots. Being treated for prostate cancer. Right hip pain and hematuria. EXAM: CT ABDOMEN AND PELVIS WITHOUT CONTRAST TECHNIQUE: Multidetector CT imaging of the abdomen and pelvis was performed following the standard protocol without IV contrast. COMPARISON:  11/17/2019 FINDINGS: Lower chest: Numerous new nodules within the lung bases right worse than left with the largest measuring 1.4 cm over the medial right lower lobe. Small left pleural effusion with minimal associated atelectasis. Calcified plaque over the right coronary artery and descending thoracic aorta. Hepatobiliary: Liver is somewhat small but unchanged without focal mass. Gallbladder and biliary tree are normal. Pancreas: Normal. Spleen: Normal. Adrenals/Urinary Tract: Adrenal glands are normal. Kidneys are normal in size with minimal stable prominence of the left intrarenal collecting system. No renal stones are present. Exophytic cyst over the upper pole right kidney unchanged. Mild right-sided hydronephrosis with dilatation of  the right renal pelvis and mild dilatation of the right ureter due to progressive enlargement of soft tissue mass over the right pelvic sidewall causing destruction of the adjacent right posterior acetabulum. This is compatible with known metastatic disease. Left ureter is normal. There is moderate hyperdense material filling just over half the bladder likely hemorrhagic material. Stomach/Bowel: Stomach and small bowel are normal. Appendix is not visualized. Mild diverticulosis of the colon. Vascular/Lymphatic: Calcified plaque over the abdominal aorta which is normal in caliber. Significant improvement in the previously seen bulky periaortic and iliac chain adenopathy. Reproductive: Evidence of previous prostatectomy. Other: Interval progression of large irregular mass over the right pelvic sidewall measuring approximately 7.2 x 9.2 cm. This mass causes destruction along the right posterior acetabulum unchanged. This mass abuts and appears to invade the adjacent bladder at the right UVJ it extends into the surgical bed of the prostate gland. The mass abuts the right anterolateral aspect of the rectum contacting 90 degrees of the circumference. This is compatible progression of patient's known metastatic disease. Musculoskeletal: Mild interval progression of diffuse osseous metastatic disease. IMPRESSION: 1. Evidence of patient's known metastatic prostate cancer with interval progression of patient's known large irregular mass over the right pelvic sidewall measuring approximately 7.2 x 9.2 cm causing destruction of the right posterior acetabulum. This mass abuts and appears to invade the adjacent bladder at the right UVJ with moderate hyperdense material filling half the bladder likely hemorrhagic debris. This mass also causes a degree of obstruction with mild right-sided hydroureteronephrosis. The mass abuts the right anterolateral aspect of the rectum. Numerous new pulmonary nodules within the lung bases right  worse than left compatible with metastatic disease. Small left pleural effusion with minimal associated atelectasis. Interval progression of known osseous metastatic disease. 2.  Stable right renal cyst. 3. Mild colonic diverticulosis. 4. Aortic atherosclerosis.  Atherosclerotic coronary artery disease. Aortic Atherosclerosis (ICD10-I70.0). Electronically Signed   By: Elberta Fortisaniel  Boyle M.D.   On: 08/13/2020 16:27   DG Femur Min 2 Views Right  Result Date: 08/13/2020 CLINICAL DATA:  Syncopal episode yesterday with fall. Right leg pain. EXAM: RIGHT FEMUR 2 VIEWS COMPARISON:  None. FINDINGS: There is no evidence of fracture or other focal bone lesions. Minimal degenerative change of the right hip. Soft tissues are unremarkable. IMPRESSION: No acute findings. Electronically Signed   By: Elberta Fortisaniel  Boyle M.D.   On: 08/13/2020 14:19     ASSESSMENT:  1. 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 [place] around [place] 2020 with prostate cancer. -Presentation with low back pain to the ER on 10/21/2019 with MRI of the lumbar spine showing bone metastasis involving L2, T12 vertebral bodies with no extraosseous tumor. Extensive bulky retroperitoneal adenopathy. -PSA was 390. PET scan on 11/10/2019 showed nodal metastasis involving chest, abdomen and pelvis. Bone metastasis including destructive soft tissue mass involving right posterior column of the acetabulum. -Left external iliac lymph node biopsy on 11/18/2019 consistent with prostatic adenocarcinoma. -Firmagon started on 11/24/2019. Abiraterone with prednisone started around 12/10/2019.   PLAN:  1. Metastatic castration sensitive prostate cancer to the bones and lymph nodes: -Recent hospitalization from 08/13/2020 through 08/16/2020 with hematuria, severe weakness and fall. -CT renal study on 08/13/2020 shows large irregular mass over the right pelvic sidewall appears to invade adjacent bladder at the  right UVJ with moderate hyperdense material filling half of the bladder. Mass also causes degree of obstruction with mild right-sided hydroureteronephrosis. Numerous new pulmonary nodules within the lung bases right worse than left. Small left pleural effusion. -Cystoscopy by Dr. Sande BrothersWinters on 08/14/2020 with mass in the right lateral aspect of the bladder with complete involvement of right ureteral orifice. -I reviewed the biopsy results which showed necrotic material with no active malignancy. -He is severely weak and is spending most of the time in the recliner. -He has follow-up with urology tomorrow. -His PSA level from today is pending. We will follow up on it. If the PSA is normal, will consider PET scan for evaluation of other malignancies. -RTC after PET scan. If continues to deteriorate, will consider supportive care with hospice. -He has been off of Abiraterone since hospitalization. Continue prednisone 5 mg daily. We will hold off on denosumab today.  2. Low back pain: -MRI thoracic and lumbar spine on 06/08/2020 did not show any epidural involvement. Lesions in the T12 left pedicle, T12 superior endplate, T9 vertebral body, L2 body, L4-L5 and S2 vertebral bodies. Similar multilevel degenerative change with moderate to severe central canal and severe bilateral subarticular recess stenosis at L4-L5. Broad-based disc bulge at L4-L5. -He reports that pain has not improved even after he received injection in his back. -Recommend continuing tramadol as needed. If it does not help, will consider hydrocodone or oxycodone.   3. Atrial fibrillation: -Xarelto was discontinued secondary to hematuria during recent hospitalization.  4. Bone metastasis: -Hold off on denosumab today. Calcium is 8.4.  5. Loss of appetite: -Start Marinol 5 mg twice daily.  6. Normocytic anemia: -Hemoglobin today improved to 9.9 from 7.3 at the time of discharge.   Orders placed this encounter:  Orders  Placed This Encounter  Procedures  . NM PET Image Restag (PS) Skull Base To Thigh   Total time spent is 40 minutes with more  than 50% of the time spent face-to-face, additional time spent reviewing medical records from recent hospitalization, counseling and coordination of care.  Derek Jack, MD [place] [phone]   I, Milinda Antis, am acting as a scribe for Dr. Sanda Linger.  I, Derek Jack MD, have reviewed the above documentation for accuracy and completeness, and I agree with the above.

## 2020-08-22 NOTE — Progress Notes (Signed)
Patient assessed and labs reviewed by Dr. Delton Coombes. Holding Xgeva injection today. Primary nurse aware.

## 2020-08-23 ENCOUNTER — Ambulatory Visit: Payer: Medicare Other | Admitting: Urology

## 2020-08-31 ENCOUNTER — Other Ambulatory Visit (HOSPITAL_COMMUNITY): Payer: Self-pay

## 2020-08-31 DIAGNOSIS — C7951 Secondary malignant neoplasm of bone: Secondary | ICD-10-CM

## 2020-08-31 DIAGNOSIS — Z191 Hormone sensitive malignancy status: Secondary | ICD-10-CM

## 2020-08-31 DIAGNOSIS — C61 Malignant neoplasm of prostate: Secondary | ICD-10-CM

## 2020-08-31 NOTE — Progress Notes (Signed)
Call from daughter in law requesting that patient have PET scan be done at [facility]. Order to be faxed over.

## 2020-09-01 MED FILL — ABIRATERONE ACETATE 250 MG: 250 | 30 days supply | Qty: 90 | Fill #3

## 2020-09-02 ENCOUNTER — Encounter (HOSPITAL_COMMUNITY): Payer: Medicare Other

## 2020-09-06 ENCOUNTER — Ambulatory Visit (HOSPITAL_COMMUNITY): Payer: Medicare Other | Admitting: Hematology

## 2020-09-14 ENCOUNTER — Other Ambulatory Visit: Payer: Self-pay

## 2020-09-14 ENCOUNTER — Inpatient Hospital Stay (HOSPITAL_COMMUNITY): Payer: Medicare Other | Attending: Hematology and Oncology | Admitting: Hematology

## 2020-09-14 DIAGNOSIS — C61 Malignant neoplasm of prostate: Secondary | ICD-10-CM

## 2020-09-14 DIAGNOSIS — C7951 Secondary malignant neoplasm of bone: Secondary | ICD-10-CM | POA: Diagnosis not present

## 2020-09-14 NOTE — Progress Notes (Signed)
Virtual Visit via Telephone Note  I connected with Steven Gutierrez on 09/14/20 at  4:00 PM EST by telephone and verified that I am speaking with the correct person using two identifiers.  Location: Patient: At home Provider: In the office   I discussed the limitations, risks, security and privacy concerns of performing an evaluation and management service by telephone and the availability of in person appointments. I also discussed with the patient that there may be a patient responsible charge related to this service. The patient expressed understanding and agreed to proceed.   History of Present Illness: He is seen in our clinic for metastatic prostate cancer to the bones and lymph nodes.  He was on Lupron and Abiraterone was discontinued secondary to weakness.   Observations/Objective: I talked to the patient's son Chidi Shirer.  Patient apparently deteriorated quickly over the weekend and was enrolled in hospice.  Assessment and Plan:  1.  Metastatic prostate cancer: -Last Lupron was on 06/21/2020. -Zytiga was discontinued during his recent hospitalization in January. -Last PSA on 08/22/2020 was 1.1.  I have reviewed the results of the PET scan from 09/09/2020.  This was done in [place].  This showed large mass involving the posterior aspect of the bladder extending into the right pelvic sidewall and through the right ischio rectal fossa and invading through the right ischium with erosion.  This measures 10 x 9 cm.  There is moderate right hydronephrosis.  Numerous bony sclerotic lesions, largest involving L4 vertebral body measuring 3.2 cm.  Numerous bilateral pulmonary masses, largest in the anterior aspect of the lingula measuring 3.2 cm.  Enlarged left common iliac lymph node 16 mm. His PSA when he was diagnosed with metastatic prostate cancer was 390 and has improved to 0.24.  Latest PSA was 1.1 while he was off of Abiraterone. -The differential diagnosis for the mass is either new  bladder cancer metastatic to the bones and lungs versus small cell transformation of the prostate cancer. Due to quick deterioration, he was placed in hospice which is quite appropriate.  Follow Up Instructions: RTC as needed.   I discussed the assessment and treatment plan with the patient. The patient was provided an opportunity to ask questions and all were answered. The patient agreed with the plan and demonstrated an understanding of the instructions.   The patient was advised to call back or seek an in-person evaluation if the symptoms worsen or if the condition fails to improve as anticipated.  I provided 11 minutes of non-face-to-face time during this encounter.   Derek Jack, MD

## 2020-09-27 DEATH — deceased

## 2020-10-20 ENCOUNTER — Other Ambulatory Visit (HOSPITAL_BASED_OUTPATIENT_CLINIC_OR_DEPARTMENT_OTHER): Payer: Self-pay

## 2021-10-29 ENCOUNTER — Encounter (HOSPITAL_COMMUNITY): Payer: Self-pay | Admitting: Hematology

## 2021-12-23 IMAGING — DX DG CHEST 1V PORT
1 series · 1 of 1 positions shown · non-contrast
Comparison: 09/01/2019

CLINICAL DATA: Dyspnea, history of fever

EXAM:
PORTABLE CHEST 1 VIEW

[chest]
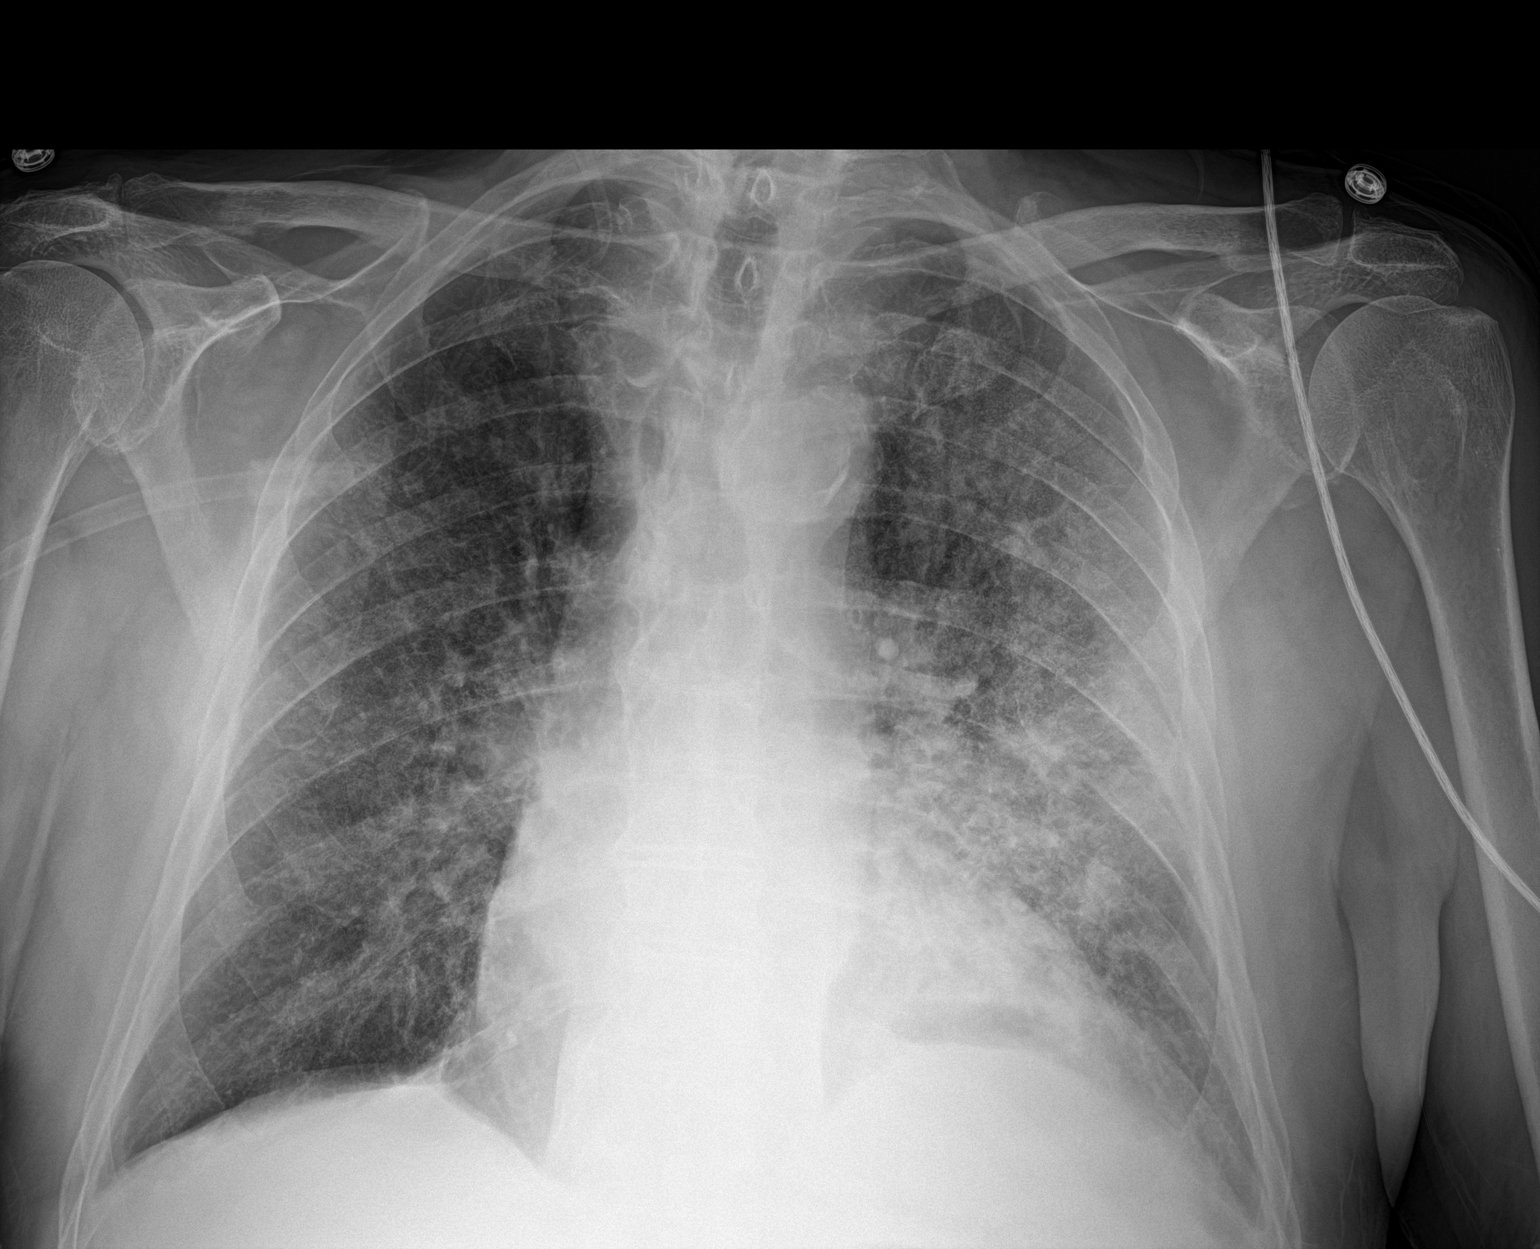

[1 of 1 positions shown; findings below may reference images not displayed]

FINDINGS: Single frontal view of the chest demonstrates a stable cardiac
silhouette. The dense areas of consolidation seen within the
perihilar regions previously have been replaced by diffuse
interstitial and ground-glass opacities, now most pronounced in the
left infrahilar region. No effusion or pneumothorax. No acute bony
abnormalities.
IMPRESSION: 1. Diffuse interstitial and ground-glass opacities as above.
Appearance would favor atypical viral pneumonia.

## 2022-03-07 IMAGING — DX DG CHEST 2V
2 series · 2 of 2 positions shown · non-contrast
Comparison: 09/04/2019

CLINICAL DATA: Possible sepsis

EXAM:
CHEST - 2 VIEW

[chest pa]
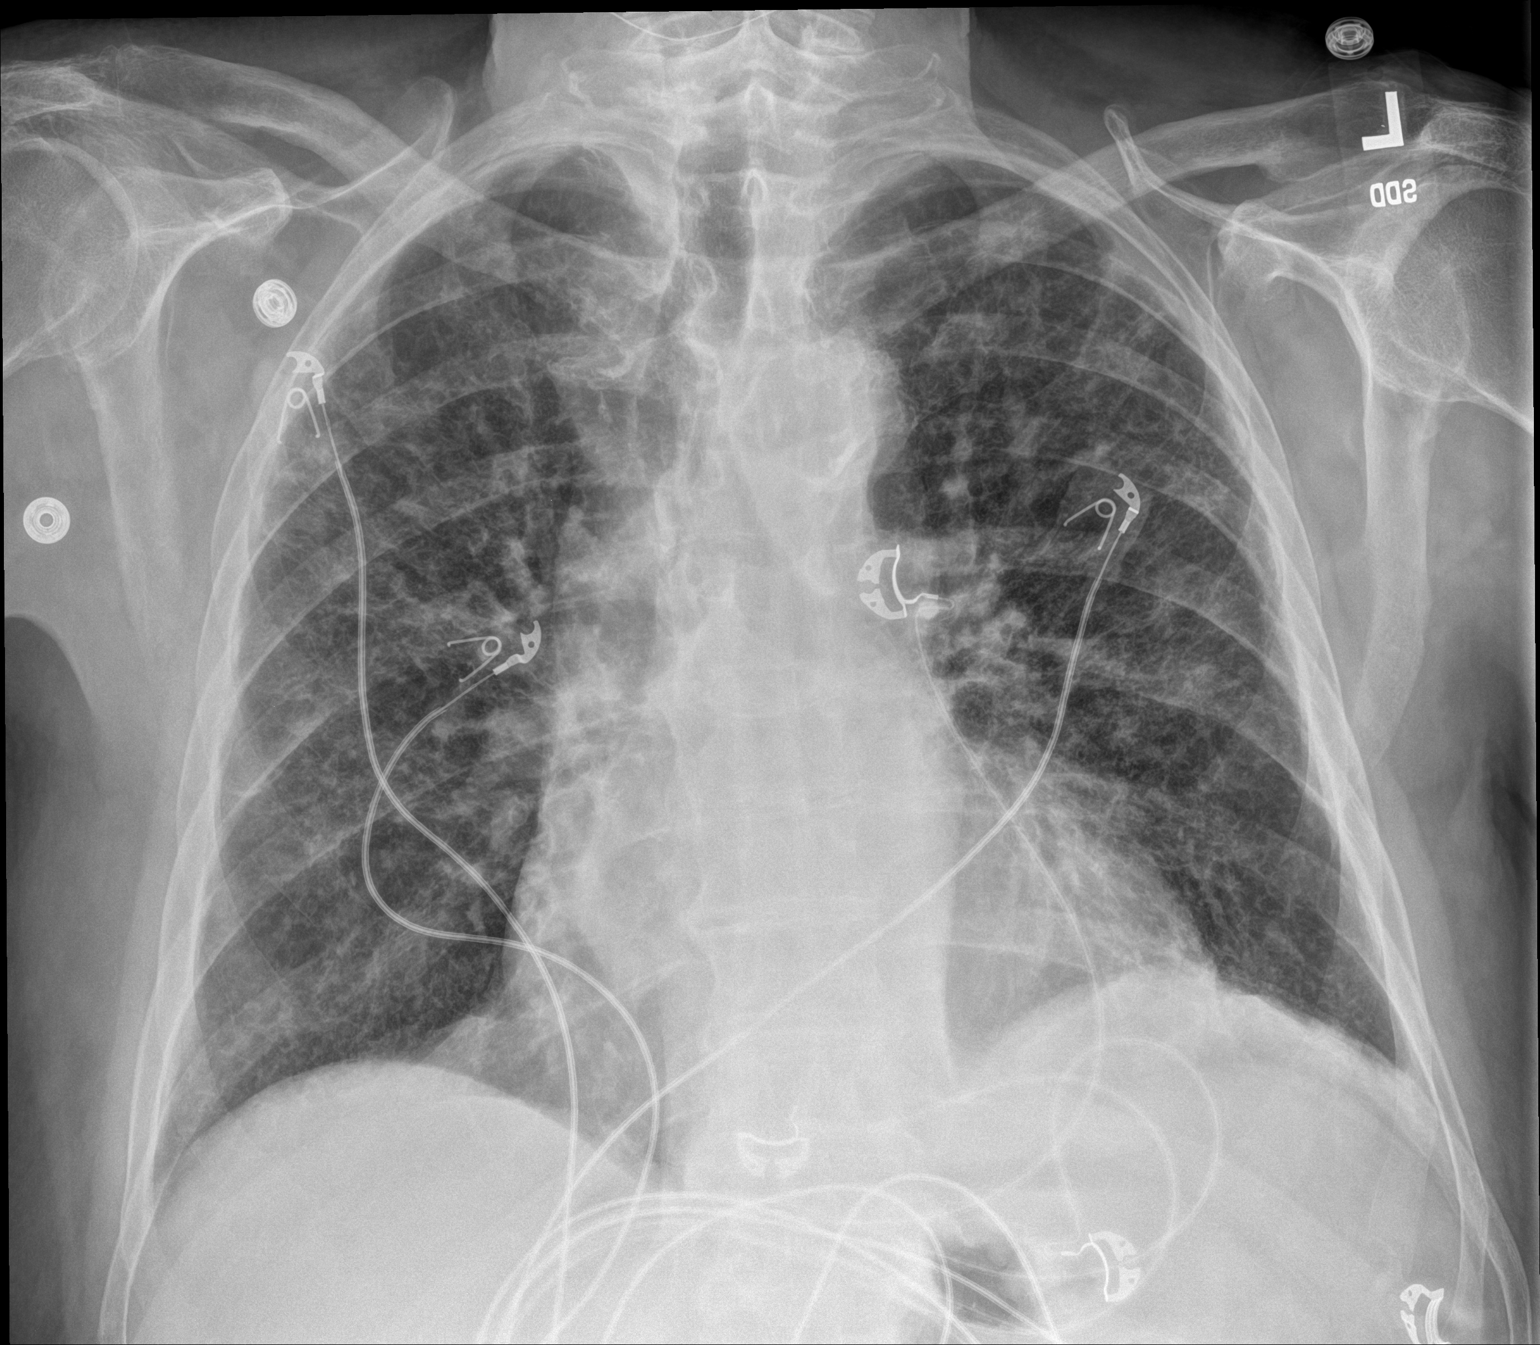

[chest lat]
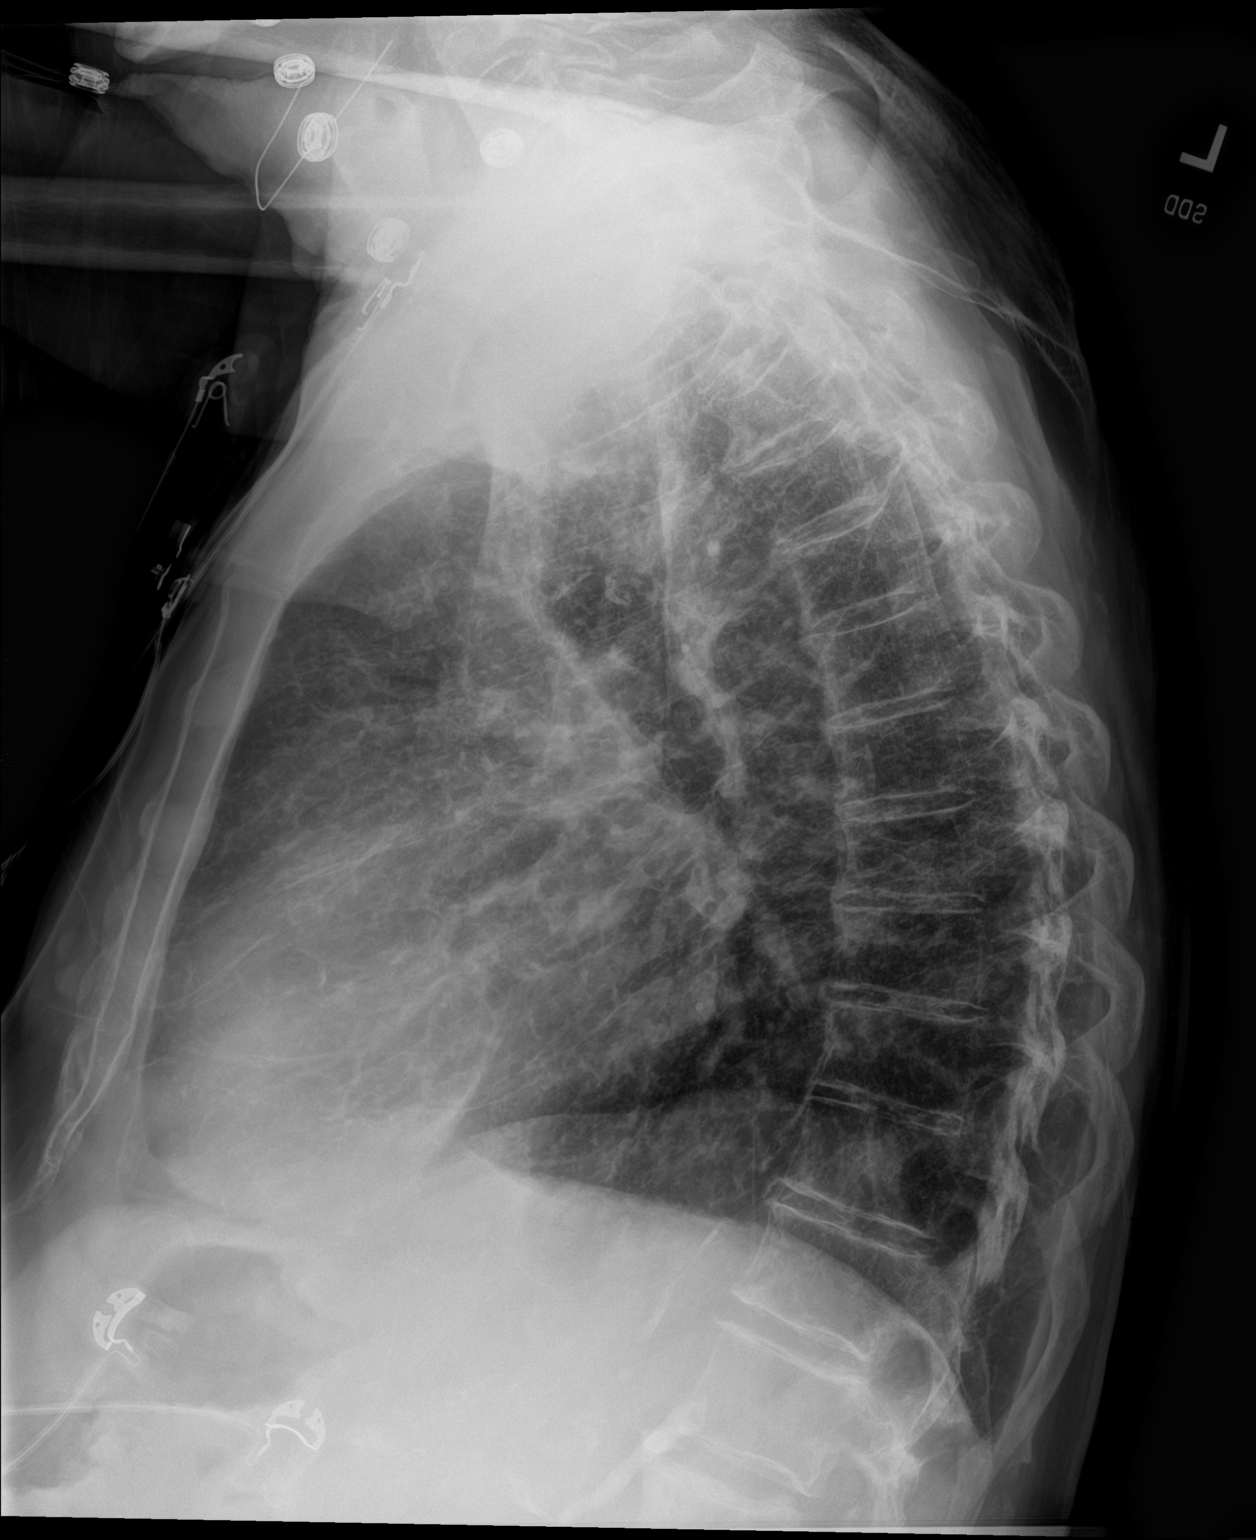

[2 of 2 positions shown; findings below may reference images not displayed]

FINDINGS: Diffuse bilateral interstitial and somewhat nodular appearing
opacities. No pleural effusion. Cardiomegaly with aortic
atherosclerosis. No pneumothorax.
IMPRESSION: Probable underlying chronic interstitial disease. Diffuse bilateral
interstitial and nodular opacities suspect for acute superimposed
infectious or inflammatory process, possible atypical or viral
pneumonia.

## 2022-11-19 IMAGING — DX DG CHEST 1V PORT
1 series · 1 of 1 positions shown · non-contrast
Comparison: 11/17/2019, 09/04/2019, 09/01/2019, PET CT 11/10/2019

CLINICAL DATA: Weakness

EXAM:
PORTABLE CHEST 1 VIEW

[chest ap]
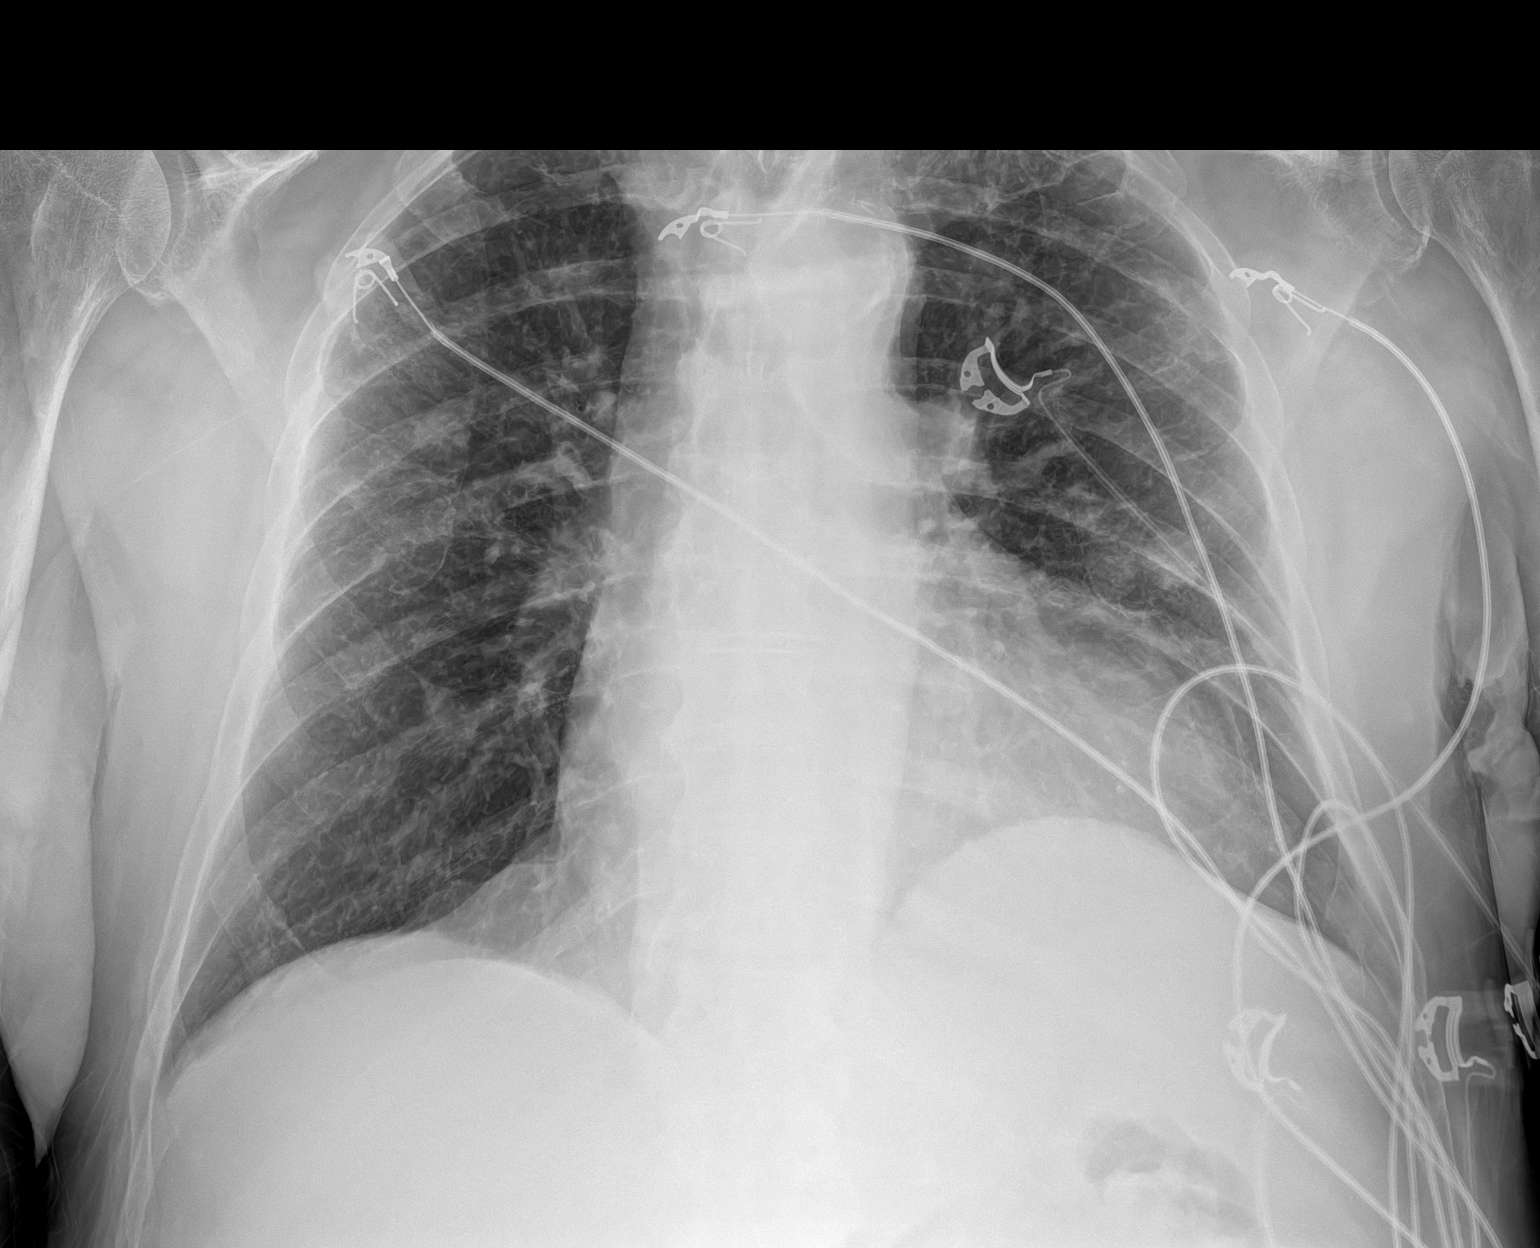

[1 of 1 positions shown; findings below may reference images not displayed]

FINDINGS: Coarse mildly reticular diffuse pulmonary opacity likely due to
chronic disease. No acute consolidation or effusion. Stable
cardiomediastinal silhouette with borderline cardiomegaly and aortic
atherosclerosis. 14 mm rounded opacity in the right upper lung and
14 mm nodular opacity in the left mid lung.
IMPRESSION: 1. No acute pulmonary infiltrate or edema.
2. Probable underlying chronic interstitial lung disease. Possible
bilateral lung nodules. Chest CT is recommended for further
evaluation.
